# Patient Record
Sex: Male | Born: 1950
Health system: Southern US, Community
[De-identification: ages and names within clinical notes are randomized; demographics above are authoritative.]

## PROBLEM LIST (undated history)

## (undated) DIAGNOSIS — I10 Essential (primary) hypertension: Secondary | ICD-10-CM

## (undated) DIAGNOSIS — H3530 Unspecified macular degeneration: Secondary | ICD-10-CM

## (undated) DIAGNOSIS — L409 Psoriasis, unspecified: Secondary | ICD-10-CM

## (undated) DIAGNOSIS — T7840XA Allergy, unspecified, initial encounter: Secondary | ICD-10-CM

## (undated) DIAGNOSIS — K5190 Ulcerative colitis, unspecified, without complications: Secondary | ICD-10-CM

## (undated) DIAGNOSIS — H269 Unspecified cataract: Secondary | ICD-10-CM

## (undated) DIAGNOSIS — B019 Varicella without complication: Secondary | ICD-10-CM

## (undated) HISTORY — DX: Unspecified cataract: H26.9

## (undated) HISTORY — PX: ACNE CYST REMOVAL: SUR1112

## (undated) HISTORY — DX: Varicella without complication: B01.9

## (undated) HISTORY — DX: Unspecified macular degeneration: H35.30

## (undated) HISTORY — DX: Ulcerative colitis, unspecified, without complications: K51.90

## (undated) HISTORY — DX: Essential (primary) hypertension: I10

## (undated) HISTORY — DX: Allergy, unspecified, initial encounter: T78.40XA

## (undated) HISTORY — DX: Psoriasis, unspecified: L40.9

## (undated) HISTORY — PX: COLONOSCOPY: SHX174

## (undated) HISTORY — PX: TONSILLECTOMY: SHX5217

## (undated) HISTORY — PX: EYE SURGERY: SHX253

---

## 1986-11-14 HISTORY — PX: FOOT SURGERY: SHX648

## 2001-09-30 ENCOUNTER — Emergency Department (HOSPITAL_COMMUNITY): Admission: EM | Admit: 2001-09-30 | Discharge: 2001-10-01 | Payer: Self-pay | Admitting: Emergency Medicine

## 2004-10-21 ENCOUNTER — Ambulatory Visit: Payer: Self-pay | Admitting: Internal Medicine

## 2005-06-28 ENCOUNTER — Ambulatory Visit: Payer: Self-pay | Admitting: Internal Medicine

## 2005-07-05 ENCOUNTER — Ambulatory Visit: Payer: Self-pay | Admitting: Internal Medicine

## 2005-07-15 ENCOUNTER — Ambulatory Visit: Payer: Self-pay

## 2005-11-14 HISTORY — PX: SHOULDER ARTHROSCOPY: SHX128

## 2005-12-13 ENCOUNTER — Ambulatory Visit: Payer: Self-pay | Admitting: Internal Medicine

## 2006-09-01 ENCOUNTER — Ambulatory Visit: Payer: Self-pay | Admitting: Internal Medicine

## 2006-09-01 LAB — CONVERTED CEMR LAB
AST: 18 units/L (ref 0–37)
Albumin: 4.1 g/dL (ref 3.5–5.2)
BUN: 15 mg/dL (ref 6–23)
Basophils Absolute: 0 10*3/uL (ref 0.0–0.1)
CO2: 29 meq/L (ref 19–32)
Calcium: 9.3 mg/dL (ref 8.4–10.5)
Chol/HDL Ratio, serum: 4.1
Glomerular Filtration Rate, Af Am: 112 mL/min/{1.73_m2}
Glucose, Bld: 90 mg/dL (ref 70–99)
Ketones, ur: NEGATIVE mg/dL
LDL Cholesterol: 95 mg/dL (ref 0–99)
Leukocytes, UA: NEGATIVE
Lymphocytes Relative: 13.2 % (ref 12.0–46.0)
MCV: 90.8 fL (ref 78.0–100.0)
Monocytes Absolute: 0.4 10*3/uL (ref 0.2–0.7)
Monocytes Relative: 8.4 % (ref 3.0–11.0)
Neutrophils Relative %: 77 % (ref 43.0–77.0)
Platelets: 163 10*3/uL (ref 150–400)
Potassium: 4 meq/L (ref 3.5–5.1)
Specific Gravity, Urine: >=1.03 (ref 1.000–1.03)
Total Bilirubin: 1 mg/dL (ref 0.3–1.2)
Total Protein, Urine: NEGATIVE mg/dL
Total Protein: 6.4 g/dL (ref 6.0–8.3)
Urine Glucose: NEGATIVE mg/dL
Urobilinogen, UA: 0.2 (ref 0.0–1.0)

## 2006-09-08 ENCOUNTER — Ambulatory Visit: Payer: Self-pay | Admitting: Internal Medicine

## 2007-01-24 ENCOUNTER — Ambulatory Visit: Payer: Self-pay | Admitting: Internal Medicine

## 2007-01-29 ENCOUNTER — Ambulatory Visit: Payer: Self-pay | Admitting: Internal Medicine

## 2007-08-20 ENCOUNTER — Encounter: Payer: Self-pay | Admitting: *Deleted

## 2007-08-20 DIAGNOSIS — I10 Essential (primary) hypertension: Secondary | ICD-10-CM

## 2007-08-20 DIAGNOSIS — L408 Other psoriasis: Secondary | ICD-10-CM

## 2007-08-20 DIAGNOSIS — K5190 Ulcerative colitis, unspecified, without complications: Secondary | ICD-10-CM

## 2008-02-08 ENCOUNTER — Ambulatory Visit: Payer: Self-pay | Admitting: Internal Medicine

## 2008-11-25 ENCOUNTER — Ambulatory Visit: Payer: Self-pay | Admitting: Internal Medicine

## 2008-11-25 LAB — CONVERTED CEMR LAB
ALT: 50 units/L (ref 0–53)
Basophils Absolute: 0 10*3/uL (ref 0.0–0.1)
Basophils Relative: 0.3 % (ref 0.0–3.0)
Calcium: 9.1 mg/dL (ref 8.4–10.5)
Cholesterol: 163 mg/dL (ref 0–200)
Eosinophils Relative: 1.5 % (ref 0.0–5.0)
GFR calc Af Amer: 98 mL/min
GFR calc non Af Amer: 81 mL/min
Glucose, Bld: 108 mg/dL — ABNORMAL HIGH (ref 70–99)
HCT: 45.6 % (ref 39.0–52.0)
HDL: 32.9 mg/dL — ABNORMAL LOW (ref >39.0)
Hemoglobin, Urine: NEGATIVE
LDL Cholesterol: 107 mg/dL — ABNORMAL HIGH (ref 0–99)
Leukocytes, UA: NEGATIVE
Lymphocytes Relative: 15.1 % (ref 12.0–46.0)
MCV: 87.5 fL (ref 78.0–100.0)
Monocytes Absolute: 0.7 10*3/uL (ref 0.1–1.0)
Neutro Abs: 4.7 10*3/uL (ref 1.4–7.7)
Nitrite: NEGATIVE
PSA: 2.33 ng/mL (ref 0.10–4.00)
Platelets: 141 10*3/uL — ABNORMAL LOW (ref 150–400)
Total CHOL/HDL Ratio: 5
Total Protein: 6.9 g/dL (ref 6.0–8.3)
VLDL: 24 mg/dL (ref 0–40)
WBC: 6.5 10*3/uL (ref 4.5–10.5)

## 2008-12-02 ENCOUNTER — Ambulatory Visit: Payer: Self-pay | Admitting: Internal Medicine

## 2008-12-02 DIAGNOSIS — E669 Obesity, unspecified: Secondary | ICD-10-CM

## 2009-09-11 ENCOUNTER — Ambulatory Visit: Payer: Self-pay | Admitting: Internal Medicine

## 2009-09-11 DIAGNOSIS — R05 Cough: Secondary | ICD-10-CM

## 2009-10-02 ENCOUNTER — Ambulatory Visit: Payer: Self-pay | Admitting: Internal Medicine

## 2009-10-29 ENCOUNTER — Telehealth: Payer: Self-pay | Admitting: Internal Medicine

## 2009-11-27 ENCOUNTER — Ambulatory Visit: Payer: Self-pay | Admitting: Internal Medicine

## 2009-11-27 LAB — CONVERTED CEMR LAB
ALT: 38 units/L (ref 0–53)
Alkaline Phosphatase: 65 units/L (ref 39–117)
BUN: 19 mg/dL (ref 6–23)
Bilirubin, Direct: 0.1 mg/dL (ref 0.0–0.3)
Calcium: 8.5 mg/dL (ref 8.4–10.5)
Chloride: 110 meq/L (ref 96–112)
Cholesterol: 154 mg/dL (ref 0–200)
Creatinine, Ser: 0.9 mg/dL (ref 0.40–1.50)
Glucose, Bld: 82 mg/dL (ref 70–99)
HCT: 46.6 % (ref 39.0–52.0)
HDL: 38 mg/dL — ABNORMAL LOW (ref >39)
Hemoglobin: 15.5 g/dL (ref 13.0–17.0)
Leukocytes, UA: NEGATIVE
MCV: 89.1 fL (ref 78.0–100.0)
Platelets: 127 10*3/uL — ABNORMAL LOW (ref 150.0–400.0)
Sodium: 148 meq/L — ABNORMAL HIGH (ref 135–145)
TSH: 1.99 microintl units/mL (ref 0.35–5.50)
Total CHOL/HDL Ratio: 4.1
Total Protein, Urine: NEGATIVE mg/dL
Total Protein: 7.2 g/dL (ref 6.0–8.3)
Triglycerides: 119 mg/dL (ref <150)
Urobilinogen, UA: 1 (ref 0.0–1.0)
WBC: 6.5 10*3/uL (ref 4.5–10.5)
pH: 6.5 (ref 5.0–8.0)

## 2009-12-04 ENCOUNTER — Ambulatory Visit: Payer: Self-pay | Admitting: Internal Medicine

## 2010-12-14 NOTE — Assessment & Plan Note (Signed)
Summary: CPX / NWS  #-PER WIFE SHINGLE VAC ALSO-WIFE CK'D/INSUR WILL P...   Vital Signs:  Patient profile:   60 year old male Height:      70 inches Weight:      271.75 pounds BMI:     39.13 O2 Sat:      96 % on Room air Temp:     98.3 degrees F oral Pulse rate:   96 / minute BP sitting:   160 / 104  (left arm)  Vitals Entered By: Ernestene Mention (December 04, 2009 1:25 PM)  O2 Flow:  Room air CC: CPX--Needs Zosta Vacc/ Pt denies symptoms of increased BP/occ. right heel pain ?plantar fascitis./kb Is Patient Diabetic? No Pain Assessment Patient in pain? no       Vision Screening:      Vision Comments: Last eye exam done 11-27-09 per patient./kb  Vision Entered By: Ernestene Mention (December 04, 2009 1:28 PM)   Primary Care Provider:  Neena Rhymes MD  CC:  CPX--Needs Zosta Vacc/ Pt denies symptoms of increased BP/occ. right heel pain ?plantar fascitis./kb.  History of Present Illness: Patient presents for rouinte medical follow up with last CPX 12/10/08. He did have a cold in Oct '10. He has lost 20 lbs since Jan '10. He is noted to have a bump in BP frm 140/88 last viist to 160/104 today. He feels fine.   He has had some right heel pain: worse with initial weight bearing.   He has had an otherwise uneventful year. He did have an opthal exam last month: has incrased macular protein.   Current Medications (verified): 1)  Enalapril-Hydrochlorothiazide 5-12.5 Mg  Tabs (Enalapril-Hydrochlorothiazide) .... Take 1 Tablet By Mouth Once A Day 2)  Ocuvite (Otc) .... Daily 3)  Lutein 48m (Otc) .... Daily 4)  Clobetasol Propionate 0.05 % Oint (Clobetasol Propionate) .... Apply Two Times A Day Needed  Allergies (verified): 1)  ! Sulfa  Past History:  Past Medical History: Last updated: 02010-01-27Current Problems:  HYPERTENSION (ICD-401.9) COLITIS, ULCERATIVE (ICD-556.9) PSORIASIS (ICD-696.1)  Past Surgical History: Last updated: 027-Jan-2010Tonsillectomy Foot surgery  '88 Facial cyst removal '93 Right shoulder arthroscopy '07  Family History: Last updated: 001/27/10father- deceased @ 73 CAD/MI mother -deceased @ 866 CAD, Breast cancer Neg - prostate or colon cancer: DM  Social History: Last updated: 0Jan 27, 2010HSG Married '71 1 daughter - '78; 1 grandchild '08 - daughter in midst of divorce ('10) work: [employer] with [employer]  Review of Systems  The patient denies anorexia, fever, weight loss, weight gain, decreased hearing, hoarseness, chest pain, dyspnea on exertion, prolonged cough, abdominal pain, hematochezia, incontinence, muscle weakness, transient blindness, difficulty walking, unusual weight change, abnormal bleeding, and angioedema.    Physical Exam  General:  alert, well-developed, well-nourished, and well-hydrated.   Head:  normocephalic, atraumatic, and no abnormalities observed.   Eyes:  vision grossly intact, pupils equal, pupils round, corneas and lenses clear, and no injection.   Ears:  R ear normal and L ear normal.   Nose:  no external deformity and no external erythema.   Mouth:  good dentition, no gingival abnormalities, pharynx pink and moist, and no posterior lymphoid hypertrophy.   Neck:  full ROM, no thyromegaly, and no carotid bruits.   Chest Wall:  no deformities and no masses.   Breasts:  gynecomastia.   Lungs:  Normal respiratory effort, chest expands symmetrically. Lungs are clear to auscultation, no crackles or wheezes. Heart:  Normal rate  and regular rhythm. S1 and S2 normal without gallop, murmur, click, rub or other extra sounds. Abdomen:  Obese, soft, non-tender, normal bowel sounds, no masses, no rebound tenderness, and no hepatomegaly.   Rectal:  no external abnormalities, no hemorrhoids, and normal sphincter tone.   Prostate:  Prostate gland firm and smooth, no enlargement, nodularity, tenderness, mass, asymmetry or induration. Msk:  normal ROM, no joint tenderness, no joint swelling, no redness  over joints, no joint deformities, and no joint instability.   Pulses:  2+ radial and DP pulses Extremities:  No clubbing, cyanosis, edema, or deformity noted with normal full range of motion of all joints.   Neurologic:  No cranial nerve deficits noted. Station and gait are normal. Plantar reflexes are down-going bilaterally. DTRs are symmetrical throughout. Sensory, motor and coordinative functions appear intact. Skin:  Intact without suspicious lesions or rashesno ulcerations and no edema.   Cervical Nodes:  no anterior cervical adenopathy and no posterior cervical adenopathy.   Inguinal Nodes:  no R inguinal adenopathy and no L inguinal adenopathy.   Psych:  Oriented X3, memory intact for recent and remote, normally interactive, and good eye contact.     Impression & Recommendations:  Problem # 1:  OBESITY, CLASS II (ICD-278.00) He has lost 2lbs since his last office visit - great work.  Plan - continue weight management via smart food choices and portion size.           goal - to loose 1.5-2 lbs/month  Problem # 2:  HYPERTENSION (ICD-401.9)  His updated medication list for this problem includes:    Enalapril-hydrochlorothiazide 5-12.5 Mg Tabs (Enalapril-hydrochlorothiazide) .Marland Kitchen... Take 1 tablet by mouth once a day  BP today: 160/104 Prior BP: 140/88 (09/11/2009)  Labs Reviewed: K+: 4.4 (11/27/2009) Creat: : 0.90 (11/27/2009)  Marked elevation today with generally better control.  Plan - no change in medication.           Home BP monitoring with patient to report back: for SBP 140+ on a regular basis will need to adjust medication - add BB or CCB  Problem # 3:  COLITIS, ULCERATIVE (ICD-556.9) Stable with no flares or symptoms  Problem # 4:  PSORIASIS (ICD-696.1) Psoriasis - stable  Problem # 5:  Preventive Health Care (ICD-V70.0) normal history and normal exam. Patient current with colorectal cancer screening with last study in '05; current with prostate cancer screening.  He is up to date with immunizations for tetnus, shingles and pneumonia. He is making good gains with weight control and will be working on his exercise program.  In summary - a pleasant gentleman who seems to be in good health with the exception of his blood pressure with plans for monitoring as above.   Complete Medication List: 1)  Enalapril-hydrochlorothiazide 5-12.5 Mg Tabs (Enalapril-hydrochlorothiazide) .... Take 1 tablet by mouth once a day 2)  Ocuvite (otc)  .... Daily 3)  Lutein 68m (otc)  .... Daily 4)  Clobetasol Propionate 0.05 % Oint (Clobetasol propionate) .... Apply two times a day needed  Other Orders: Zoster (Shingles) Vaccine Live ([id number] Admin 1st Vaccine ([phone]  Patient: JLilli FewNote: All result statuses are Final unless otherwise noted.  Tests: (1) CBC Platelet w/Diff (CBCD)   White Cell Count          6.5 K/uL                    4.5-10.5   Red Cell Count  5.23 Mil/uL                 4.22-5.81   Hemoglobin                15.5 g/dL                   13.0-17.0   Hematocrit                46.6 %                      39.0-52.0   MCV                       89.1 fl                     78.0-100.0   MCHC                      33.3 g/dL                   30.0-36.0   RDW                       12.3 %                      11.5-14.6   Platelet Count       [L]  127.0 K/uL                  150.0-400.0   Neutrophil %              72.4 %                      43.0-77.0   Lymphocyte %              16.4 %                      12.0-46.0   Monocyte %                9.6 %                       3.0-12.0   Eosinophils%              1.2 %                       0.0-5.0   Basophils %               0.4 %                       0.0-3.0   Neutrophill Absolute      4.7 K/uL                    1.4-7.7   Lymphocyte Absolute       1.1 K/uL                    0.7-4.0   Monocyte Absolute         0.6 K/uL                    0.1-1.0  Eosinophils, Absolute  0.1  K/uL                    0.0-0.7   Basophils Absolute        0.0 K/uL                    0.0-0.1  Tests: (2) TSH (TSH)   FastTSH                   1.99 uIU/mL                 0.35-5.50  Tests: (3) Prostate Specific Antigen (PSA)   PSA-Hyb                   1.46 ng/mL                  0.10-4.00  Tests: (4) UDip Only (UDIP)   Color                     YELLOW       RANGE:  Yellow;Lt. Yellow   Clarity                   CLEAR                       Clear   Specific Gravity          1.015                       1.000 - 1.030   Urine Ph                  6.5                         5.0-8.0   Protein                   NEGATIVE                    Negative   Urine Glucose             NEGATIVE                    Negative   Ketones                   NEGATIVE                    Negative   Urine Bilirubin           NEGATIVE                    Negative   Blood                     NEGATIVE                    Negative   Urobilinogen              1.0                         0.0 - 1.0   Leukocyte Esterace        NEGATIVE                    Negative   Nitrite  NEGATIVE                    Negative  Tests: (1) Basic Metabolic Panel (82993)   Sodium               [H]  148 mEq/L                   135-145     Result repeated and verified.   Potassium                 4.4 mEq/L                   3.5-5.3   Chloride                  110 mEq/L                   96-112   CO2                  [L]  14 mEq/L                    19-32     Result repeated and verified.   Glucose                   82 mg/dL                    70-99   BUN                       19 mg/dL                    6-23   Creatinine                0.90 mg/dL                  0.40-1.50   Calcium                   8.5 mg/dL                   8.4-10.5  Tests: (2) Lipid Profile (71696)   Cholesterol               154 mg/dL                   0-200     ATP III Classification:           < 200        mg/dL        Desirable          200 -  239     mg/dL        Borderline High          >= 240        mg/dL        High         Triglyceride              119 mg/dL                   <150   HDL Cholesterol      [L]  38 mg/dL                    >39   Total Chol/HDL Ratio      4.1 Ratio  VLDL Cholesterol (Calc)                             24 mg/dL                    0-40  LDL Cholesterol (Calc)                             92 mg/dL                    0-99Prescriptions: ENALAPRIL-HYDROCHLOROTHIAZIDE 5-12.5 MG  TABS (ENALAPRIL-HYDROCHLOROTHIAZIDE) Take 1 tablet by mouth once a day  #90 x 3   Entered and Authorized by:   Neena Rhymes MD   Signed by:   Neena Rhymes MD on 12/04/2009   Method used:   Faxed to ...       [facility] (mail-order)       [place]  [address]       Ph: [phone]       Fax: [phone]   RxID:   [id number] ENALAPRIL-HYDROCHLOROTHIAZIDE 5-12.5 MG  TABS (ENALAPRIL-HYDROCHLOROTHIAZIDE) Take 1 tablet by mouth once a day  #90 x 3   Entered and Authorized by:   Neena Rhymes MD   Signed by:   Neena Rhymes MD on 12/04/2009   Method used:   Electronically to        [facility] (retail)       [address]       [place], [place]  [address]       Ph: [phone] or [phone]       Fax: [phone]   RxID:   [id number] CLOBETASOL PROPIONATE 0.05 % OINT (CLOBETASOL PROPIONATE) apply two times a day needed  #60 x 1   Entered and Authorized by:   Neena Rhymes MD   Signed by:   Neena Rhymes MD on 12/04/2009   Method used:   Electronically to        [facility] [phone]* (retail)       [address]       [place], [place]  [address]       Ph: [phone] or [phone]       Fax: [phone]   RxID:   [id number]     Immunizations Administered:  Zostavax # 1:    Vaccine Type: Zostavax    Site: left arm    Mfr: Merck    Dose: 0.5 ml    Route: [place]    Given by: Ami Bullins CMA    Exp. Date: 12/11/2010    Lot #: [id number]    VIS given:  08/26/05 given December 04, 2009.  Not Administered:    Influenza Vaccine # 1 not given due to: Patient already had in fall

## 2011-01-04 ENCOUNTER — Telehealth: Payer: Self-pay | Admitting: Internal Medicine

## 2011-01-11 NOTE — Progress Notes (Signed)
  Phone Note Refill Request Message from:  Fax from Pharmacy on January 04, 2011 4:36 PM  Refills Requested: Medication #1:  ENALAPRIL-HYDROCHLOROTHIAZIDE 5-12.5 MG  TABS Take 1 tablet by mouth once a day Initial call taken by: Ami Bullins CMA,  January 04, 2011 4:36 PM    Prescriptions: ENALAPRIL-HYDROCHLOROTHIAZIDE 5-12.5 MG  TABS (ENALAPRIL-HYDROCHLOROTHIAZIDE) Take 1 tablet by mouth once a day  #90 x 3   Entered by:   Ami Bullins CMA   Authorized by:   Neena Rhymes MD   Signed by:   Charlynne Cousins CMA on 01/04/2011   Method used:   Faxed to ...       [facility] ([facility]order)       [address] [place]  [address]       Ph: [phone]       Fax: [phone]   RxID:   [id number]

## 2011-04-01 NOTE — Assessment & Plan Note (Signed)
[facility]                             PRIMARY CARE OFFICE NOTE   BRICK, KETCHER                       MRN:          [id number]  DATE:09/09/2006                            DOB:          10/30/1951    Edward Goodwin is a pleasant 60 year old Caucasian gentleman who presents for a  follow up evaluation and exam.  He was last seen in the office December 13, 2005, for upper respiratory infection.  The patient's last physical exam was  July 05, 2005.  In the interval, the patient has been seen by Dr. Lorin Mercy  and has undergone arthroscopy and repair of his right shoulder.  The patient  reports he has improved range of motion and has done well with his surgery.  The patient otherwise has done well in the interval with no new medical  problems.  He continues to follow with Dr. Lavonna Monarch for his psoriasis.   PAST MEDICAL HISTORY:   SURGICAL:  1. Tonsillectomy remote.  2. Foot surgery in 1998.  3. Facial cyst removed in 1993.  4. Arthroscopy to right shoulder in 2007.   MEDICAL:  1. Usual childhood diseases.  2. Possible ulcerative colitis in the past.  3. Psoriasis.  4. Hypertension.   MEDICATIONS:  1. __________ 5/12.5 once daily.  2. Methotrexate 2.5 mg 4 tabs once weekly.  3. Clobetasol ointment.   HABITS:  Tobacco none, alcohol rare.   DRUG ALLERGIES:  SULFA.   FAMILY HISTORY:  Father had heart disease and died at age 45.  Mother had  cardiovascular disease.  She also had breast cancer.  The patient has a  brother in good health.  The family history is positive for hypertension and  arthritis.   SOCIAL HISTORY:  The patient is married 35 years.  He has 1 daughter.  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  Of note, the  patient's daughter is now in her second marriage, and this seems to be  hitting the rocks a little bit.   REVIEW OF SYSTEMS:  The patient has had a 51 pound weight loss that was  intentional.  He managed this by cutting out sweets, reducing starch,  reducing portion sizes, and continuing to have unlimited amount of activity.  He was given tremendous positive reinforcement for this excellent weight  loss progressive.  The patient has not had an eye exam in more than 24  months but has no visual changes.  The patient is concerned he may have a  blocked right naris and/or sinus.  No cardiovascular, respiratory, GI, or GU  complaints otherwise.  The patient has minor knee discomfort.  No  dermatologic changes.  No neurologic complaints.   PHYSICAL EXAMINATION:  VITAL SIGNS:  Temperature was 98.2, blood pressure  123/78.  Pulse 59, weight 233.  GENERAL APPEARANCE:  This is a well-nourished, well-developed gentleman,  mildly overweight, in no acute distress.  HEENT EXAM:  Normocephalic, atraumatic.  EACs and TMs were unremarkable.  Oropharynx with native dentition in good repair.  No buccal or palatal  lesions  were noted.  Posterior pharynx was clear.  Conjunctivae and sclerae  were clear.  PERRLA.  EOMI.  Funduscopic exam with hand-held instrument  revealed normal disc margin with no vascular abnormalities.  NECK:  Supple without thyromegaly, nodes.  No adenopathy was noted in the  cervical, supraclavicular, or inguinal regions.  CHEST:  No CVA tenderness.  The patient has mild gynecomastia secondary to  weight.  LUNGS:  Clear with no rales, wheezes, or rhonchi.  CARDIOVASCULAR:  2+ radial pulse.  No JVD or carotid bruits.  He had a quiet  precordium with regular rate and rhythm without murmurs, rubs, or gallops.  ABDOMEN:  Soft, no guarding or rebound.  No organosplenomegaly was noted.  GENITALIA:  Uncircumcised male, bilaterally descended testicles without  masses.  RECTAL EXAM:  Normal sphincter tone was noted.  Prostate was smooth, of  normal size and contour without nodules.  EXTREMITIES:  Without clubbing, cyanosis, edema, or deformity.  NEUROLOGIC EXAM:   Nonfocal.   SHORT REVIEW:  The patient is status post colonoscopy, July 13, 2004 with  1 small polyp in a patient with 5 year recall.  Last note from Dr. Lorin Mercy  indicates the patient has done well and made a good recovery from his  surgery.  The patient had an exercise treadmill, July 15, 2005, which  was an unremarkable study with no evidence of ischemia.   DATA BASE:  Hemoglobin 14.6 g, white count 4900 with a normal differential,  cholesterol 142, triglycerides 58, HDL 35, LDL 95.  Chemistries reveal  normal electrolytes.  Glucose was 90, creatinine was 0.9, BUN 15, SGOT 18,  SGPT 21, alkaline phosphatase 67, albumin 4.1.  Thyroid function normal with  a TSH of 1.14.  PSA was normal at 1.26.  Urinalysis was negative.   ASSESSMENT/PLAN:  1. Hypertension.  The patient's blood pressure is well controlled on his      present medications.  He will continue the same.  2. Psoriasis, stable, and the patient sees Dr. Denna Haggard on an as needed      basis.  3. Gastrointestinal.  The patient with no active complaints or problems at      this time.  4. MSK.  The patient with history of recent arthroscopy for his right      shoulder with improved range of motion, and the patient is doing well      at this time.  5. Health maintenance.  The patient is current with colorectal cancer      screening, prostate screening.   In summary, he is a very pleasant gentleman who has done a fabulous job of  losing weight over the last year.  I have encouraged him to continue with  his present program, and he should continue to see some weight loss,  although at a slower rate.  I would suggest a goal for him of 220.   The patient is asked to return to see me on a p.r.n. basis.    ______________________________  Edward Knuckles Norins, MD    MEN/MedQ  DD: 09/09/2006  DT: 09/11/2006  Job #: [id number]   cc:   [address], [place] [address] Edward Goodwin Stuart O. Denna Haggard, M.D.

## 2011-10-03 ENCOUNTER — Ambulatory Visit (INDEPENDENT_AMBULATORY_CARE_PROVIDER_SITE_OTHER): Payer: Managed Care, Other (non HMO)

## 2011-10-03 DIAGNOSIS — Z23 Encounter for immunization: Secondary | ICD-10-CM

## 2011-10-28 ENCOUNTER — Encounter: Payer: Self-pay | Admitting: Internal Medicine

## 2011-10-28 ENCOUNTER — Ambulatory Visit (INDEPENDENT_AMBULATORY_CARE_PROVIDER_SITE_OTHER): Payer: Managed Care, Other (non HMO) | Admitting: Internal Medicine

## 2011-10-28 VITALS — BP 152/80 | HR 73 | Temp 97.8°F | Ht 70.0 in | Wt 286.0 lb

## 2011-10-28 DIAGNOSIS — I10 Essential (primary) hypertension: Secondary | ICD-10-CM

## 2011-10-28 DIAGNOSIS — J209 Acute bronchitis, unspecified: Secondary | ICD-10-CM

## 2011-10-28 DIAGNOSIS — L408 Other psoriasis: Secondary | ICD-10-CM

## 2011-10-28 MED ORDER — HYDROCODONE-HOMATROPINE 5-1.5 MG/5ML PO SYRP: 5.0000 mL | ORAL_SOLUTION | Freq: Four times a day (QID) | ORAL | Status: AC | PRN

## 2011-10-28 MED ORDER — CLOBETASOL PROPIONATE 0.05 % EX OINT: 1.0000 "application " | TOPICAL_OINTMENT | Freq: Two times a day (BID) | CUTANEOUS | Status: DC

## 2011-10-28 MED ORDER — AZITHROMYCIN 250 MG PO TABS: ORAL_TABLET | ORAL | Status: AC

## 2011-10-28 NOTE — Patient Instructions (Signed)
Take all new medications as prescribed Continue all other medications as before  

## 2011-10-29 ENCOUNTER — Encounter: Payer: Self-pay | Admitting: Internal Medicine

## 2011-10-29 NOTE — Progress Notes (Signed)
  Subjective:    Patient ID: Edward Goodwin, male    DOB: [birth year], 60 y.o.   MRN: [id number]  HPI  Here with acute onset mild to mod 2-3 days ST, HA, general weakness and malaise, with prod cough greenish sputum, but Pt denies chest pain, increased sob or doe, wheezing, orthopnea, PND, increased LE swelling, palpitations, dizziness or syncope.  Missed 3 days of BP med, but normally is compliant with meds.  Pt denies new neurological symptoms such as new headache, or facial or extremity weakness or numbness   Pt denies polydipsia, polyuria.  Does have worsesning mild left leg anterior psoriatic plaque with itching and requests tx, out of temovate for some time which worked well previusly.   No other acute complaints Past Medical History  Diagnosis Date  . Hypertension   . Colitis, ulcerative   . Psoriasis    Past Surgical History  Procedure Date  . Tonsillectomy   . Acne cyst removal   . Shoulder arthroscopy 2007    Right  . Foot surgery 1988    reports that he has never smoked. He does not have any smokeless tobacco history on file. His alcohol and drug histories not on file. family history includes Cancer in his mother and Heart disease in his father and mother.  There is no history of Diabetes. Allergies  Allergen Reactions  . Sulfonamide Derivatives    Current Outpatient Prescriptions on File Prior to Visit  Medication Sig Dispense Refill  . beta carotene w/minerals (OCUVITE) tablet Take 1 tablet by mouth daily.        . Enalapril-Hydrochlorothiazide 5-12.5 MG per tablet Take 1 tablet by mouth daily.         Review of Systems Review of Systems  Constitutional: Negative for diaphoresis and unexpected weight change.  HENT: Negative for drooling and tinnitus.   Eyes: Negative for photophobia and visual disturbance.  Respiratory: Negative for choking and stridor.   Gastrointestinal: Negative for vomiting and blood in stool.  Genitourinary: Negative for hematuria and decreased  urine volume.      Objective:   Physical Exam BP 152/80  Pulse 73  Temp(Src) 97.8 F (36.6 C) (Oral)  Ht 5' 10"  (1.778 m)  Wt 286 lb (129.729 kg)  BMI 41.04 kg/m2  SpO2 96% Physical Exam  VS noted, mild ill Constitutional: Pt appears well-developed and well-nourished.  HENT: Head: Normocephalic.  Right Ear: External ear normal.  Left Ear: External ear normal.  Bilat tm's mild erythema.  Sinus nontender.  Pharynx mild erythema Eyes: Conjunctivae and EOM are normal. Pupils are equal, round, and reactive to light.  Neck: Normal range of motion. Neck supple.  Cardiovascular: Normal rate and regular rhythm.   Pulmonary/Chest: Effort normal and breath sounds normal.  Neurological: Pt is alert. No cranial nerve deficit.  Skin: Skin is warm. No erythema. except for large psoriatic plaque to mid/distal LLE anteriorly without tender, swelling Psychiatric: Pt behavior is normal. Thought content normal.     Assessment & Plan:

## 2011-10-29 NOTE — Assessment & Plan Note (Signed)
stable overall by hx and exam, most recent data reviewed with pt, and pt to continue medical treatment as before, to re-start med  BP Readings from Last 3 Encounters:  10/28/11 152/80  12/04/09 160/104  09/11/09 140/88

## 2011-10-29 NOTE — Assessment & Plan Note (Signed)
Mild to mod, for temovate course,  to f/u any worsening symptoms or concerns

## 2011-10-29 NOTE — Assessment & Plan Note (Signed)
Mild to mod, for antibx course,  to f/u any worsening symptoms or concerns 

## 2012-03-02 ENCOUNTER — Ambulatory Visit (INDEPENDENT_AMBULATORY_CARE_PROVIDER_SITE_OTHER): Payer: Managed Care, Other (non HMO) | Admitting: Endocrinology

## 2012-03-02 ENCOUNTER — Encounter: Payer: Self-pay | Admitting: Endocrinology

## 2012-03-02 VITALS — BP 140/80 | HR 60 | Temp 97.5°F | Ht 70.0 in | Wt 280.4 lb

## 2012-03-02 DIAGNOSIS — H6690 Otitis media, unspecified, unspecified ear: Secondary | ICD-10-CM

## 2012-03-02 MED ORDER — NEOMYCIN-POLYMYXIN-HC 1 % OT SOLN: 3.0000 [drp] | Freq: Four times a day (QID) | OTIC | Status: DC

## 2012-03-02 MED ORDER — CEFUROXIME AXETIL 500 MG PO TABS: 500.0000 mg | ORAL_TABLET | Freq: Two times a day (BID) | ORAL | Status: AC

## 2012-03-02 NOTE — Patient Instructions (Addendum)
i have sent 2 prescriptions to your pharmacy: for an antibiotic, and drops I hope you feel better soon.  If you don't feel better by next week, please call back.

## 2012-03-02 NOTE — Progress Notes (Signed)
  Subjective:    Patient ID: Edward Goodwin, male    DOB: 1951-05-24, 61 y.o.   MRN: [id number]  HPI Pt states 1 day of right ear pain, and assoc "blockage."  He also has ear itching. Past Medical History  Diagnosis Date  . Hypertension   . Colitis, ulcerative   . Psoriasis     Past Surgical History  Procedure Date  . Tonsillectomy   . Acne cyst removal   . Shoulder arthroscopy 2007    Right  . Foot surgery 1988    History   Social History  . Marital Status: Married    Spouse Name: N/A    Number of Children: N/A  . Years of Education: N/A   Occupational History  . Not on file.   Social History Main Topics  . Smoking status: Never Smoker   . Smokeless tobacco: Not on file  . Alcohol Use: Not on file  . Drug Use: Not on file  . Sexually Active: Not on file   Other Topics Concern  . 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 beta carotene w/minerals (OCUVITE) tablet Take 1 tablet by mouth daily.        . clobetasol ointment (TEMOVATE) 3.40 % Apply 1 application topically 2 (two) times daily.  30 g  1  . Enalapril-Hydrochlorothiazide 5-12.5 MG per tablet Take 1 tablet by mouth daily.          Allergies  Allergen Reactions  . Sulfonamide Derivatives     Family History  Problem Relation Age of Onset  . Cancer Mother     Breast  . Heart disease Mother   . Heart disease Father     CAD/MI  . Diabetes Neg Hx     BP 140/80  Pulse 60  Temp(Src) 97.5 F (36.4 C) (Oral)  Ht 5' 10"  (1.778 m)  Wt 280 lb 6.4 oz (127.189 kg)  BMI 40.23 kg/m2  SpO2 95%   Review of Systems Denies fever    Objective:   Physical Exam VITAL SIGNS:  See vs page GENERAL: no distress Left tm and eac are red.      Assessment & Plan:  aom and  aoe, new

## 2012-03-07 ENCOUNTER — Other Ambulatory Visit: Payer: Self-pay | Admitting: Internal Medicine

## 2012-03-08 ENCOUNTER — Encounter: Payer: Self-pay | Admitting: Endocrinology

## 2012-03-08 ENCOUNTER — Ambulatory Visit (INDEPENDENT_AMBULATORY_CARE_PROVIDER_SITE_OTHER): Payer: Managed Care, Other (non HMO) | Admitting: Endocrinology

## 2012-03-08 VITALS — BP 130/68 | HR 64 | Temp 97.6°F | Ht 71.0 in | Wt 285.0 lb

## 2012-03-08 DIAGNOSIS — H6690 Otitis media, unspecified, unspecified ear: Secondary | ICD-10-CM

## 2012-03-08 MED ORDER — LEVOFLOXACIN 500 MG PO TABS: 500.0000 mg | ORAL_TABLET | Freq: Every day | ORAL | Status: AC

## 2012-03-08 NOTE — Patient Instructions (Addendum)
i have sent a prescription to your pharmacy, for a different antibiotic.   I hope you feel better soon.  If you don't feel better by next week, please call back.

## 2012-03-08 NOTE — Progress Notes (Signed)
  Subjective:    Patient ID: Edward Goodwin, male    DOB: 04-14-1951, 61 y.o.   MRN: [id number]  HPI Since ov here last week, pt still has moderate right otalgia, and assoc "blockage."  Past Medical History  Diagnosis Date  . Hypertension   . Colitis, ulcerative   . Psoriasis     Past Surgical History  Procedure Date  . Tonsillectomy   . Acne cyst removal   . Shoulder arthroscopy 2007    Right  . Foot surgery 1988    History   Social History  . Marital Status: Married    Spouse Name: N/A    Number of Children: N/A  . Years of Education: N/A   Occupational History  . Not on file.   Social History Main Topics  . Smoking status: Never Smoker   . Smokeless tobacco: Not on file  . Alcohol Use: Not on file  . Drug Use: Not on file  . Sexually Active: Not on file   Other Topics Concern  . 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 beta carotene w/minerals (OCUVITE) tablet Take 1 tablet by mouth daily.        . cefUROXime (CEFTIN) 500 MG tablet Take 1 tablet (500 mg total) by mouth 2 (two) times daily.  14 tablet  0  . clobetasol ointment (TEMOVATE) 4.09 % Apply 1 application topically 2 (two) times daily.  30 g  1  . Enalapril-Hydrochlorothiazide 5-12.5 MG per tablet TAKE 1 TABLET EVERY DAY  90 tablet  0  . NEOMYCIN-POLYMYXIN-HC, OTIC, (CORTISPORIN) 1 % SOLN Place 3 drops into both ears every 6 (six) hours.  20 mL  0    Allergies  Allergen Reactions  . Sulfonamide Derivatives     Family History  Problem Relation Age of Onset  . Cancer Mother     Breast  . Heart disease Mother   . Heart disease Father     CAD/MI  . Diabetes Neg Hx     BP 130/68  Pulse 64  Temp(Src) 97.6 F (36.4 C) (Oral)  Ht 5' 11"  (1.803 m)  Wt 285 lb  (129.275 kg)  BMI 39.75 kg/m2  SpO2 95%   Review of Systems Denies fever    Objective:   Physical Exam VITAL SIGNS:  See vs page GENERAL: no distress Right eac and tm are very red.  No d/c.      Assessment & Plan:  AOM/AOE, persistent

## 2012-03-17 ENCOUNTER — Other Ambulatory Visit: Payer: Self-pay | Admitting: Endocrinology

## 2012-04-03 ENCOUNTER — Other Ambulatory Visit: Payer: Self-pay | Admitting: Endocrinology

## 2012-04-23 ENCOUNTER — Ambulatory Visit (INDEPENDENT_AMBULATORY_CARE_PROVIDER_SITE_OTHER): Payer: Managed Care, Other (non HMO) | Admitting: Internal Medicine

## 2012-04-23 ENCOUNTER — Other Ambulatory Visit (INDEPENDENT_AMBULATORY_CARE_PROVIDER_SITE_OTHER): Payer: Managed Care, Other (non HMO)

## 2012-04-23 ENCOUNTER — Encounter: Payer: Self-pay | Admitting: Internal Medicine

## 2012-04-23 VITALS — BP 124/80 | HR 80 | Temp 97.6°F | Resp 16 | Ht 70.0 in | Wt 282.0 lb

## 2012-04-23 DIAGNOSIS — Z125 Encounter for screening for malignant neoplasm of prostate: Secondary | ICD-10-CM

## 2012-04-23 DIAGNOSIS — Z0000 Encounter for general adult medical examination without abnormal findings: Secondary | ICD-10-CM

## 2012-04-23 DIAGNOSIS — E669 Obesity, unspecified: Secondary | ICD-10-CM

## 2012-04-23 DIAGNOSIS — I10 Essential (primary) hypertension: Secondary | ICD-10-CM

## 2012-04-23 LAB — COMPREHENSIVE METABOLIC PANEL
ALT: 53 U/L (ref 0–53)
Albumin: 4.2 g/dL (ref 3.5–5.2)
CO2: 28 mEq/L (ref 19–32)
Calcium: 9.2 mg/dL (ref 8.4–10.5)
Chloride: 102 mEq/L (ref 96–112)
GFR: 92.98 mL/min (ref >60.00)
Glucose, Bld: 101 mg/dL — ABNORMAL HIGH (ref 70–99)
Potassium: 3.7 mEq/L (ref 3.5–5.1)
Sodium: 140 mEq/L (ref 135–145)
Total Bilirubin: 0.6 mg/dL (ref 0.3–1.2)
Total Protein: 6.9 g/dL (ref 6.0–8.3)

## 2012-04-23 NOTE — Progress Notes (Signed)
Subjective:    Patient ID: Edward Goodwin, male    DOB: [birth year], 61 y.o.   MRN: [id number]  HPI Edward Goodwin is here for annual wellness examination and management of other chronic and acute problems. He has been OK. No major illness, but did have an ear infection recently. No surgery, no injury.   He has intermittent swelling and pain of the right ankle. He can awaken in the AM with a swollen painful ankle. Duration of discomfort 24-48 hrs.  He has a recurrent skin lesion right forearm, scaly in nature. He has new skin tags in the infra-orbital region right.   The risk factors are reflected in the social history.  The roster of all physicians providing medical care to patient - is listed in the Snapshot section of the chart.  Activities of daily living:  The patient is 100% inedpendent in all ADLs: dressing, toileting, feeding as well as independent mobility  Home safety : The patient has smoke detectors in the home. Falls - discussed home fall safety. They wear seatbelts. Declined to answer question of firearm safety. There is no violence in the home.   There is no risks for hepatitis, STDs or HIV. There is no history of blood transfusion. They have no travel history to infectious disease endemic areas of the world.  The patient has seen their dentist in the last six month. They have seen their eye doctor in the last year. They admit to some hearing difficulty and have had audiologic testing in the last 10 year.  They do not  have excessive sun exposure. Discussed the need for sun protection: hats, long sleeves and use of sunscreen if there is significant sun exposure.   Diet: the importance of a healthy diet is discussed. They do have a unhealthy-high fat/fast food diet.  The patient has no regular exercise program.  The benefits of regular aerobic exercise were discussed.  Depression screen: there are no signs or vegative symptoms of depression- irritability, change in appetite,  anhedonia, sadness/tearfullness.  Cognitive assessment: the patient manages all their financial and personal affairs and is actively engaged.   The following portions of the patient's history were reviewed and updated as appropriate: allergies, current medications, past family history, past medical history,  past surgical history, past social history  and problem list.  Vision, hearing, body mass index were assessed and reviewed.   During the course of the visit the patient was educated and counseled about appropriate screening and preventive services including : fall prevention , diabetes screening, nutrition counseling, colorectal cancer screening, and recommended immunizations.  Past Medical History  Diagnosis Date  . Hypertension   . Colitis, ulcerative   . Psoriasis    Past Surgical History  Procedure Date  . Tonsillectomy   . Acne cyst removal   . Shoulder arthroscopy 2007    Right  . Foot surgery 1988   Family History  Problem Relation Age of Onset  . Cancer Mother     Breast  . Heart disease Mother   . Heart disease Father     CAD/MI  . Diabetes Neg Hx    History   Social History  . Marital Status: Married    Spouse Name: N/A    Number of Children: N/A  . Years of Education: N/A   Occupational History  . Not on file.   Social History Main Topics  . Smoking status: Former Research scientist (life sciences)  . Smokeless tobacco: Not on file  . Alcohol Use:  Not on file  . Drug Use: Not on file  . Sexually Active: Not on file   Other Topics Concern  . 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  HEENT:  Negative for hearing loss, ear pain, congestion, neck stiffness and postnasal drip. Negative for sore throat or swallowing  problems. Negative for dental complaints.   Eyes: Negative for vision loss or change in visual acuity.  Respiratory: Negative for chest tightness and wheezing. Negative for DOE.   Cardiovascular: Negative for chest pain or palpitations. No decreased exercise tolerance Gastrointestinal: No change in bowel habit. No bloating or gas. No reflux or indigestion Genitourinary: Negative for urgency, frequency, flank pain and difficulty urinating.  Musculoskeletal: Negative for myalgias, back pain, arthralgias and gait problem.  Neurological: Negative for dizziness, tremors, weakness and headaches.  Hematological: Negative for adenopathy.  Psychiatric/Behavioral: Negative for behavioral problems and dysphoric mood.       Objective:   Physical Exam Filed Vitals:   04/23/12 1452  BP: 124/80  Pulse: 80  Temp: 97.6 F (36.4 C)  Resp: 16   Wt Readings from Last 3 Encounters:  04/23/12 282 lb (127.914 kg)  03/08/12 285 lb (129.275 kg)  03/02/12 280 lb 6.4 oz (127.189 kg)    Gen'l: Well nourished well developed, obese white male in no acute distress  HEENT: Head: Normocephalic and atraumatic. Right Ear: External ear normal. EAC/TM nl. Left Ear: External ear normal.  EAC/TM nl. Nose: Nose normal. Mouth/Throat: Oropharynx is clear and moist. Dentition - native, in good repair. No buccal or palatal lesions. Posterior pharynx clear. Eyes: Conjunctivae and sclera clear. EOM intact. Pupils are equal, round, and reactive to light. Right eye exhibits no discharge. Left eye exhibits no discharge. Neck: Normal range of motion. Neck supple. No JVD present. No tracheal deviation present. No thyromegaly present.  Cardiovascular: Normal rate, regular rhythm, no gallop, no friction rub, no murmur heard.      Quiet precordium. 2+ radial and DP pulses . No carotid bruits Pulmonary/Chest: Effort normal. No respiratory distress or increased WOB, no wheezes, no rales. No chest wall deformity or CVAT.  gynecomastia Abdominal: Soft. Bowel sounds are normal in all quadrants. He exhibits no distension, no tenderness, no rebound or guarding, No heptosplenomegaly  Genitourinary:  deferred Musculoskeletal: Normal range of motion. He exhibits no edema and no tenderness.       Small and large joints without redness, synovial thickening or deformity. Full range of motion preserved about all small, median and large joints.  Lymphadenopathy:    He has no cervical or supraclavicular adenopathy.  Neurological: He is alert and oriented to person, place, and time. CN II-XII intact. DTRs 2+ and symmetrical biceps, radial and patellar tendons. Cerebellar function normal with no tremor, rigidity, normal gait and station.  Skin: Skin is warm and dry. No rash noted. No erythema.  Psychiatric: He has a normal mood and affect. His behavior is normal. Thought content normal.   Lab Results  Component Value Date   WBC 6.5 11/27/2009   HGB 15.5 11/27/2009   HCT 46.6 11/27/2009   PLT 127.0* 11/27/2009   GLUCOSE 101* 04/23/2012   CHOL 154 11/27/2009   TRIG 119 11/27/2009   HDL 38* 11/27/2009   LDLCALC  92 11/27/2009   ALT 53 04/23/2012   AST 38* 04/23/2012   NA 140 04/23/2012   K 3.7 04/23/2012   CL 102 04/23/2012   CREATININE 0.9 04/23/2012   BUN 16 04/23/2012   CO2 28 04/23/2012   TSH 1.99 11/27/2009   PSA 1.66 04/23/2012          Assessment & Plan:

## 2012-04-24 DIAGNOSIS — Z0000 Encounter for general adult medical examination without abnormal findings: Secondary | ICD-10-CM | POA: Insufficient documentation

## 2012-04-24 NOTE — Assessment & Plan Note (Signed)
BP Readings from Last 3 Encounters:  04/23/12 124/80  03/08/12 130/68  03/02/12 140/80   Adequate control on present medications. Labs are normal.  Plan Continue present medication

## 2012-04-24 NOTE — Assessment & Plan Note (Signed)
Discussed with the patient that his obesity was his #1 health threat and that this was a factor that could shorten his life. Discussed weight management: smart food choice - the low calorie option when available; PORTION SIZE CONTROL - using the hand as a guide to portion size; regular aerobic exercise - 3 sessions a week of 30 minutes minimum with a heart rate of 120.  Target weight - 220 Bls Goal - to loose 2 lbs a month ( a 3 year project)

## 2012-04-24 NOTE — Assessment & Plan Note (Signed)
Interval history - negative for any major illness or injury. Physical exam normal except for obesity. Lab results past and present - stable with in normal range results. He is current with colorectal cancer screening. Discussed pros and cons of prostate cancer screening (USPHCTF recommendations reviewed and ACU April '13 recommendations) and he requests evaluation at this time: PSA is normal. He is current with immunizations.  In summary - a nice man who needs to work on his weight. He is otherwise medically stable. He will return in 1 year, sooner as needed.

## 2012-05-27 ENCOUNTER — Other Ambulatory Visit: Payer: Self-pay | Admitting: Internal Medicine

## 2012-05-31 ENCOUNTER — Other Ambulatory Visit: Payer: Self-pay | Admitting: Internal Medicine

## 2012-07-15 DIAGNOSIS — H3530 Unspecified macular degeneration: Secondary | ICD-10-CM | POA: Insufficient documentation

## 2012-07-15 HISTORY — DX: Unspecified macular degeneration: H35.30

## 2012-08-27 ENCOUNTER — Other Ambulatory Visit: Payer: Self-pay | Admitting: Internal Medicine

## 2012-08-31 ENCOUNTER — Other Ambulatory Visit: Payer: Self-pay | Admitting: Internal Medicine

## 2012-09-06 ENCOUNTER — Ambulatory Visit: Payer: Managed Care, Other (non HMO)

## 2012-11-13 ENCOUNTER — Encounter: Payer: Self-pay | Admitting: Internal Medicine

## 2012-11-13 ENCOUNTER — Ambulatory Visit (INDEPENDENT_AMBULATORY_CARE_PROVIDER_SITE_OTHER): Payer: Managed Care, Other (non HMO) | Admitting: Internal Medicine

## 2012-11-13 ENCOUNTER — Other Ambulatory Visit (INDEPENDENT_AMBULATORY_CARE_PROVIDER_SITE_OTHER): Payer: Managed Care, Other (non HMO)

## 2012-11-13 VITALS — BP 130/82 | HR 72 | Temp 98.0°F | Resp 10 | Ht 69.5 in | Wt 248.0 lb

## 2012-11-13 DIAGNOSIS — Z0000 Encounter for general adult medical examination without abnormal findings: Secondary | ICD-10-CM

## 2012-11-13 LAB — LIPID PANEL
Cholesterol: 163 mg/dL (ref 0–200)
HDL: 34.2 mg/dL — ABNORMAL LOW (ref >39.00)
LDL Cholesterol: 108 mg/dL — ABNORMAL HIGH (ref 0–99)
Total CHOL/HDL Ratio: 5
Triglycerides: 104 mg/dL (ref 0.0–149.0)

## 2012-11-13 NOTE — Patient Instructions (Addendum)
Thanks for coming in. You have done a [place] job of weight management loosing 34 lbs in 6 months, lowering the BMI from 40 to 36!!  Have a Happy New Year

## 2012-11-14 NOTE — Progress Notes (Signed)
  Subjective:    Patient ID: Edward Goodwin, male    DOB: 1950/11/28, 62 y.o.   MRN: [id number]  HPI Edward Goodwin presents to have health metrics recorded and for cholesterol testing as part of his employer provided healthcare requirements. He is feeling well and doing well.  Past Medical History  Diagnosis Date  . Hypertension   . Colitis, ulcerative   . Psoriasis   . Macular degeneration, bilateral 07/2012   Past Surgical History  Procedure Date  . Tonsillectomy   . Acne cyst removal   . Shoulder arthroscopy 2007    Right  . Foot surgery 1988   Family History  Problem Relation Age of Onset  . Cancer Mother     Breast  . Heart disease Mother   . Heart disease Father     CAD/MI  . Diabetes Daughter   . Diabetes Maternal Uncle   . Diabetes Paternal Aunt    History   Social History  . Marital Status: Married    Spouse Name: N/A    Number of Children: 1  . Years of Education: 12   Occupational History  . insurance sales    Social History Main Topics  . Smoking status: Former Smoker    Quit date: 10/23/1989  . Smokeless tobacco: Never Used  . Alcohol Use: Yes     Comment: rare glass of wine - less than once a year  . Drug Use: No  . Sexually Active: Not Currently   Other Topics Concern  . 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 [employer] 6 years. Married 1971I daughter 56; 1 grandchild 2008 - daughter divorced 2010. 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 Yes for acute CPR, no for prolonged mechanical ventilatory support, no for prolonged artificial nutrition or other heroic measures leaving him in an incapacitated state.     Current Outpatient Prescriptions on File Prior to Visit  Medication Sig Dispense Refill  . clobetasol ointment (TEMOVATE) 3.42 % APPLY 1 APPLICATION 2 TIMES DAILY  30 g  1  . Enalapril-Hydrochlorothiazide 5-12.5 MG per tablet TAKE 1 TABLET EVERY DAY  90 tablet  3  . beta  carotene w/minerals (OCUVITE) tablet Take 1 tablet by mouth daily.            Review of Systems System review is negative for any constitutional, cardiac, pulmonary, GI or neuro symptoms or complaints other than as described in the HPI.     Objective:   Physical Exam Filed Vitals:   11/13/12 1023  BP: 130/82  Pulse: 72  Temp: 98 F (36.7 C)  Resp: 10   Wt Readings from Last 3 Encounters:  11/13/12 248 lb 0.6 oz (112.51 kg)  04/23/12 282 lb (127.914 kg)  03/08/12 285 lb (129.275 kg)  BMI 36 Waist 46" Gen'l - WNWD weight man in no distress Cor= RRR Pulm normal respirations    Assessment & Plan:  1. Health demographics - done and recorded. Lipid panel ordered.  Addendum: LDL 108 HDL 34

## 2013-09-05 ENCOUNTER — Encounter: Payer: Self-pay | Admitting: Internal Medicine

## 2013-09-05 ENCOUNTER — Ambulatory Visit (INDEPENDENT_AMBULATORY_CARE_PROVIDER_SITE_OTHER): Payer: Managed Care, Other (non HMO) | Admitting: Internal Medicine

## 2013-09-05 ENCOUNTER — Other Ambulatory Visit (INDEPENDENT_AMBULATORY_CARE_PROVIDER_SITE_OTHER): Payer: Managed Care, Other (non HMO)

## 2013-09-05 VITALS — BP 140/80 | HR 64 | Temp 97.9°F | Ht 69.5 in | Wt 245.4 lb

## 2013-09-05 DIAGNOSIS — Z23 Encounter for immunization: Secondary | ICD-10-CM

## 2013-09-05 DIAGNOSIS — I10 Essential (primary) hypertension: Secondary | ICD-10-CM

## 2013-09-05 DIAGNOSIS — Z125 Encounter for screening for malignant neoplasm of prostate: Secondary | ICD-10-CM

## 2013-09-05 DIAGNOSIS — R972 Elevated prostate specific antigen [PSA]: Secondary | ICD-10-CM

## 2013-09-05 DIAGNOSIS — E669 Obesity, unspecified: Secondary | ICD-10-CM

## 2013-09-05 DIAGNOSIS — Z0000 Encounter for general adult medical examination without abnormal findings: Secondary | ICD-10-CM

## 2013-09-05 LAB — LIPID PANEL
HDL: 37.8 mg/dL — ABNORMAL LOW (ref >39.00)
LDL Cholesterol: 105 mg/dL — ABNORMAL HIGH (ref 0–99)
VLDL: 27.6 mg/dL (ref 0.0–40.0)

## 2013-09-05 LAB — COMPREHENSIVE METABOLIC PANEL
ALT: 24 U/L (ref 0–53)
AST: 21 U/L (ref 0–37)
CO2: 31 mEq/L (ref 19–32)
Creatinine, Ser: 0.8 mg/dL (ref 0.4–1.5)
Sodium: 140 mEq/L (ref 135–145)
Total Bilirubin: 1.1 mg/dL (ref 0.3–1.2)
Total Protein: 7.2 g/dL (ref 6.0–8.3)

## 2013-09-05 LAB — HEPATIC FUNCTION PANEL
AST: 21 U/L (ref 0–37)
Albumin: 4.6 g/dL (ref 3.5–5.2)
Alkaline Phosphatase: 63 U/L (ref 39–117)
Total Protein: 7.2 g/dL (ref 6.0–8.3)

## 2013-09-05 LAB — PSA: PSA: 3.42 ng/mL (ref 0.10–4.00)

## 2013-09-05 LAB — HEMOGLOBIN A1C: Hgb A1c MFr Bld: 5 % (ref 4.6–6.5)

## 2013-09-05 MED ORDER — CLOBETASOL PROPIONATE 0.05 % EX OINT: TOPICAL_OINTMENT | CUTANEOUS | Status: DC

## 2013-09-05 MED ORDER — ENALAPRIL-HYDROCHLOROTHIAZIDE 5-12.5 MG PO TABS: ORAL_TABLET | ORAL | Status: DC

## 2013-09-05 NOTE — Progress Notes (Signed)
Subjective:    Patient ID: Edward Goodwin, male    DOB: August 18, 1951, 62 y.o.   MRN: [id number]  HPI [name] presents for routine annual wellness exam. He reports that he has been well, no surgery, no injuries and no hospitalizations. Is current with dentist and has been to eye doctor - dry macular degeneration that is stable. Mostly a healthy diet. No regular exercise program. He is advised of the advantages of regular exercise. He has lost 50 lbs in the last 2 years but watching his portion size.   Past Medical History  Diagnosis Date  . Hypertension   . Colitis, ulcerative   . Psoriasis   . Macular degeneration, bilateral 07/2012   Past Surgical History  Procedure Laterality Date  . Tonsillectomy    . Acne cyst removal    . Shoulder arthroscopy  2007    Right  . Foot surgery  1988   Family History  Problem Relation Age of Onset  . Cancer Mother     Breast  . Heart disease Mother   . Heart disease Father     CAD/MI  . Diabetes Daughter   . Diabetes Maternal Uncle   . Diabetes Paternal Aunt    History   Social History  . Marital Status: Married    Spouse Name: N/A    Number of Children: 1  . Years of Education: 12   Occupational History  . insurance sales    Social History Main Topics  . Smoking status: Former Smoker    Quit date: 10/23/1989  . Smokeless tobacco: Never Used  . Alcohol Use: Yes     Comment: rare glass of wine - less than once a year  . Drug Use: No  . Sexual Activity: Not Currently   Other Topics Concern  . 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 [employer] 6 years. Married 1971   I daughter 74; 1 grandchild 2008 - daughter divorced 2010. 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 Yes for acute CPR, no for prolonged mechanical ventilatory support, no for prolonged artificial nutrition or other heroic measures leaving him in an incapacitated state.     Current Outpatient  Prescriptions on File Prior to Visit  Medication Sig Dispense Refill  . clobetasol ointment (TEMOVATE) 7.82 % APPLY 1 APPLICATION 2 TIMES DAILY  30 g  1  . Enalapril-Hydrochlorothiazide 5-12.5 MG per tablet TAKE 1 TABLET EVERY DAY  90 tablet  3  . Multiple Vitamins-Minerals (PRESERVISION AREDS 2) CAPS Take 1 capsule by mouth 2 (two) times daily before lunch and supper.       No current facility-administered medications on file prior to visit.      Review of Systems Constitutional:  Negative for fever, chills, activity change and unexpected weight change.  HEENT:  positive for hearing loss but not tested this year, ear pain, congestion, neck stiffness and postnasal drip. Negative for sore throat or swallowing problems. Negative for dental complaints.   Eyes: Negative for significant vision loss or change in visual acuity.  Respiratory: Negative for chest tightness and wheezing. Negative for DOE.   Cardiovascular: Negative for chest pain or palpitations. No decreased exercise tolerance Gastrointestinal: No change in bowel habit. No bloating or gas. No reflux or indigestion Genitourinary: Negative for urgency, frequency, flank pain and difficulty urinating.  Musculoskeletal: Negative for myalgias, back pain, arthralgias and gait problem.  Neurological: Negative for dizziness, tremors, and  headaches. Reports weakness which he thinks is low blood sugar related.  Hematological: Negative for adenopathy.  Psychiatric/Behavioral: Negative for behavioral problems and dysphoric mood.       Objective:   Physical Exam Filed Vitals:   09/05/13 0906  BP: 140/80  Pulse: 64  Temp: 97.9 F (36.6 C)   Wt Readings from Last 3 Encounters:  09/05/13 245 lb 6.4 oz (111.313 kg)  11/13/12 248 lb 0.6 oz (112.51 kg)  04/23/12 282 lb (127.914 kg)   Gen'l: Well nourished well developed male in no acute distress  HEENT: Head: Normocephalic and atraumatic. Right Ear: External ear normal. EAC/TM nl. Left  Ear: External ear normal.  EAC/TM nl. Nose: Nose normal. Mouth/Throat: Oropharynx is clear and moist. Dentition - native, in good repair. No buccal or palatal lesions. Posterior pharynx clear. Eyes: Conjunctivae and sclera clear. EOM intact. Pupils are equal, round, and reactive to light. Right eye exhibits no discharge. Left eye exhibits no discharge. Neck: Normal range of motion. Neck supple. No JVD present. No tracheal deviation present. No thyromegaly present.  Cardiovascular: Normal rate, regular rhythm, no gallop, no friction rub, no murmur heard.      Quiet precordium. 2+ radial and DP pulses . No carotid bruits Pulmonary/Chest: Effort normal. No respiratory distress or increased WOB, no wheezes, no rales. No chest wall deformity or CVAT. Abdomen: Soft. Bowel sounds are normal in all quadrants. He exhibits no distension, no tenderness, no rebound or guarding, No heptosplenomegaly  Genitourinary:   Musculoskeletal: Normal range of motion. He exhibits no edema and no tenderness.       Small and large joints without redness, synovial thickening or deformity. Full range of motion preserved about all small, median and large joints.  Lymphadenopathy:    He has no cervical or supraclavicular adenopathy.  Neurological: He is alert and oriented to person, place, and time. CN II-XII intact. DTRs 2+ and symmetrical biceps, radial and patellar tendons. Cerebellar function normal with no tremor, rigidity, normal gait and station.  Skin: Skin is warm and dry. No rash noted. No erythema.  Psychiatric: He has a normal mood and affect. His behavior is normal. Thought content normal.   Recent Results (from the past 2160 hour(s))  HEPATIC FUNCTION PANEL     Status: None   Collection Time    09/05/13  9:59 AM      Result Value Range   Total Bilirubin 1.1  0.3 - 1.2 mg/dL   Bilirubin, Direct 0.2  0.0 - 0.3 mg/dL   Alkaline Phosphatase 63  39 - 117 U/L   AST 21  0 - 37 U/L   ALT 24  0 - 53 U/L   Total  Protein 7.2  6.0 - 8.3 g/dL   Albumin 4.6  3.5 - 5.2 g/dL  COMPREHENSIVE METABOLIC PANEL     Status: Abnormal   Collection Time    09/05/13  9:59 AM      Result Value Range   Sodium 140  135 - 145 mEq/L   Potassium 4.5  3.5 - 5.1 mEq/L   Chloride 101  96 - 112 mEq/L   CO2 31  19 - 32 mEq/L   Glucose, Bld 104 (*) 70 - 99 mg/dL   BUN 15  6 - 23 mg/dL   Creatinine, Ser 0.8  0.4 - 1.5 mg/dL   Total Bilirubin 1.1  0.3 - 1.2 mg/dL   Alkaline Phosphatase 63  39 - 117 U/L   AST 21  0 - 37  U/L   ALT 24  0 - 53 U/L   Total Protein 7.2  6.0 - 8.3 g/dL   Albumin 4.6  3.5 - 5.2 g/dL   Calcium 9.4  8.4 - 10.5 mg/dL   GFR 104.85  >60.00 mL/min  LIPID PANEL     Status: Abnormal   Collection Time    09/05/13  9:59 AM      Result Value Range   Cholesterol 170  0 - 200 mg/dL   Comment: ATP III Classification       Desirable:  < 200 mg/dL               Borderline High:  200 - 239 mg/dL          High:  > = 240 mg/dL   Triglycerides 138.0  0.0 - 149.0 mg/dL   Comment: Normal:  <150 mg/dLBorderline High:  150 - 199 mg/dL   HDL 37.80 (*) >39.00 mg/dL   VLDL 27.6  0.0 - 40.0 mg/dL   LDL Cholesterol 105 (*) 0 - 99 mg/dL   Total CHOL/HDL Ratio 4     Comment:                Men          Women1/2 Average Risk     3.4          3.3Average Risk          5.0          4.42X Average Risk          9.6          7.13X Average Risk          15.0          11.0                      HEMOGLOBIN A1C     Status: None   Collection Time    09/05/13  9:59 AM      Result Value Range   Hemoglobin A1C 5.0  4.6 - 6.5 %   Comment: Glycemic Control Guidelines for People with Diabetes:Non Diabetic:  <6%Goal of Therapy: <7%Additional Action Suggested:  >8%   PSA     Status: None   Collection Time    09/05/13  9:59 AM      Result Value Range   PSA 3.42  0.10 - 4.00 ng/mL         Assessment & Plan:

## 2013-09-05 NOTE — Patient Instructions (Signed)
Thanks for coming in.  Your exam is good. Labs are ordered and will be available on MyChart.  Consider having Prevar 13 pneumonia - can have this given at a nurse visit in 2 weeks.   Keep up the good work on [facility].   Stay well.

## 2013-09-09 DIAGNOSIS — R972 Elevated prostate specific antigen [PSA]: Secondary | ICD-10-CM | POA: Insufficient documentation

## 2013-09-09 NOTE — Assessment & Plan Note (Signed)
Interval history is benign. Physical exam is normal. Lab results reviwed - normal Cmet, Lipd panel with LDL 105, better than goal, HDL 37.8 - close to goal, A1C 5%. He is current with colorectal cancer screening. Discussed pros and cons of prostate cancer screening (USPHCTF recommendations reviewed and ACU April '13 recommendations) and he requests evaluation at this time - PSA 3.42 up from 1.66. Immunizations are up to date.  In summary a very nice man who is medically stable except for rise in PSA.

## 2013-09-09 NOTE — Assessment & Plan Note (Signed)
BP Readings from Last 3 Encounters:  09/05/13 140/80  11/13/12 130/82  04/23/12 124/80   Good control on present medication. Bmet is normal  Plan Continue present regimen

## 2013-09-09 NOTE — Assessment & Plan Note (Signed)
Excellent job of weight loss by portion size reduction.  Plan Continue present life-style management.

## 2013-09-09 NOTE — Assessment & Plan Note (Signed)
PSA in normal range be acceleration is greater than 0.7/12 months.  Plan Patient informed by MyChart: options are to repeat in 6 months or immediate referral to urology. His decision is pending.

## 2013-10-08 ENCOUNTER — Ambulatory Visit (INDEPENDENT_AMBULATORY_CARE_PROVIDER_SITE_OTHER): Payer: Managed Care, Other (non HMO)

## 2013-10-08 DIAGNOSIS — Z23 Encounter for immunization: Secondary | ICD-10-CM

## 2013-12-10 ENCOUNTER — Ambulatory Visit: Payer: Managed Care, Other (non HMO)

## 2014-03-13 ENCOUNTER — Emergency Department (HOSPITAL_BASED_OUTPATIENT_CLINIC_OR_DEPARTMENT_OTHER)
Admission: EM | Admit: 2014-03-13 | Discharge: 2014-03-13 | Disposition: A | Discharge status: Home / Self Care | Payer: Managed Care, Other (non HMO) | Attending: Emergency Medicine | Admitting: Emergency Medicine

## 2014-03-13 ENCOUNTER — Encounter (HOSPITAL_BASED_OUTPATIENT_CLINIC_OR_DEPARTMENT_OTHER): Payer: Self-pay | Admitting: Emergency Medicine

## 2014-03-13 ENCOUNTER — Emergency Department (HOSPITAL_BASED_OUTPATIENT_CLINIC_OR_DEPARTMENT_OTHER): Payer: Managed Care, Other (non HMO)

## 2014-03-13 DIAGNOSIS — Z79899 Other long term (current) drug therapy: Secondary | ICD-10-CM | POA: Insufficient documentation

## 2014-03-13 DIAGNOSIS — Z8669 Personal history of other diseases of the nervous system and sense organs: Secondary | ICD-10-CM | POA: Insufficient documentation

## 2014-03-13 DIAGNOSIS — Z87891 Personal history of nicotine dependence: Secondary | ICD-10-CM | POA: Insufficient documentation

## 2014-03-13 DIAGNOSIS — Z872 Personal history of diseases of the skin and subcutaneous tissue: Secondary | ICD-10-CM | POA: Insufficient documentation

## 2014-03-13 DIAGNOSIS — I10 Essential (primary) hypertension: Secondary | ICD-10-CM | POA: Insufficient documentation

## 2014-03-13 DIAGNOSIS — Z8719 Personal history of other diseases of the digestive system: Secondary | ICD-10-CM | POA: Insufficient documentation

## 2014-03-13 DIAGNOSIS — M109 Gout, unspecified: Secondary | ICD-10-CM | POA: Insufficient documentation

## 2014-03-13 DIAGNOSIS — IMO0002 Reserved for concepts with insufficient information to code with codable children: Secondary | ICD-10-CM | POA: Insufficient documentation

## 2014-03-13 MED ORDER — LISINOPRIL 20 MG PO TABS: 20.0000 mg | ORAL_TABLET | Freq: Every day | ORAL | Status: DC

## 2014-03-13 MED ORDER — HYDROCODONE-ACETAMINOPHEN 5-325 MG PO TABS: 2.0000 | ORAL_TABLET | ORAL | Status: DC | PRN

## 2014-03-13 MED ORDER — PREDNISONE 10 MG PO TABS: ORAL_TABLET | ORAL | Status: DC

## 2014-03-13 NOTE — ED Notes (Signed)
Patient transported to X-ray 

## 2014-03-13 NOTE — Discharge Instructions (Signed)

## 2014-03-13 NOTE — ED Notes (Signed)
Right ankle and foot pain and swelling since yesterday.

## 2014-03-13 NOTE — ED Provider Notes (Signed)
CSN: [id number]     Arrival date & time 03/13/14  2033 History   First MD Initiated Contact with Patient 03/13/14 2042     Chief Complaint  Patient presents with  . Ankle Pain     (Consider location/radiation/quality/duration/timing/severity/associated sxs/prior Treatment) Patient is a 63 y.o. male presenting with ankle pain. The history is provided by the patient. No language interpreter was used.  Ankle Pain Location:  Foot and ankle Time since incident:  2 days Injury: no   Ankle location:  R ankle Foot location:  R foot Pain details:    Quality:  Aching   Radiates to:  Does not radiate   Severity:  Moderate   Onset quality:  Gradual   Duration:  2 days   Timing:  Constant Chronicity:  New Dislocation: no   Foreign body present:  No foreign bodies Relieved by:  Nothing Worsened by:  Nothing tried   Past Medical History  Diagnosis Date  . Hypertension   . Colitis, ulcerative   . Psoriasis   . Macular degeneration, bilateral 07/2012   Past Surgical History  Procedure Laterality Date  . Tonsillectomy    . Acne cyst removal    . Shoulder arthroscopy  2007    Right  . Foot surgery  1988   Family History  Problem Relation Age of Onset  . Cancer Mother     Breast  . Heart disease Mother   . Heart disease Father     CAD/MI  . Diabetes Daughter   . Diabetes Maternal Uncle   . Diabetes Paternal Aunt    History  Substance Use Topics  . Smoking status: Former Smoker    Quit date: 10/23/1989  . Smokeless tobacco: Never Used  . Alcohol Use: Yes     Comment: rare glass of wine - less than once a year    Review of Systems  Musculoskeletal: Positive for joint swelling.  Skin: Negative for wound.  All other systems reviewed and are negative.     Allergies  Sulfonamide derivatives  Home Medications   Prior to Admission medications   Medication Sig Start Date End Date Taking? Authorizing Provider  Ascorbic Acid (VITAMIN C PO) Take 1 tablet by mouth  daily.    Historical Provider, MD  clobetasol ointment (TEMOVATE) 4.27 % APPLY 1 APPLICATION 2 TIMES DAILY 09/05/13   Neena Rhymes, MD  Enalapril-Hydrochlorothiazide 5-12.5 MG per tablet TAKE 1 TABLET EVERY DAY 09/05/13   Neena Rhymes, MD  Multiple Vitamins-Minerals (PRESERVISION AREDS 2) CAPS Take 1 capsule by mouth 2 (two) times daily before lunch and supper. 07/16/12   Historical Provider, MD   BP 157/102  Pulse 76  Temp(Src) 98.1 F (36.7 C) (Oral)  Resp 20  Ht 5' 10"  (1.778 m)  Wt 250 lb (113.399 kg)  BMI 35.87 kg/m2  SpO2 98% Physical Exam  Nursing note and vitals reviewed. Constitutional: He is oriented to person, place, and time. He appears well-developed and well-nourished.  HENT:  Head: Normocephalic.  Eyes: EOM are normal.  Neck: Normal range of motion.  Pulmonary/Chest: Effort normal.  Abdominal: He exhibits no distension.  Musculoskeletal: He exhibits edema and tenderness.  Tender right ankle and toes right foot  Neurological: He is alert and oriented to person, place, and time.  Psychiatric: He has a normal mood and affect.    ED Course  Procedures (including critical care time) Labs Review Labs Reviewed - No data to display  Imaging Review No results found.  EKG Interpretation None      MDM   Final diagnoses:  Gout    lisinopril Prednisone taper Hydrocodone   Fransico Meadow, PA-C 03/13/14 2151

## 2014-03-13 NOTE — ED Provider Notes (Signed)
Medical screening examination/treatment/procedure(s) were performed by non-physician practitioner and as supervising physician I was immediately available for consultation/collaboration.     Veryl Speak, MD 03/13/14 [phone]

## 2014-03-24 ENCOUNTER — Other Ambulatory Visit: Payer: Self-pay | Admitting: *Deleted

## 2014-03-24 ENCOUNTER — Encounter: Payer: Self-pay | Admitting: Internal Medicine

## 2014-03-24 ENCOUNTER — Ambulatory Visit (INDEPENDENT_AMBULATORY_CARE_PROVIDER_SITE_OTHER): Payer: Managed Care, Other (non HMO) | Admitting: Internal Medicine

## 2014-03-24 VITALS — BP 142/80 | HR 72 | Temp 97.9°F | Resp 20 | Ht 70.0 in | Wt 258.0 lb

## 2014-03-24 DIAGNOSIS — M109 Gout, unspecified: Secondary | ICD-10-CM

## 2014-03-24 DIAGNOSIS — I10 Essential (primary) hypertension: Secondary | ICD-10-CM

## 2014-03-24 LAB — URIC ACID: Uric Acid, Serum: 7.3 mg/dL (ref 4.0–7.8)

## 2014-03-24 MED ORDER — PREDNISONE 20 MG PO TABS: 20.0000 mg | ORAL_TABLET | Freq: Two times a day (BID) | ORAL | Status: DC

## 2014-03-24 MED ORDER — LISINOPRIL 20 MG PO TABS: 20.0000 mg | ORAL_TABLET | Freq: Every day | ORAL | Status: DC

## 2014-03-24 NOTE — Patient Instructions (Signed)
Purine Restricted Diet A low-purine diet consists of foods that reduce uric acid made in your body. INDICATIONS FOR USE  Your caregiver may ask you to follow a low-purine diet to reduce gout flairs.  GUIDELINES  Avoid high-purine foods, including all alcohol, yeast extracts taken as supplements, and sauces made from meats (like gravy). Do not eat high-purine meats, including anchovies, sardines, herring, mussels, tuna, codfish, scallops, trout, haddock, bacon, organ meats, tripe, goose, wild game, and sweetbreads.  Grains  Allowed/Recommended: All, except those listed to consume in moderation.  Consume in Moderation: Oatmeal ( cup uncooked daily), wheat bran or germ ( cup daily), and whole grains. Vegetables  Allowed/Recommended: All, except those listed to consume in moderation.  Consume in Moderation: Asparagus, cauliflower, spinach, mushrooms, and green peas ( cup daily). Fruit  Allowed/Recommended: All.  Consume in Moderation: None. Meat and Meat Substitutes  Allowed/Recommended: Eggs, nuts, and peanut butter.  Consume in Moderation: Limit to 4 to 6 oz daily. Avoid high-purine meats. Lentils, peas, and dried beans (1 cup daily). Milk  Allowed/Recommended: All. Choose low-fat or skim when possible.  Consume in Moderation: None. Fats and Oils  Allowed/Recommended: All.  Consume in Moderation: None. Beverages  Allowed/Recommended: All, except those listed to avoid.  Avoid: All alcohol. Condiments/Miscellaneous  Allowed/Recommended: All, except those listed to consume in moderation.  Consume in Moderation: Bouillon and meat-based broths and soups. Document Released: 02/25/2011 Document Revised: 01/23/2012 Document Reviewed: 02/25/2011 [facility] Patient Information 2014 [place]. Gout Gout is an inflammatory arthritis caused by a buildup of uric acid crystals in the joints. Uric acid is a chemical that is normally present in the blood. When the level of uric  acid in the blood is too high it can form crystals that deposit in your joints and tissues. This causes joint redness, soreness, and swelling (inflammation). Repeat attacks are common. Over time, uric acid crystals can form into masses (tophi) near a joint, destroying bone and causing disfigurement. Gout is treatable and often preventable. CAUSES  The disease begins with elevated levels of uric acid in the blood. Uric acid is produced by your body when it breaks down a naturally found substance called purines. Certain foods you eat, such as meats and fish, contain high amounts of purines. Causes of an elevated uric acid level include:  Being passed down from parent to child (heredity).  Diseases that cause increased uric acid production (such as obesity, psoriasis, and certain cancers).  Excessive alcohol use.  Diet, especially diets rich in meat and seafood.  Medicines, including certain cancer-fighting medicines (chemotherapy), water pills (diuretics), and aspirin.  Chronic kidney disease. The kidneys are no longer able to remove uric acid well.  Problems with metabolism. Conditions strongly associated with gout include:  Obesity.  High blood pressure.  High cholesterol.  Diabetes. Not everyone with elevated uric acid levels gets gout. It is not understood why some people get gout and others do not. Surgery, joint injury, and eating too much of certain foods are some of the factors that can lead to gout attacks. SYMPTOMS   An attack of gout comes on quickly. It causes intense pain with redness, swelling, and warmth in a joint.  Fever can occur.  Often, only one joint is involved. Certain joints are more commonly involved:  Base of the big toe.  Knee.  Ankle.  Wrist.  Finger. Without treatment, an attack usually goes away in a few days to weeks. Between attacks, you usually will not have symptoms, which is different  from many other forms of arthritis. DIAGNOSIS  Your  caregiver will suspect gout based on your symptoms and exam. In some cases, tests may be recommended. The tests may include:  Blood tests.  Urine tests.  X-rays.  Joint fluid exam. This exam requires a needle to remove fluid from the joint (arthrocentesis). Using a microscope, gout is confirmed when uric acid crystals are seen in the joint fluid. TREATMENT  There are two phases to gout treatment: treating the sudden onset (acute) attack and preventing attacks (prophylaxis).  Treatment of an Acute Attack.  Medicines are used. These include anti-inflammatory medicines or steroid medicines.  An injection of steroid medicine into the affected joint is sometimes necessary.  The painful joint is rested. Movement can worsen the arthritis.  You may use warm or cold treatments on painful joints, depending which works best for you.  Treatment to Prevent Attacks.  If you suffer from frequent gout attacks, your caregiver may advise preventive medicine. These medicines are started after the acute attack subsides. These medicines either help your kidneys eliminate uric acid from your body or decrease your uric acid production. You may need to stay on these medicines for a very long time.  The early phase of treatment with preventive medicine can be associated with an increase in acute gout attacks. For this reason, during the first few months of treatment, your caregiver may also advise you to take medicines usually used for acute gout treatment. Be sure you understand your caregiver's directions. Your caregiver may make several adjustments to your medicine dose before these medicines are effective.  Discuss dietary treatment with your caregiver or dietitian. Alcohol and drinks high in sugar and fructose and foods such as meat, poultry, and seafood can increase uric acid levels. Your caregiver or dietician can advise you on drinks and foods that should be limited. HOME CARE INSTRUCTIONS   Do not  take aspirin to relieve pain. This raises uric acid levels.  Only take over-the-counter or prescription medicines for pain, discomfort, or fever as directed by your caregiver.  Rest the joint as much as possible. When in bed, keep sheets and blankets off painful areas.  Keep the affected joint raised (elevated).  Apply warm or cold treatments to painful joints. Use of warm or cold treatments depends on which works best for you.  Use crutches if the painful joint is in your leg.  Drink enough fluids to keep your urine clear or pale yellow. This helps your body get rid of uric acid. Limit alcohol, sugary drinks, and fructose drinks.  Follow your dietary instructions. Pay careful attention to the amount of protein you eat. Your daily diet should emphasize fruits, vegetables, whole grains, and fat-free or low-fat milk products. Discuss the use of coffee, vitamin C, and cherries with your caregiver or dietician. These may be helpful in lowering uric acid levels.  Maintain a healthy body weight. SEEK MEDICAL CARE IF:   You develop diarrhea, vomiting, or any side effects from medicines.  You do not feel better in 24 hours, or you are getting worse. SEEK IMMEDIATE MEDICAL CARE IF:   Your joint becomes suddenly more tender, and you have chills or a fever. MAKE SURE YOU:   Understand these instructions.  Will watch your condition.  Will get help right away if you are not doing well or get worse. Document Released: 10/28/2000 Document Revised: 02/25/2013 Document Reviewed: 06/13/2012 [facility] Patient Information 2014 [place].

## 2014-03-24 NOTE — Progress Notes (Signed)
Pre-visit discussion using our clinic review tool. No additional management support is needed unless otherwise documented below in the visit note.  

## 2014-03-24 NOTE — Progress Notes (Signed)
Subjective:    Patient ID: Edward Goodwin, male    DOB: [birth year], 63 y.o.   MRN: [id number]  HPI  63 year old patient who was seen in the ED recently and treated for suspected gout.  He was treated with prednisone taper and did improve.  Has been off oral prednisone.  He has more recently developed pain and saw them on his right first MTP joint. Antihypertensive therapy includes diuretic therapy  Past Medical History  Diagnosis Date  . Hypertension   . Colitis, ulcerative   . Psoriasis   . Macular degeneration, bilateral 07/2012    History   Social History  . Marital Status: Married    Spouse Name: N/A    Number of Children: 1  . Years of Education: 12   Occupational History  . insurance sales    Social History Main Topics  . Smoking status: Former Smoker    Quit date: 10/23/1989  . Smokeless tobacco: Never Used  . Alcohol Use: Yes     Comment: rare glass of wine - less than once a year  . Drug Use: No  . Sexual Activity: Not Currently   Other Topics Concern  . 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 [employer] 6 years. Married 1971   I daughter 75; 1 grandchild 2008 - daughter divorced 2010. 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 Yes for acute CPR, no for prolonged mechanical ventilatory support, no for prolonged artificial nutrition or other heroic measures leaving him in an incapacitated state.     Past Surgical History  Procedure Laterality Date  . Tonsillectomy    . Acne cyst removal    . Shoulder arthroscopy  2007    Right  . Foot surgery  1988    Family History  Problem Relation Age of Onset  . Cancer Mother     Breast  . Heart disease Mother   . Heart disease Father     CAD/MI  . Diabetes Daughter   . Diabetes Maternal Uncle   . Diabetes Paternal Aunt     Allergies  Allergen Reactions  . Sulfonamide Derivatives     Current Outpatient Prescriptions on File Prior to Visit    Medication Sig Dispense Refill  . Ascorbic Acid (VITAMIN C PO) Take 1 tablet by mouth daily.      . clobetasol ointment (TEMOVATE) 2.23 % APPLY 1 APPLICATION 2 TIMES DAILY  30 g  5  . HYDROcodone-acetaminophen (NORCO/VICODIN) 5-325 MG per tablet Take 2 tablets by mouth every 4 (four) hours as needed.  20 tablet  0  . Multiple Vitamins-Minerals (PRESERVISION AREDS 2) CAPS Take 1 capsule by mouth 2 (two) times daily before lunch and supper.       No current facility-administered medications on file prior to visit.    BP 142/80  Pulse 72  Temp(Src) 97.9 F (36.6 C) (Oral)  Resp 20  Ht 5' 10"  (1.778 m)  Wt 258 lb (117.028 kg)  BMI 37.02 kg/m2  SpO2 98%       Review of Systems  Constitutional: Negative for fever, chills, appetite change and fatigue.  HENT: Negative for congestion, dental problem, ear pain, hearing loss, sore throat, tinnitus, trouble swallowing and voice change.   Eyes: Negative for pain, discharge and visual disturbance.  Respiratory: Negative for cough, chest tightness, wheezing and stridor.   Cardiovascular: Negative for chest pain, palpitations and leg swelling.  Gastrointestinal: Negative  for nausea, vomiting, abdominal pain, diarrhea, constipation, blood in stool and abdominal distention.  Genitourinary: Negative for urgency, hematuria, flank pain, discharge, difficulty urinating and genital sores.  Musculoskeletal: Positive for arthralgias and joint swelling. Negative for back pain, gait problem, myalgias and neck stiffness.  Skin: Negative for rash.  Neurological: Negative for dizziness, syncope, speech difficulty, weakness, numbness and headaches.  Hematological: Negative for adenopathy. Does not bruise/bleed easily.  Psychiatric/Behavioral: Negative for behavioral problems and dysphoric mood. The patient is not nervous/anxious.        Objective:   Physical Exam  Constitutional: He appears well-developed and well-nourished. No distress.  Repeat blood  pressure 120/80  Musculoskeletal:  Pain erythema and tenderness about the right first MTP joint Mild soft tissue swelling involving the right ankle          Assessment & Plan:   Recurrent gouty arthritis.  We'll check a uric acid level.  We'll discontinue hydrochlorothiazide.  We'll treat with 7 days of oral prednisone 20 mg twice a day.  We'll continue ACE inhibition for blood pressure control Hypertension stable.  Discontinue hydrochlorothiazide and continue lisinopril

## 2014-03-25 ENCOUNTER — Telehealth: Payer: Self-pay | Admitting: Internal Medicine

## 2014-03-25 NOTE — Telephone Encounter (Signed)
Relevant patient education assigned to patient using Emmi. ° °

## 2014-05-06 ENCOUNTER — Ambulatory Visit: Payer: Managed Care, Other (non HMO) | Admitting: Family Medicine

## 2014-05-06 ENCOUNTER — Ambulatory Visit: Payer: Managed Care, Other (non HMO) | Admitting: Internal Medicine

## 2014-07-07 ENCOUNTER — Encounter: Payer: Self-pay | Admitting: Internal Medicine

## 2014-07-07 ENCOUNTER — Ambulatory Visit (INDEPENDENT_AMBULATORY_CARE_PROVIDER_SITE_OTHER): Payer: Managed Care, Other (non HMO) | Admitting: Internal Medicine

## 2014-07-07 VITALS — BP 140/82 | HR 74 | Temp 98.5°F | Resp 20 | Ht 70.0 in | Wt 268.0 lb

## 2014-07-07 DIAGNOSIS — I10 Essential (primary) hypertension: Secondary | ICD-10-CM

## 2014-07-07 DIAGNOSIS — J069 Acute upper respiratory infection, unspecified: Secondary | ICD-10-CM

## 2014-07-07 DIAGNOSIS — B9789 Other viral agents as the cause of diseases classified elsewhere: Secondary | ICD-10-CM

## 2014-07-07 MED ORDER — HYDROCODONE-HOMATROPINE 5-1.5 MG/5ML PO SYRP: 5.0000 mL | ORAL_SOLUTION | Freq: Four times a day (QID) | ORAL | Status: DC | PRN

## 2014-07-07 NOTE — Progress Notes (Signed)
Subjective:    Patient ID: Edward Goodwin, male    DOB: 1951-06-17, 63 y.o.   MRN: [id number]  HPI 63 year old patient who has a history of treated hypertension.  He presents today with a one-week history of mild sore throat and cough.  Cough is so worse through the night and interferes with sleep, but is manageable during the day.  There's been no fever, shortness of breath, wheezing, or purulent sputum production.  His wife has had a similar illness but has improved  Past Medical History  Diagnosis Date  . Hypertension   . Colitis, ulcerative   . Psoriasis   . Macular degeneration, bilateral 07/2012    History   Social History  . Marital Status: Married    Spouse Name: N/A    Number of Children: 1  . Years of Education: 12   Occupational History  . insurance sales    Social History Main Topics  . Smoking status: Former Smoker    Quit date: 10/23/1989  . Smokeless tobacco: Never Used  . Alcohol Use: Yes     Comment: rare glass of wine - less than once a year  . Drug Use: No  . Sexual Activity: Not Currently   Other Topics Concern  . 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 [employer] 6 years. Married 1971   I daughter 54; 1 grandchild 2008 - daughter divorced 2010. 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 Yes for acute CPR, no for prolonged mechanical ventilatory support, no for prolonged artificial nutrition or other heroic measures leaving him in an incapacitated state.     Past Surgical History  Procedure Laterality Date  . Tonsillectomy    . Acne cyst removal    . Shoulder arthroscopy  2007    Right  . Foot surgery  1988    Family History  Problem Relation Age of Onset  . Cancer Mother     Breast  . Heart disease Mother   . Heart disease Father     CAD/MI  . Diabetes Daughter   . Diabetes Maternal Uncle   . Diabetes Paternal Aunt     Allergies  Allergen Reactions  . Sulfonamide  Derivatives     Current Outpatient Prescriptions on File Prior to Visit  Medication Sig Dispense Refill  . Ascorbic Acid (VITAMIN C PO) Take 1 tablet by mouth daily.      . clobetasol ointment (TEMOVATE) 4.70 % APPLY 1 APPLICATION 2 TIMES DAILY  30 g  5  . lisinopril (PRINIVIL,ZESTRIL) 20 MG tablet Take 1 tablet (20 mg total) by mouth daily.  90 tablet  1  . Multiple Vitamins-Minerals (PRESERVISION AREDS 2) CAPS Take 1 capsule by mouth 2 (two) times daily before lunch and supper.      Marland Kitchen HYDROcodone-acetaminophen (NORCO/VICODIN) 5-325 MG per tablet Take 2 tablets by mouth every 4 (four) hours as needed.  20 tablet  0   No current facility-administered medications on file prior to visit.    BP 140/82  Pulse 74  Temp(Src) 98.5 F (36.9 C) (Oral)  Resp 20  Ht 5' 10"  (1.778 m)  Wt 268 lb (121.564 kg)  BMI 38.45 kg/m2  SpO2 97%     Review of Systems  Constitutional: Positive for activity change, appetite change and fatigue. Negative for fever and chills.  HENT: Positive for congestion, sinus pressure and sore throat. Negative for dental problem, ear pain, hearing  loss, tinnitus, trouble swallowing and voice change.   Eyes: Negative for pain, discharge and visual disturbance.  Respiratory: Positive for cough. Negative for chest tightness, wheezing and stridor.   Cardiovascular: Negative for chest pain, palpitations and leg swelling.  Gastrointestinal: Negative for nausea, vomiting, abdominal pain, diarrhea, constipation, blood in stool and abdominal distention.  Genitourinary: Negative for urgency, hematuria, flank pain, discharge, difficulty urinating and genital sores.  Musculoskeletal: Negative for arthralgias, back pain, gait problem, joint swelling, myalgias and neck stiffness.  Skin: Negative for rash.  Neurological: Negative for dizziness, syncope, speech difficulty, weakness, numbness and headaches.  Hematological: Negative for adenopathy. Does not bruise/bleed easily.    Psychiatric/Behavioral: Negative for behavioral problems and dysphoric mood. The patient is not nervous/anxious.        Objective:   Physical Exam  Constitutional: He is oriented to person, place, and time. He appears well-developed.  Blood pressure 134/72  HENT:  Head: Normocephalic.  Right Ear: External ear normal.  Left Ear: External ear normal.  Mild erythema of the oropharynx  Eyes: Conjunctivae and EOM are normal.  Neck: Normal range of motion.  Cardiovascular: Normal rate and normal heart sounds.   Pulmonary/Chest: Effort normal and breath sounds normal. No respiratory distress. He has no wheezes. He has no rales.  Abdominal: Bowel sounds are normal.  Musculoskeletal: Normal range of motion. He exhibits no edema and no tenderness.  Neurological: He is alert and oriented to person, place, and time.  Psychiatric: He has a normal mood and affect. His behavior is normal.          Assessment & Plan:   Viral URI with cough.  Will treat symptomatically Hypertension well controlled off diuretic therapy History of gout

## 2014-07-07 NOTE — Patient Instructions (Addendum)
Limit your sodium (Salt) intake  Please check your blood pressure on a regular basis.  If it is consistently greater than 150/90, please make an office appointment.    Acute bronchitis symptoms for less than 10 days are generally not helped by antibiotics.  Take over-the-counter expectorants and cough medications such as  Mucinex DM.  Call if there is no improvement in 5 to 7 days or if  you develop worsening cough, fever, or new symptoms, such as shortness of breath or chest pain.

## 2014-07-07 NOTE — Progress Notes (Signed)
Pre visit review using our clinic review tool, if applicable. No additional management support is needed unless otherwise documented below in the visit note. 

## 2014-07-28 ENCOUNTER — Encounter: Payer: Self-pay | Admitting: Internal Medicine

## 2014-08-29 ENCOUNTER — Other Ambulatory Visit: Payer: Self-pay

## 2014-09-01 ENCOUNTER — Other Ambulatory Visit (INDEPENDENT_AMBULATORY_CARE_PROVIDER_SITE_OTHER): Payer: Managed Care, Other (non HMO)

## 2014-09-01 DIAGNOSIS — Z0000 Encounter for general adult medical examination without abnormal findings: Secondary | ICD-10-CM

## 2014-09-01 LAB — PSA: PSA: 2.56 ng/mL (ref 0.10–4.00)

## 2014-09-01 LAB — POCT URINALYSIS DIPSTICK
BILIRUBIN UA: NEGATIVE
Blood, UA: NEGATIVE
GLUCOSE UA: NEGATIVE
Ketones, UA: NEGATIVE
Leukocytes, UA: NEGATIVE
Nitrite, UA: NEGATIVE
Protein, UA: NEGATIVE
SPEC GRAV UA: 1.015
Urobilinogen, UA: 2
pH, UA: 5.5

## 2014-09-01 LAB — CBC WITH DIFFERENTIAL/PLATELET
Basophils Absolute: 0 10*3/uL (ref 0.0–0.1)
Basophils Relative: 0.3 % (ref 0.0–3.0)
EOS PCT: 1.6 % (ref 0.0–5.0)
Eosinophils Absolute: 0.1 10*3/uL (ref 0.0–0.7)
HEMATOCRIT: 46.3 % (ref 39.0–52.0)
Hemoglobin: 15.5 g/dL (ref 13.0–17.0)
LYMPHS ABS: 1.1 10*3/uL (ref 0.7–4.0)
LYMPHS PCT: 14.2 % (ref 12.0–46.0)
MCHC: 33.4 g/dL (ref 30.0–36.0)
MCV: 87.2 fl (ref 78.0–100.0)
MONOS PCT: 9.4 % (ref 3.0–12.0)
Monocytes Absolute: 0.7 10*3/uL (ref 0.1–1.0)
NEUTROS PCT: 74.5 % (ref 43.0–77.0)
Neutro Abs: 5.7 10*3/uL (ref 1.4–7.7)
PLATELETS: 162 10*3/uL (ref 150.0–400.0)
RBC: 5.31 Mil/uL (ref 4.22–5.81)
RDW: 14 % (ref 11.5–15.5)
WBC: 7.6 10*3/uL (ref 4.0–10.5)

## 2014-09-01 LAB — BASIC METABOLIC PANEL
BUN: 15 mg/dL (ref 6–23)
CO2: 28 mEq/L (ref 19–32)
Calcium: 9.2 mg/dL (ref 8.4–10.5)
Chloride: 105 mEq/L (ref 96–112)
Creatinine, Ser: 0.8 mg/dL (ref 0.4–1.5)
GFR: 106.07 mL/min (ref >60.00)
Glucose, Bld: 102 mg/dL — ABNORMAL HIGH (ref 70–99)
POTASSIUM: 4.9 meq/L (ref 3.5–5.1)
Sodium: 144 mEq/L (ref 135–145)

## 2014-09-01 LAB — HEPATIC FUNCTION PANEL
ALT: 41 U/L (ref 0–53)
AST: 36 U/L (ref 0–37)
Albumin: 3.7 g/dL (ref 3.5–5.2)
Alkaline Phosphatase: 63 U/L (ref 39–117)
BILIRUBIN DIRECT: 0.2 mg/dL (ref 0.0–0.3)
BILIRUBIN TOTAL: 0.8 mg/dL (ref 0.2–1.2)
Total Protein: 6.8 g/dL (ref 6.0–8.3)

## 2014-09-01 LAB — LIPID PANEL
CHOL/HDL RATIO: 5
CHOLESTEROL: 161 mg/dL (ref 0–200)
HDL: 29.7 mg/dL — AB (ref >39.00)
LDL CALC: 103 mg/dL — AB (ref 0–99)
NonHDL: 131.3
TRIGLYCERIDES: 144 mg/dL (ref 0.0–149.0)
VLDL: 28.8 mg/dL (ref 0.0–40.0)

## 2014-09-01 LAB — TSH: TSH: 1.44 u[IU]/mL (ref 0.35–4.50)

## 2014-09-08 ENCOUNTER — Encounter: Payer: Self-pay | Admitting: Internal Medicine

## 2014-09-08 ENCOUNTER — Ambulatory Visit (INDEPENDENT_AMBULATORY_CARE_PROVIDER_SITE_OTHER): Payer: Managed Care, Other (non HMO) | Admitting: Internal Medicine

## 2014-09-08 VITALS — BP 136/88 | HR 68 | Temp 97.9°F | Resp 20 | Ht 69.25 in | Wt 268.0 lb

## 2014-09-08 DIAGNOSIS — Z23 Encounter for immunization: Secondary | ICD-10-CM

## 2014-09-08 DIAGNOSIS — Z0000 Encounter for general adult medical examination without abnormal findings: Secondary | ICD-10-CM

## 2014-09-08 DIAGNOSIS — I10 Essential (primary) hypertension: Secondary | ICD-10-CM

## 2014-09-08 MED ORDER — LISINOPRIL 20 MG PO TABS: 20.0000 mg | ORAL_TABLET | Freq: Every day | ORAL | Status: DC

## 2014-09-08 NOTE — Progress Notes (Signed)
Pre visit review using our clinic review tool, if applicable. No additional management support is needed unless otherwise documented below in the visit note. 

## 2014-09-08 NOTE — Patient Instructions (Signed)

## 2014-09-08 NOTE — Progress Notes (Signed)
Subjective:    Patient ID: Edward Goodwin, male    DOB: [birth year], 63 y.o.   MRN: [id number]  HPI  63 year old patient who is seen today for a preventive health examination. He has received a recall letter for follow-up colonoscopy.  He has a history of metabolic syndrome with obesity, hypertension, dyslipidemia with a low HDL cholesterol, as well as impaired glucose tolerance He has a history of gout which has been stable.  Wt Readings from Last 3 Encounters:  09/08/14 268 lb (121.564 kg)  07/07/14 268 lb (121.564 kg)  03/24/14 258 lb (117.028 kg)    Past Medical History  Diagnosis Date  . Hypertension   . Colitis, ulcerative   . Psoriasis   . Macular degeneration, bilateral 07/2012    History   Social History  . Marital Status: Married    Spouse Name: N/A    Number of Children: 1  . Years of Education: 12   Occupational History  . insurance sales    Social History Main Topics  . Smoking status: Former Smoker    Quit date: 10/23/1989  . Smokeless tobacco: Never Used  . Alcohol Use: Yes     Comment: rare glass of wine - less than once a year  . Drug Use: No  . Sexual Activity: Not Currently   Other Topics Concern  . 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 [employer] 6 years. Married 1971   I daughter 38; 1 grandchild 2008 - daughter divorced 2010. 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 Yes for acute CPR, no for prolonged mechanical ventilatory support, no for prolonged artificial nutrition or other heroic measures leaving him in an incapacitated state.     Past Surgical History  Procedure Laterality Date  . Tonsillectomy    . Acne cyst removal    . Shoulder arthroscopy  2007    Right  . Foot surgery  1988    Family History  Problem Relation Age of Onset  . Cancer Mother     Breast  . Heart disease Mother   . Heart disease Father     CAD/MI  . Diabetes Daughter   . Diabetes Maternal  Uncle   . Diabetes Paternal Aunt     Allergies  Allergen Reactions  . Sulfonamide Derivatives     Current Outpatient Prescriptions on File Prior to Visit  Medication Sig Dispense Refill  . Ascorbic Acid (VITAMIN C PO) Take 1 tablet by mouth daily.      . clobetasol ointment (TEMOVATE) 3.35 % APPLY 1 APPLICATION 2 TIMES DAILY  30 g  5  . Multiple Vitamins-Minerals (PRESERVISION AREDS 2) CAPS Take 1 capsule by mouth 2 (two) times daily before lunch and supper.       No current facility-administered medications on file prior to visit.    BP 136/88  Pulse 68  Temp(Src) 97.9 F (36.6 C) (Oral)  Resp 20  Ht 5' 9.25" (1.759 m)  Wt 268 lb (121.564 kg)  BMI 39.29 kg/m2  SpO2 97%     Review of Systems  Constitutional: Negative for fever, chills, activity change, appetite change and fatigue.  HENT: Negative for congestion, dental problem, ear pain, hearing loss, mouth sores, rhinorrhea, sinus pressure, sneezing, tinnitus, trouble swallowing and voice change.   Eyes: Negative for photophobia, pain, redness and visual disturbance.  Respiratory: Negative for apnea, cough, choking, chest tightness, shortness of breath and wheezing.  Cardiovascular: Negative for chest pain, palpitations and leg swelling.  Gastrointestinal: Negative for nausea, vomiting, abdominal pain, diarrhea, constipation, blood in stool, abdominal distention, anal bleeding and rectal pain.  Genitourinary: Negative for dysuria, urgency, frequency, hematuria, flank pain, decreased urine volume, discharge, penile swelling, scrotal swelling, difficulty urinating, genital sores and testicular pain.  Musculoskeletal: Negative for arthralgias, back pain, gait problem, joint swelling, myalgias, neck pain and neck stiffness.  Skin: Negative for color change, rash and wound.  Neurological: Negative for dizziness, tremors, seizures, syncope, facial asymmetry, speech difficulty, weakness, light-headedness, numbness and headaches.    Hematological: Negative for adenopathy. Does not bruise/bleed easily.  Psychiatric/Behavioral: Negative for suicidal ideas, hallucinations, behavioral problems, confusion, sleep disturbance, self-injury, dysphoric mood, decreased concentration and agitation. The patient is not nervous/anxious.        Objective:   Physical Exam  Constitutional: He appears well-developed and well-nourished.  Repeat blood pressure 130/82  HENT:  Head: Normocephalic and atraumatic.  Right Ear: External ear normal.  Left Ear: External ear normal.  Nose: Nose normal.  Mouth/Throat: Oropharynx is clear and moist.  Pharyngeal crowding  Eyes: Conjunctivae and EOM are normal. Pupils are equal, round, and reactive to light. No scleral icterus.  Neck: Normal range of motion. Neck supple. No JVD present. No thyromegaly present.  Cardiovascular: Regular rhythm, normal heart sounds and intact distal pulses.  Exam reveals no gallop and no friction rub.   No murmur heard. Pulmonary/Chest: Effort normal and breath sounds normal. He exhibits no tenderness.  Abdominal: Soft. Bowel sounds are normal. He exhibits no distension and no mass. There is no tenderness.  Genitourinary: Prostate normal and penis normal.  Symmetrically enlarged  Musculoskeletal: Normal range of motion. He exhibits no edema and no tenderness.  Lymphadenopathy:    He has no cervical adenopathy.  Neurological: He is alert. He has normal reflexes. No cranial nerve deficit. Coordination normal.  Skin: Skin is warm and dry. No rash noted.  Psychiatric: He has a normal mood and affect. His behavior is normal.          Assessment & Plan:   Preventive health examination Follow-up colonoscopy urged he plans on doing this after the first of the year Hypertension, stable Metabolic syndrome.  Weight loss, exercise.  Encouraged History of gout, stable   Recheck one year

## 2014-09-30 ENCOUNTER — Ambulatory Visit (INDEPENDENT_AMBULATORY_CARE_PROVIDER_SITE_OTHER): Payer: Managed Care, Other (non HMO) | Admitting: Family Medicine

## 2014-09-30 ENCOUNTER — Encounter: Payer: Self-pay | Admitting: Family Medicine

## 2014-09-30 VITALS — BP 165/92 | HR 70 | Temp 99.2°F | Ht 69.25 in | Wt 263.0 lb

## 2014-09-30 DIAGNOSIS — J0100 Acute maxillary sinusitis, unspecified: Secondary | ICD-10-CM

## 2014-09-30 MED ORDER — AMOXICILLIN-POT CLAVULANATE 875-125 MG PO TABS: 1.0000 | ORAL_TABLET | Freq: Two times a day (BID) | ORAL | Status: DC

## 2014-09-30 NOTE — Progress Notes (Signed)
Pre visit review using our clinic review tool, if applicable. No additional management support is needed unless otherwise documented below in the visit note. 

## 2014-09-30 NOTE — Progress Notes (Signed)
   Subjective:    Patient ID: [name], male    DOB: 1951/08/28, 63 y.o.   MRN: [id number]  HPI Here for one week of sinus pressure, PND, and coughing up yellow sputum.    Review of Systems  Constitutional: Negative.   HENT: Positive for congestion, postnasal drip and sinus pressure.   Eyes: Negative.   Respiratory: Positive for cough.        Objective:   Physical Exam  Constitutional: He appears well-developed and well-nourished.  HENT:  Right Ear: External ear normal.  Left Ear: External ear normal.  Nose: Nose normal.  Mouth/Throat: Oropharynx is clear and moist.  Eyes: Conjunctivae are normal.  Pulmonary/Chest: Effort normal and breath sounds normal.  Lymphadenopathy:    He has no cervical adenopathy.          Assessment & Plan:  Add Mucinex.

## 2014-10-07 ENCOUNTER — Other Ambulatory Visit: Payer: Self-pay | Admitting: Internal Medicine

## 2014-10-14 HISTORY — PX: CATARACT EXTRACTION W/ INTRAOCULAR LENS IMPLANT: SHX1309

## 2014-10-16 ENCOUNTER — Other Ambulatory Visit: Payer: Self-pay

## 2014-10-16 MED ORDER — CLOBETASOL PROPIONATE 0.05 % EX OINT: TOPICAL_OINTMENT | CUTANEOUS | Status: DC

## 2014-12-09 ENCOUNTER — Ambulatory Visit (INDEPENDENT_AMBULATORY_CARE_PROVIDER_SITE_OTHER): Payer: Managed Care, Other (non HMO) | Admitting: Internal Medicine

## 2014-12-09 ENCOUNTER — Encounter: Payer: Self-pay | Admitting: Internal Medicine

## 2014-12-09 VITALS — BP 130/80 | HR 87 | Temp 97.7°F | Resp 20 | Ht 69.25 in | Wt 270.0 lb

## 2014-12-09 DIAGNOSIS — M1000 Idiopathic gout, unspecified site: Secondary | ICD-10-CM

## 2014-12-09 DIAGNOSIS — I10 Essential (primary) hypertension: Secondary | ICD-10-CM

## 2014-12-09 MED ORDER — PREDNISONE 10 MG PO TABS: ORAL_TABLET | ORAL | Status: DC

## 2014-12-09 NOTE — Progress Notes (Signed)
Subjective:    Patient ID: Edward Goodwin, male    DOB: 06-28-1951, 64 y.o.   MRN: [id number]  HPI  64 year old patient who has a history of prior gout.  3 days ago.  The patient had the onset of pain involving his right first MTP joint.  This has improved somewhat but he has also developed pain involving the right ankle and swelling across the dorsal aspect of the right foot.  He had a very similar episode in May of last year that responded well to prednisone.  He has hypertension and diuretic therapy was discontinued in May of last year.  Uric acid level was in a high normal range  Past Medical History  Diagnosis Date  . Hypertension   . Colitis, ulcerative   . Psoriasis   . Macular degeneration, bilateral 07/2012    History   Social History  . Marital Status: Married    Spouse Name: N/A    Number of Children: 1  . Years of Education: 12   Occupational History  . insurance sales    Social History Main Topics  . Smoking status: Former Smoker    Quit date: 10/23/1989  . Smokeless tobacco: Never Used  . Alcohol Use: 0.0 oz/week    0 Not specified per week     Comment: rare glass of wine - less than once a year  . Drug Use: No  . Sexual Activity: Not Currently   Other Topics Concern  . 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 Married 1971   I daughter 71; 1 grandchild 2008 - daughter divorced 2010. 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 Yes for acute CPR, no for prolonged mechanical ventilatory support, no for prolonged artificial nutrition or other heroic measures leaving him in an incapacitated state.     Past Surgical History  Procedure Laterality Date  . Tonsillectomy    . Acne cyst removal    . Shoulder arthroscopy  2007    Right  . Foot surgery  1988    Family History  Problem Relation Age of Onset  . Cancer Mother     Breast  . Heart disease Mother   . Heart disease  Father     CAD/MI  . Diabetes Daughter   . Diabetes Maternal Uncle   . Diabetes Paternal Aunt     Allergies  Allergen Reactions  . Sulfonamide Derivatives     Current Outpatient Prescriptions on File Prior to Visit  Medication Sig Dispense Refill  . Ascorbic Acid (VITAMIN C PO) Take 1 tablet by mouth daily.    . clobetasol ointment (TEMOVATE) 4.96 % APPLY 1 APPLICATION 2 TIMES DAILY 30 g 5  . lisinopril (PRINIVIL,ZESTRIL) 20 MG tablet Take 1 tablet (20 mg total) by mouth daily. 90 tablet 3  . Multiple Vitamins-Minerals (PRESERVISION AREDS 2) CAPS Take 1 capsule by mouth 2 (two) times daily before lunch and supper.     No current facility-administered medications on file prior to visit.    BP 130/80 mmHg  Pulse 87  Temp(Src) 97.7 F (36.5 C) (Oral)  Resp 20  Ht 5' 9.25" (1.759 m)  Wt 270 lb (122.471 kg)  BMI 39.58 kg/m2  SpO2 95%     Review of Systems  Constitutional: Negative for fever, chills, appetite change and fatigue.  HENT: Negative for congestion, dental problem, ear pain, hearing loss, sore throat, tinnitus, trouble  swallowing and voice change.   Eyes: Negative for pain, discharge and visual disturbance.  Respiratory: Negative for cough, chest tightness, wheezing and stridor.   Cardiovascular: Negative for chest pain, palpitations and leg swelling.  Gastrointestinal: Negative for nausea, vomiting, abdominal pain, diarrhea, constipation, blood in stool and abdominal distention.  Genitourinary: Negative for urgency, hematuria, flank pain, discharge, difficulty urinating and genital sores.  Musculoskeletal: Positive for joint swelling and arthralgias. Negative for myalgias, back pain, gait problem and neck stiffness.  Skin: Negative for rash.  Neurological: Negative for dizziness, syncope, speech difficulty, weakness, numbness and headaches.  Hematological: Negative for adenopathy. Does not bruise/bleed easily.  Psychiatric/Behavioral: Negative for behavioral  problems and dysphoric mood. The patient is not nervous/anxious.        Objective:   Physical Exam  Constitutional: He appears well-developed and well-nourished. No distress.  Musculoskeletal:  Slight erythema and soft tissue swelling involving the right first MTP joint, right lateral ankle and proximal aspect of the dorsal aspect of the right foot          Assessment & Plan:  Recurrent gouty arthritis.  Will retreat with prednisone.  Will place on a low purine diet.  We'll consider allopurinol if any recurrent episodes Hypertension, stable

## 2014-12-09 NOTE — Progress Notes (Signed)
Pre visit review using our clinic review tool, if applicable. No additional management support is needed unless otherwise documented below in the visit note. 

## 2014-12-09 NOTE — Patient Instructions (Addendum)
Low-Purine Diet Purines are compounds that affect the level of uric acid in your body. A low-purine diet is a diet that is low in purines. Eating a low-purine diet can prevent the level of uric acid in your body from getting too high and causing gout or kidney stones or both. WHAT DO I NEED TO KNOW ABOUT THIS DIET?  Choose low-purine foods. Examples of low-purine foods are listed in the next section.  Drink plenty of fluids, especially water. Fluids can help remove uric acid from your body. Try to drink 8-16 cups (1.9-3.8 L) a day.  Limit foods high in fat, especially saturated fat, as fat makes it harder for the body to get rid of uric acid. Foods high in saturated fat include pizza, cheese, ice cream, whole milk, fried foods, and gravies. Choose foods that are lower in fat and lean sources of protein. Use olive oil when cooking as it contains healthy fats that are not high in saturated fat.  Limit alcohol. Alcohol interferes with the elimination of uric acid from your body. If you are having a gout attack, avoid all alcohol.  Keep in mind that different people's bodies react differently to different foods. You will probably learn over time which foods do or do not affect you. If you discover that a food tends to cause your gout to flare up, avoid eating that food. You can more freely enjoy foods that do not cause problems. If you have any questions about a food item, talk to your dietitian or health care provider. WHICH FOODS ARE LOW, MODERATE, AND HIGH IN PURINES? The following is a list of foods that are low, moderate, and high in purines. You can eat any amount of the foods that are low in purines. You may be able to have small amounts of foods that are moderate in purines. Ask your health care provider how much of a food moderate in purines you can have. Avoid foods high in purines. Grains  Foods low in purines: Enriched white bread, pasta, rice, cake, cornbread, popcorn.  Foods moderate in  purines: Whole-grain breads and cereals, wheat germ, bran, oatmeal. Uncooked oatmeal. Dry wheat bran or wheat germ.  Foods high in purines: Pancakes, [place] toast, biscuits, muffins. Vegetables  Foods low in purines: All vegetables, except those that are moderate in purines.  Foods moderate in purines: Asparagus, cauliflower, spinach, mushrooms, green peas. Fruits  All fruits are low in purines. Meats and other Protein Foods  Foods low in purines: Eggs, nuts, peanut butter.  Foods moderate in purines: 80-90% lean beef, lamb, veal, pork, poultry, fish, eggs, peanut butter, nuts. Crab, lobster, oysters, and shrimp. Cooked dried beans, peas, and lentils.  Foods high in purines: Anchovies, sardines, herring, mussels, tuna, codfish, scallops, trout, and haddock. Edward Goodwin. Organ meats (such as liver or kidney). Tripe. Game meat. Goose. Sweetbreads. Dairy  All dairy foods are low in purines. Low-fat and fat-free dairy products are best because they are low in saturated fat. Beverages  Drinks low in purines: Water, carbonated beverages, tea, coffee, cocoa.  Drinks moderate in purines: Soft drinks and other drinks sweetened with high-fructose corn syrup. Juices. To find whether a food or drink is sweetened with high-fructose corn syrup, look at the ingredients list.  Drinks high in purines: Alcoholic beverages (such as beer). Condiments  Foods low in purines: Salt, herbs, olives, pickles, relishes, vinegar.  Foods moderate in purines: Butter, margarine, oils, mayonnaise. Fats and Oils  Foods low in purines: All types, except gravies  and sauces made with meat.  Foods high in purines: Gravies and sauces made with meat. Other Foods  Foods low in purines: Sugars, sweets, gelatin. Cake. Soups made without meat.  Foods moderate in purines: Meat-based or fish-based soups, broths, or bouillons. Foods and drinks sweetened with high-fructose corn syrup.  Foods high in purines: High-fat desserts  (such as ice cream, cookies, cakes, pies, doughnuts, and chocolate). Contact your dietitian for more information on foods that are not listed here. Document Released: 02/25/2011 Document Revised: 11/05/2013 Document Reviewed: 10/07/2013 [facility] Patient Information 2015 [place], [place]. This information is not intended to replace advice given to you by your health care provider. Make sure you discuss any questions you have with your health care provider. Gout Gout is when your joints become red, sore, and swell (inflamed). This is caused by the buildup of uric acid crystals in the joints. Uric acid is a chemical that is normally in the blood. If the level of uric acid gets too high in the blood, these crystals form in your joints and tissues. Over time, these crystals can form into masses near the joints and tissues. These masses can destroy bone and cause the bone to look misshapen (deformed). HOME CARE   Do not take aspirin for pain.  Only take medicine as told by your doctor.  Rest the joint as much as you can. When in bed, keep sheets and blankets off painful areas.  Keep the sore joints raised (elevated).  Put warm or cold packs on painful joints. Use of warm or cold packs depends on which works best for you.  Use crutches if the painful joint is in your leg.  Drink enough fluids to keep your pee (urine) clear or pale yellow. Limit alcohol, sugary drinks, and drinks with fructose in them.  Follow your diet instructions. Pay careful attention to how much protein you eat. Include fruits, vegetables, whole grains, and fat-free or low-fat milk products in your daily diet. Talk to your doctor or dietitian about the use of coffee, vitamin C, and cherries. These may help lower uric acid levels.  Keep a healthy body weight. GET HELP RIGHT AWAY IF:   You have watery poop (diarrhea), throw up (vomit), or have any side effects from medicines.  You do not feel better in 24 hours, or you are  getting worse.  Your joint becomes suddenly more tender, and you have chills or a fever. MAKE SURE YOU:   Understand these instructions.  Will watch your condition.  Will get help right away if you are not doing well or get worse. Document Released: 08/09/2008 Document Revised: 03/17/2014 Document Reviewed: 06/13/2012 [facility] Patient Information 2015 [place], [place]. This information is not intended to replace advice given to you by your health care provider. Make sure you discuss any questions you have with your health care provider.

## 2015-02-04 ENCOUNTER — Encounter: Payer: Self-pay | Admitting: Internal Medicine

## 2015-02-04 ENCOUNTER — Ambulatory Visit (INDEPENDENT_AMBULATORY_CARE_PROVIDER_SITE_OTHER): Payer: Managed Care, Other (non HMO) | Admitting: Internal Medicine

## 2015-02-04 DIAGNOSIS — M79604 Pain in right leg: Secondary | ICD-10-CM | POA: Diagnosis not present

## 2015-02-04 DIAGNOSIS — I10 Essential (primary) hypertension: Secondary | ICD-10-CM | POA: Diagnosis not present

## 2015-02-04 DIAGNOSIS — Z23 Encounter for immunization: Secondary | ICD-10-CM

## 2015-02-04 NOTE — Patient Instructions (Signed)
You  may move around, but avoid painful motions and activities.  Try gentle stretching 2-3 times daily

## 2015-02-04 NOTE — Progress Notes (Signed)
Pre visit review using our clinic review tool, if applicable. No additional management support is needed unless otherwise documented below in the visit note. 

## 2015-02-04 NOTE — Progress Notes (Signed)
Subjective:    Patient ID: Edward Goodwin, male    DOB: October 18, 1951, 64 y.o.   MRN: [id number]  HPI 64 year old patient who presents with a 2 to three-week history of right groin and proximal medial thigh discomfort.  No trauma or new unusual activities.  Pain seems to be paroxysmal.  Today no pain.  He was concerned about a possible blood clot History of hypertension which has been controlled on medications  Past Medical History  Diagnosis Date  . Hypertension   . Colitis, ulcerative   . Psoriasis   . Macular degeneration, bilateral 07/2012    History   Social History  . Marital Status: Married    Spouse Name: N/A  . Number of Children: 1  . Years of Education: 12   Occupational History  . insurance sales    Social History Main Topics  . Smoking status: Former Smoker    Quit date: 10/23/1989  . Smokeless tobacco: Never Used  . Alcohol Use: 0.0 oz/week    0 Standard drinks or equivalent per week     Comment: rare glass of wine - less than once a year  . Drug Use: No  . Sexual Activity: Not Currently   Other Topics Concern  . 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 Married 1971   I daughter 32; 1 grandchild 2008 - daughter divorced 2010. 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 Yes for acute CPR, no for prolonged mechanical ventilatory support, no for prolonged artificial nutrition or other heroic measures leaving him in an incapacitated state.     Past Surgical History  Procedure Laterality Date  . Tonsillectomy    . Acne cyst removal    . Shoulder arthroscopy  2007    Right  . Foot surgery  1988    Family History  Problem Relation Age of Onset  . Cancer Mother     Breast  . Heart disease Mother   . Heart disease Father     CAD/MI  . Diabetes Daughter   . Diabetes Maternal Uncle   . Diabetes Paternal Aunt     Allergies  Allergen Reactions  . Sulfonamide Derivatives       Current Outpatient Prescriptions on File Prior to Visit  Medication Sig Dispense Refill  . Ascorbic Acid (VITAMIN C PO) Take 1 tablet by mouth daily.    . clobetasol ointment (TEMOVATE) 4.31 % APPLY 1 APPLICATION 2 TIMES DAILY 30 g 5  . lisinopril (PRINIVIL,ZESTRIL) 20 MG tablet Take 1 tablet (20 mg total) by mouth daily. 90 tablet 3  . Multiple Vitamins-Minerals (PRESERVISION AREDS 2) CAPS Take 1 capsule by mouth 2 (two) times daily before lunch and supper.     No current facility-administered medications on file prior to visit.    BP 122/80 mmHg  Pulse 66  Temp(Src) 98 F (36.7 C) (Oral)  Resp 20  Ht 5' 9.25" (1.759 m)  Wt 262 lb (118.842 kg)  BMI 38.41 kg/m2  SpO2 97%      Review of Systems  Constitutional: Negative for fever, chills, appetite change and fatigue.  HENT: Negative for congestion, dental problem, ear pain, hearing loss, sore throat, tinnitus, trouble swallowing and voice change.   Eyes: Negative for pain, discharge and visual disturbance.  Respiratory: Negative for cough, chest tightness, wheezing and stridor.   Cardiovascular: Negative for chest pain, palpitations and leg swelling.  Gastrointestinal: Negative  for nausea, vomiting, abdominal pain, diarrhea, constipation, blood in stool and abdominal distention.  Genitourinary: Negative for urgency, hematuria, flank pain, discharge, difficulty urinating and genital sores.  Musculoskeletal: Negative for myalgias, back pain, joint swelling, arthralgias, gait problem and neck stiffness.       Right groin and leg pain  Skin: Negative for rash.  Neurological: Negative for dizziness, syncope, speech difficulty, weakness, numbness and headaches.  Hematological: Negative for adenopathy. Does not bruise/bleed easily.  Psychiatric/Behavioral: Negative for behavioral problems and dysphoric mood. The patient is not nervous/anxious.        Objective:   Physical Exam  Constitutional: He appears well-developed and  well-nourished. No distress.  Musculoskeletal:  No hernia or pathology in the right groin area Full range of motion of the right hip Right leg was normal without edema          Assessment & Plan:   Right leg pain.  Will check a d-dimer to exclude unlikely DVT Suspect musculoligamentous.  Will try stretching, Tylenol or Advil when necessary and will observe

## 2015-02-05 LAB — D-DIMER, QUANTITATIVE: D-Dimer, Quant: 0.27 ug/mL-FEU (ref 0.00–0.48)

## 2015-03-10 ENCOUNTER — Ambulatory Visit (INDEPENDENT_AMBULATORY_CARE_PROVIDER_SITE_OTHER): Payer: Managed Care, Other (non HMO) | Admitting: Family Medicine

## 2015-03-10 ENCOUNTER — Encounter: Payer: Self-pay | Admitting: Family Medicine

## 2015-03-10 ENCOUNTER — Encounter: Payer: Self-pay | Admitting: Internal Medicine

## 2015-03-10 VITALS — BP 151/78 | HR 63 | Temp 98.8°F | Ht 69.25 in | Wt 253.0 lb

## 2015-03-10 DIAGNOSIS — M7062 Trochanteric bursitis, left hip: Secondary | ICD-10-CM | POA: Diagnosis not present

## 2015-03-10 MED ORDER — METHYLPREDNISOLONE 4 MG PO TBPK
ORAL_TABLET | ORAL | Status: DC
Start: 2015-03-10 — End: 2015-05-08

## 2015-03-10 NOTE — Progress Notes (Signed)
   Subjective:    Patient ID: Edward Goodwin, male    DOB: 01-Sep-1951, 64 y.o.   MRN: [id number]  HPI Here for 3 days of pain in the left hip area. This started while he was hitting golf balls on the practice range. No specific hx of trauma. The area swelled a little so he applied heat when he got home and he took some Aleve. The area feels a little better today but is still stiff and sore.   Review of Systems  Constitutional: Negative.   Musculoskeletal: Positive for joint swelling, arthralgias and gait problem. Negative for back pain.       Objective:   Physical Exam  Constitutional: He appears well-developed and well-nourished. No distress.  Musculoskeletal:  He is tender over the left greater trochanter. The hips show full ROM           Assessment & Plan:  Bursitis. Rest, switch to ice. Given a Medrol dose pack. Recheck prn

## 2015-03-10 NOTE — Progress Notes (Signed)
Pre visit review using our clinic review tool, if applicable. No additional management support is needed unless otherwise documented below in the visit note. 

## 2015-03-31 ENCOUNTER — Encounter: Payer: Self-pay | Admitting: Family Medicine

## 2015-03-31 ENCOUNTER — Ambulatory Visit (INDEPENDENT_AMBULATORY_CARE_PROVIDER_SITE_OTHER): Payer: Managed Care, Other (non HMO) | Admitting: Family Medicine

## 2015-03-31 VITALS — BP 151/83 | HR 55 | Temp 98.4°F | Ht 69.25 in | Wt 248.0 lb

## 2015-03-31 DIAGNOSIS — M7062 Trochanteric bursitis, left hip: Secondary | ICD-10-CM

## 2015-03-31 NOTE — Progress Notes (Signed)
Pre visit review using our clinic review tool, if applicable. No additional management support is needed unless otherwise documented below in the visit note. 

## 2015-03-31 NOTE — Progress Notes (Signed)
   Subjective:    Patient ID: Edward Goodwin, male    DOB: 11-21-1950, 64 y.o.   MRN: [id number]  HPI Here to follow up on left hip pain that started 3 weeks ago. We saw him 2 weeks ago and diagnosed him with bursitis. He took a steroid taper and this helped a little, but the pain persists. Still no pain in the back or down the leg. Now taking one Aleve bid.    Review of Systems  Constitutional: Negative.   Musculoskeletal: Positive for arthralgias and gait problem. Negative for back pain.       Objective:   Physical Exam  Constitutional: He appears well-developed and well-nourished. No distress.  Musculoskeletal:  Tender over the left greater trochanter. No tenderness over the spine. The hip has full ROM           Assessment & Plan:  Trochanteric bursitis. Increase the Aleve to 2 tabs bid. Refer to orthopedics.

## 2015-05-08 ENCOUNTER — Ambulatory Visit (AMBULATORY_SURGERY_CENTER): Payer: Self-pay

## 2015-05-08 VITALS — Ht 69.5 in | Wt 239.4 lb

## 2015-05-08 DIAGNOSIS — Z860100 Personal history of colon polyps, unspecified: Secondary | ICD-10-CM

## 2015-05-08 NOTE — Progress Notes (Signed)
No allergies to eggs or soy No diet/weight loss meds No home oxygen No past problems to anesthesia  Has email  Emmi instructions given for colonoscopy

## 2015-05-22 ENCOUNTER — Encounter: Payer: Self-pay | Admitting: Internal Medicine

## 2015-05-22 ENCOUNTER — Ambulatory Visit (AMBULATORY_SURGERY_CENTER): Payer: Managed Care, Other (non HMO) | Admitting: Internal Medicine

## 2015-05-22 VITALS — BP 114/79 | HR 50 | Temp 96.0°F | Resp 25 | Ht 67.0 in | Wt 239.0 lb

## 2015-05-22 DIAGNOSIS — D122 Benign neoplasm of ascending colon: Secondary | ICD-10-CM

## 2015-05-22 DIAGNOSIS — Z1211 Encounter for screening for malignant neoplasm of colon: Secondary | ICD-10-CM | POA: Diagnosis present

## 2015-05-22 DIAGNOSIS — K635 Polyp of colon: Secondary | ICD-10-CM

## 2015-05-22 MED ORDER — SODIUM CHLORIDE 0.9 % IV SOLN: 500.0000 mL | INTRAVENOUS | Status: DC

## 2015-05-22 NOTE — Progress Notes (Signed)
Called to room to assist during endoscopic procedure.  Patient ID and intended procedure confirmed with present staff. Received instructions for my participation in the procedure from the performing physician.  

## 2015-05-22 NOTE — Patient Instructions (Addendum)
I found and removed one polyp that looks benign.  I will let you know pathology results and when to have another routine colonoscopy by mail. I appreciate the opportunity to care for you. Gatha Mayer, MD, FACG   YOU HAD AN ENDOSCOPIC PROCEDURE TODAY AT [facility]:   Refer to the procedure report that was given to you for any specific questions about what was found during the examination.  If the procedure report does not answer your questions, please call your gastroenterologist to clarify.  If you requested that your care partner not be given the details of your procedure findings, then the procedure report has been included in a sealed envelope for you to review at your convenience later.  YOU SHOULD EXPECT: Some feelings of bloating in the abdomen. Passage of more gas than usual.  Walking can help get rid of the air that was put into your GI tract during the procedure and reduce the bloating. If you had a lower endoscopy (such as a colonoscopy or flexible sigmoidoscopy) you may notice spotting of blood in your stool or on the toilet paper. If you underwent a bowel prep for your procedure, you may not have a normal bowel movement for a few days.  Please Note:  You might notice some irritation and congestion in your nose or some drainage.  This is from the oxygen used during your procedure.  There is no need for concern and it should clear up in a day or so.  SYMPTOMS TO REPORT IMMEDIATELY:   Following lower endoscopy (colonoscopy or flexible sigmoidoscopy):  Excessive amounts of blood in the stool  Significant tenderness or worsening of abdominal pains  Swelling of the abdomen that is new, acute  Fever of 100F or higher   For urgent or emergent issues, a gastroenterologist can be reached at any hour by calling [phone].   DIET: Your first meal following the procedure should be a small meal and then it is ok to progress to your normal diet. Heavy or  fried foods are harder to digest and may make you feel nauseous or bloated.  Likewise, meals heavy in dairy and vegetables can increase bloating.  Drink plenty of fluids but you should avoid alcoholic beverages for 24 hours.  ACTIVITY:  You should plan to take it easy for the rest of today and you should NOT DRIVE or use heavy machinery until tomorrow (because of the sedation medicines used during the test).    FOLLOW UP: Our staff will call the number listed on your records the next business day following your procedure to check on you and address any questions or concerns that you may have regarding the information given to you following your procedure. If we do not reach you, we will leave a message.  However, if you are feeling well and you are not experiencing any problems, there is no need to return our call.  We will assume that you have returned to your regular daily activities without incident.  Hold all aspirin and NSAIDS for two weeks per Dr. Carlean Purl. If any biopsies were taken you will be contacted by phone or by letter within the next 1-3 weeks.  Please call us at [phone] if you have not heard about the biopsies in 3 weeks.    SIGNATURES/CONFIDENTIALITY: You and/or your care partner have signed paperwork which will be entered into your electronic medical record.  These signatures attest to the fact that that the  information above on your After Visit Summary has been reviewed and is understood.  Full responsibility of the confidentiality of this discharge information lies with you and/or your care-partner.

## 2015-05-22 NOTE — Op Note (Signed)
[facility]  [address] [place], [id number]   COLONOSCOPY PROCEDURE REPORT  PATIENT: Edward Goodwin, Edward Goodwin  MR#: [id number] BIRTHDATE: 02/07/1951 , 62  yrs. old GENDER: male ENDOSCOPIST: Gatha Mayer, MD, [facility] PROCEDURE DATE:  05/22/2015 PROCEDURE:   Colonoscopy, screening and Colonoscopy with snare polypectomy First Screening Colonoscopy - Avg.  risk and is 50 yrs.  old or older - No.  Prior Negative Screening - Now for repeat screening. 10 or more years since last screening  History of Adenoma - Now for follow-up colonoscopy & has been > or = to 3 yrs.  N/A  Polyps removed today? Yes ASA CLASS:   Class II INDICATIONS:Screening for colonic neoplasia and Colorectal Neoplasm Risk Assessment for this procedure is average risk. MEDICATIONS: Propofol 300 mg IV and Monitored anesthesia care  DESCRIPTION OF PROCEDURE:   After the risks benefits and alternatives of the procedure were thoroughly explained, informed consent was obtained.  The digital rectal exam revealed no abnormalities of the rectum, revealed no prostatic nodules, and revealed the prostate was not enlarged.   The LB [id number] endoscope was introduced through the anus and advanced to the cecum, which was identified by both the appendix and ileocecal valve. No adverse events experienced.   The quality of the prep was good.  (MiraLax was used)  The instrument was then slowly withdrawn as the colon was fully examined. Estimated blood loss is zero unless otherwise noted in this procedure report.      COLON FINDINGS: A sessile polyp measuring 10 mm in size was found in the ascending colon.  A polypectomy was performed using snare cautery.  The resection was complete, the polyp tissue was completely retrieved and sent to histology. Some normal tissue was also snared in this process.  The examination was otherwise normal. Retroflexed views revealed no abnormalities. The time to cecum = 3.1 Withdrawal  time = 20.0   The scope was withdrawn and the procedure completed. COMPLICATIONS: There were no immediate complications.  ENDOSCOPIC IMPRESSION: 1.   Sessile polyp (5m)was found in the ascending colon; polypectomy was performed using snare cautery 2.   The examination was otherwise normal - good prep - second screening  RECOMMENDATIONS: 1.  Hold Aspirin and all other NSAIDS for 2 weeks. 2.  Timing of repeat colonoscopy will be determined by pathology findings.  eSigned:  CGatha Mayer MD, [facility]07/06/2015 9:08 AM   cc: The Patient

## 2015-05-22 NOTE — Progress Notes (Signed)
Transferred to recovery room. A/O x3, pleased with MAC.  VSS.  Report to Vinnie Level, Therapist, sports.

## 2015-05-25 ENCOUNTER — Telehealth: Payer: Self-pay | Admitting: *Deleted

## 2015-05-25 NOTE — Telephone Encounter (Signed)
  Follow up Call-  Call back number 05/22/2015  Post procedure Call Back phone  # cell [phone]  Permission to leave phone message Yes     Patient questions:  Do you have a fever, pain , or abdominal swelling? No. Pain Score  0 *  Have you tolerated food without any problems? Yes.    Have you been able to return to your normal activities? Yes.    Do you have any questions about your discharge instructions: Diet   No. Medications  No. Follow up visit  No.  Do you have questions or concerns about your Care? No.  Actions: * If pain score is 4 or above: No action needed, pain <4.

## 2015-06-01 ENCOUNTER — Encounter: Payer: Self-pay | Admitting: Internal Medicine

## 2015-06-01 NOTE — Progress Notes (Signed)
Quick Note:  Polypoid colonic mucosa - repeat colon screen 2026 ______

## 2015-08-28 ENCOUNTER — Other Ambulatory Visit: Payer: Self-pay | Admitting: Internal Medicine

## 2015-09-09 ENCOUNTER — Other Ambulatory Visit (INDEPENDENT_AMBULATORY_CARE_PROVIDER_SITE_OTHER): Payer: Managed Care, Other (non HMO)

## 2015-09-09 DIAGNOSIS — Z0000 Encounter for general adult medical examination without abnormal findings: Secondary | ICD-10-CM

## 2015-09-09 LAB — CBC WITH DIFFERENTIAL/PLATELET
BASOS ABS: 0 10*3/uL (ref 0.0–0.1)
Basophils Relative: 0.4 % (ref 0.0–3.0)
EOS ABS: 0.1 10*3/uL (ref 0.0–0.7)
Eosinophils Relative: 1.6 % (ref 0.0–5.0)
HEMATOCRIT: 44.3 % (ref 39.0–52.0)
Hemoglobin: 14.9 g/dL (ref 13.0–17.0)
LYMPHS PCT: 17.6 % (ref 12.0–46.0)
Lymphs Abs: 1 10*3/uL (ref 0.7–4.0)
MCHC: 33.7 g/dL (ref 30.0–36.0)
MCV: 86.5 fl (ref 78.0–100.0)
MONO ABS: 0.5 10*3/uL (ref 0.1–1.0)
Monocytes Relative: 8.8 % (ref 3.0–12.0)
NEUTROS ABS: 4.2 10*3/uL (ref 1.4–7.7)
NEUTROS PCT: 71.6 % (ref 43.0–77.0)
PLATELETS: 150 10*3/uL (ref 150.0–400.0)
RBC: 5.12 Mil/uL (ref 4.22–5.81)
RDW: 14.3 % (ref 11.5–15.5)
WBC: 5.9 10*3/uL (ref 4.0–10.5)

## 2015-09-09 LAB — POCT URINALYSIS DIPSTICK
Bilirubin, UA: NEGATIVE
Blood, UA: NEGATIVE
GLUCOSE UA: NEGATIVE
Ketones, UA: NEGATIVE
Leukocytes, UA: NEGATIVE
Nitrite, UA: NEGATIVE
SPEC GRAV UA: 1.02
UROBILINOGEN UA: 0.2
pH, UA: 5.5

## 2015-09-09 LAB — HEPATIC FUNCTION PANEL
ALK PHOS: 73 U/L (ref 39–117)
ALT: 13 U/L (ref 0–53)
AST: 14 U/L (ref 0–37)
Albumin: 4.3 g/dL (ref 3.5–5.2)
BILIRUBIN DIRECT: 0.2 mg/dL (ref 0.0–0.3)
BILIRUBIN TOTAL: 0.9 mg/dL (ref 0.2–1.2)
Total Protein: 6.4 g/dL (ref 6.0–8.3)

## 2015-09-09 LAB — LIPID PANEL
CHOLESTEROL: 156 mg/dL (ref 0–200)
HDL: 34.4 mg/dL — ABNORMAL LOW (ref >39.00)
LDL CALC: 105 mg/dL — AB (ref 0–99)
NonHDL: 121.44
TRIGLYCERIDES: 84 mg/dL (ref 0.0–149.0)
Total CHOL/HDL Ratio: 5
VLDL: 16.8 mg/dL (ref 0.0–40.0)

## 2015-09-09 LAB — BASIC METABOLIC PANEL
BUN: 19 mg/dL (ref 6–23)
CALCIUM: 9.4 mg/dL (ref 8.4–10.5)
CO2: 29 meq/L (ref 19–32)
CREATININE: 0.81 mg/dL (ref 0.40–1.50)
Chloride: 103 mEq/L (ref 96–112)
GFR: 101.23 mL/min (ref >60.00)
Glucose, Bld: 92 mg/dL (ref 70–99)
Potassium: 4.1 mEq/L (ref 3.5–5.1)
Sodium: 142 mEq/L (ref 135–145)

## 2015-09-09 LAB — TSH: TSH: 1.62 u[IU]/mL (ref 0.35–4.50)

## 2015-09-09 LAB — PSA: PSA: 2.32 ng/mL (ref 0.10–4.00)

## 2015-09-14 ENCOUNTER — Ambulatory Visit (INDEPENDENT_AMBULATORY_CARE_PROVIDER_SITE_OTHER): Payer: Managed Care, Other (non HMO) | Admitting: Internal Medicine

## 2015-09-14 ENCOUNTER — Encounter: Payer: Self-pay | Admitting: Internal Medicine

## 2015-09-14 VITALS — BP 140/80 | HR 59 | Temp 98.0°F | Resp 20 | Ht 69.0 in | Wt 219.0 lb

## 2015-09-14 DIAGNOSIS — Z0000 Encounter for general adult medical examination without abnormal findings: Secondary | ICD-10-CM

## 2015-09-14 DIAGNOSIS — I10 Essential (primary) hypertension: Secondary | ICD-10-CM | POA: Diagnosis not present

## 2015-09-14 NOTE — Patient Instructions (Signed)
Limit your sodium (Salt) intake  Please check your blood pressure on a regular basis.  If it is consistently greater than 150/90, please make an office appointment.    It is important that you exercise regularly, at least 20 minutes 3 to 4 times per week.  If you develop chest pain or shortness of breath seek  medical attention.  You need to lose weight.  Consider a lower calorie diet and regular exercise.  Health Maintenance, Male A healthy lifestyle and preventative care can promote health and wellness.  Maintain regular health, dental, and eye exams.  Eat a healthy diet. Foods like vegetables, fruits, whole grains, low-fat dairy products, and lean protein foods contain the nutrients you need and are low in calories. Decrease your intake of foods high in solid fats, added sugars, and salt. Get information about a proper diet from your health care provider, if necessary.  Regular physical exercise is one of the most important things you can do for your health. Most adults should get at least 150 minutes of moderate-intensity exercise (any activity that increases your heart rate and causes you to sweat) each week. In addition, most adults need muscle-strengthening exercises on 2 or more days a week.   Maintain a healthy weight. The body mass index (BMI) is a screening tool to identify possible weight problems. It provides an estimate of body fat based on height and weight. Your health care provider can find your BMI and can help you achieve or maintain a healthy weight. For males 20 years and older:  A BMI below 18.5 is considered underweight.  A BMI of 18.5 to 24.9 is normal.  A BMI of 25 to 29.9 is considered overweight.  A BMI of 30 and above is considered obese.  Maintain normal blood lipids and cholesterol by exercising and minimizing your intake of saturated fat. Eat a balanced diet with plenty of fruits and vegetables. Blood tests for lipids and cholesterol should begin at age 34  and be repeated every 5 years. If your lipid or cholesterol levels are high, you are over age 39, or you are at high risk for heart disease, you may need your cholesterol levels checked more frequently.Ongoing high lipid and cholesterol levels should be treated with medicines if diet and exercise are not working.  If you smoke, find out from your health care provider how to quit. If you do not use tobacco, do not start.  Lung cancer screening is recommended for adults aged 30-80 years who are at high risk for developing lung cancer because of a history of smoking. A yearly low-dose CT scan of the lungs is recommended for people who have at least a 30-pack-year history of smoking and are current smokers or have quit within the past 15 years. A pack year of smoking is smoking an average of 1 pack of cigarettes a day for 1 year (for example, a 30-pack-year history of smoking could mean smoking 1 pack a day for 30 years or 2 packs a day for 15 years). Yearly screening should continue until the smoker has stopped smoking for at least 15 years. Yearly screening should be stopped for people who develop a health problem that would prevent them from having lung cancer treatment.  If you choose to drink alcohol, do not have more than 2 drinks per day. One drink is considered to be 12 oz (360 mL) of beer, 5 oz (150 mL) of wine, or 1.5 oz (45 mL) of liquor.  Avoid  the use of street drugs. Do not share needles with anyone. Ask for help if you need support or instructions about stopping the use of drugs.  High blood pressure causes heart disease and increases the risk of stroke. High blood pressure is more likely to develop in:  People who have blood pressure in the end of the normal range (100-139/85-89 mm Hg).  People who are overweight or obese.  People who are African American.  If you are 16-19 years of age, have your blood pressure checked every 3-5 years. If you are 61 years of age or older, have your  blood pressure checked every year. You should have your blood pressure measured twice--once when you are at a hospital or clinic, and once when you are not at a hospital or clinic. Record the average of the two measurements. To check your blood pressure when you are not at a hospital or clinic, you can use:  An automated blood pressure machine at a pharmacy.  A home blood pressure monitor.  If you are 29-67 years old, ask your health care provider if you should take aspirin to prevent heart disease.  Diabetes screening involves taking a blood sample to check your fasting blood sugar level. This should be done once every 3 years after age 56 if you are at a normal weight and without risk factors for diabetes. Testing should be considered at a younger age or be carried out more frequently if you are overweight and have at least 1 risk factor for diabetes.  Colorectal cancer can be detected and often prevented. Most routine colorectal cancer screening begins at the age of 2 and continues through age 75. However, your health care provider may recommend screening at an earlier age if you have risk factors for colon cancer. On a yearly basis, your health care provider may provide home test kits to check for hidden blood in the stool. A small camera at the end of a tube may be used to directly examine the colon (sigmoidoscopy or colonoscopy) to detect the earliest forms of colorectal cancer. Talk to your health care provider about this at age 88 when routine screening begins. A direct exam of the colon should be repeated every 5-10 years through age 56, unless early forms of precancerous polyps or small growths are found.  People who are at an increased risk for hepatitis B should be screened for this virus. You are considered at high risk for hepatitis B if:  You were born in a country where hepatitis B occurs often. Talk with your health care provider about which countries are considered high risk.  Your  parents were born in a high-risk country and you have not received a shot to protect against hepatitis B (hepatitis B vaccine).  You have HIV or AIDS.  You use needles to inject street drugs.  You live with, or have sex with, someone who has hepatitis B.  You are a man who has sex with other men (MSM).  You get hemodialysis treatment.  You take certain medicines for conditions like cancer, organ transplantation, and autoimmune conditions.  Hepatitis C blood testing is recommended for all people born from [birth year] through 1967 and any individual with known risk factors for hepatitis C.  Healthy men should no longer receive prostate-specific antigen (PSA) blood tests as part of routine cancer screening. Talk to your health care provider about prostate cancer screening.  Testicular cancer screening is not recommended for adolescents or adult males who have  no symptoms. Screening includes self-exam, a health care provider exam, and other screening tests. Consult with your health care provider about any symptoms you have or any concerns you have about testicular cancer.  Practice safe sex. Use condoms and avoid high-risk sexual practices to reduce the spread of sexually transmitted infections (STIs).  You should be screened for STIs, including gonorrhea and chlamydia if:  You are sexually active and are younger than 24 years.  You are older than 24 years, and your health care provider tells you that you are at risk for this type of infection.  Your sexual activity has changed since you were last screened, and you are at an increased risk for chlamydia or gonorrhea. Ask your health care provider if you are at risk.  If you are at risk of being infected with HIV, it is recommended that you take a prescription medicine daily to prevent HIV infection. This is called pre-exposure prophylaxis (PrEP). You are considered at risk if:  You are a man who has sex with other men (MSM).  You are a  heterosexual man who is sexually active with multiple partners.  You take drugs by injection.  You are sexually active with a partner who has HIV.  Talk with your health care provider about whether you are at high risk of being infected with HIV. If you choose to begin PrEP, you should first be tested for HIV. You should then be tested every 3 months for as long as you are taking PrEP.  Use sunscreen. Apply sunscreen liberally and repeatedly throughout the day. You should seek shade when your shadow is shorter than you. Protect yourself by wearing long sleeves, pants, a wide-brimmed hat, and sunglasses year round whenever you are outdoors.  Tell your health care provider of new moles or changes in moles, especially if there is a change in shape or color. Also, tell your health care provider if a mole is larger than the size of a pencil eraser.  A one-time screening for abdominal aortic aneurysm (AAA) and surgical repair of large AAAs by ultrasound is recommended for men aged 4-75 years who are current or former smokers.  Stay current with your vaccines (immunizations).   This information is not intended to replace advice given to you by your health care provider. Make sure you discuss any questions you have with your health care provider.   Document Released: 04/28/2008 Document Revised: 11/21/2014 Document Reviewed: 03/28/2011 Elsevier Interactive Patient Education [employer].

## 2015-09-14 NOTE — Progress Notes (Signed)
Subjective:    Patient ID: Edward Goodwin, male    DOB: Aug 04, 1951, 64 y.o.   MRN: [id number]  HPI  64 year old seen today for a preventive health assessment.  He did have follow-up colonoscopy in July of this year No history of recent gout.  He has essential hypertension that has been stable  Past Medical History  Diagnosis Date  . Hypertension   . Colitis, ulcerative ([place])   . Psoriasis   . Macular degeneration, bilateral 07/2012    Social History   Social History  . Marital Status: Married    Spouse Name: N/A  . Number of Children: 1  . Years of Education: 12   Occupational History  . insurance sales    Social History Main Topics  . Smoking status: Former Smoker    Quit date: 10/23/1989  . Smokeless tobacco: Never Used  . Alcohol Use: 0.0 oz/week    0 Standard drinks or equivalent per week     Comment: rare glass of wine - less than once a year  . Drug Use: No  . Sexual Activity: Not Currently   Other Topics Concern  . 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 [employer] 6 years. Married 1971   I daughter 77; 1 grandchild 2008 - daughter divorced 2010. 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 Yes for acute CPR, no for prolonged mechanical ventilatory support, no for prolonged artificial nutrition or other heroic measures leaving him in an incapacitated state.     Past Surgical History  Procedure Laterality Date  . Tonsillectomy    . Acne cyst removal    . Shoulder arthroscopy  2007    Right  . Foot surgery  1988  . Cataract extraction w/ intraocular lens implant  10/2014    bilat  . Colonoscopy      Family History  Problem Relation Age of Onset  . Cancer Mother     Breast  . Heart disease Mother   . Heart disease Father     CAD/MI  . Diabetes Daughter   . Diabetes Maternal Uncle   . Diabetes Paternal Aunt   . Colon cancer Neg Hx   . Esophageal cancer Neg Hx   . Stomach cancer Neg Hx    . Rectal cancer Neg Hx     Allergies  Allergen Reactions  . Sulfonamide Derivatives     "have just been told that my entire life"    Current Outpatient Prescriptions on File Prior to Visit  Medication Sig Dispense Refill  . Ascorbic Acid (VITAMIN C PO) Take 1 tablet by mouth daily. 1000 mg    . clobetasol ointment (TEMOVATE) 3.08 % APPLY 1 APPLICATION 2 TIMES DAILY 30 g 5  . lisinopril (PRINIVIL,ZESTRIL) 20 MG tablet TAKE 1 TABLET (20 MG TOTAL) BY MOUTH DAILY. 90 tablet 0  . Multiple Vitamins-Minerals (PRESERVISION AREDS 2) CAPS Take 1 capsule by mouth 2 (two) times daily before lunch and supper.     No current facility-administered medications on file prior to visit.    BP 140/80 mmHg  Pulse 59  Temp(Src) 98 F (36.7 C) (Oral)  Resp 20  Ht 5' 9"  (1.753 m)  Wt 219 lb (99.338 kg)  BMI 32.33 kg/m2  SpO2 98%     Review of Systems  Constitutional: Negative for fever, chills, appetite change and fatigue.  HENT: Negative for congestion, dental problem, ear pain, hearing loss, sore  throat, tinnitus, trouble swallowing and voice change.   Eyes: Negative for pain, discharge and visual disturbance.  Respiratory: Negative for cough, chest tightness, wheezing and stridor.   Cardiovascular: Negative for chest pain, palpitations and leg swelling.  Gastrointestinal: Negative for nausea, vomiting, abdominal pain, diarrhea, constipation, blood in stool and abdominal distention.  Genitourinary: Negative for urgency, hematuria, flank pain, discharge, difficulty urinating and genital sores.  Musculoskeletal: Positive for myalgias and arthralgias. Negative for back pain, joint swelling, gait problem and neck stiffness.  Skin: Negative for rash.  Neurological: Negative for dizziness, syncope, speech difficulty, weakness, numbness and headaches.  Hematological: Negative for adenopathy. Does not bruise/bleed easily.  Psychiatric/Behavioral: Negative for behavioral problems and dysphoric mood.  The patient is not nervous/anxious.        Objective:   Physical Exam  Constitutional: He is oriented to person, place, and time. He appears well-developed and well-nourished.  Blood pressure 126/72  HENT:  Head: Normocephalic and atraumatic.  Right Ear: External ear normal.  Left Ear: External ear normal.  Nose: Nose normal.  Mouth/Throat: Oropharynx is clear and moist.  Eyes: Conjunctivae and EOM are normal. Pupils are equal, round, and reactive to light. No scleral icterus.  Neck: Normal range of motion. Neck supple. No JVD present. No thyromegaly present.  Cardiovascular: Normal rate, regular rhythm, normal heart sounds and intact distal pulses.  Exam reveals no gallop and no friction rub.   No murmur heard. Pulmonary/Chest: Effort normal and breath sounds normal. He exhibits no tenderness.  Abdominal: Soft. Bowel sounds are normal. He exhibits no distension and no mass. There is no tenderness.  Genitourinary: Prostate normal and penis normal.  Musculoskeletal: Normal range of motion. He exhibits no edema or tenderness.  Lymphadenopathy:    He has no cervical adenopathy.  Neurological: He is alert and oriented to person, place, and time. He has normal reflexes. No cranial nerve deficit. Coordination normal.  Skin: Skin is warm and dry. No rash noted.  Psychiatric: He has a normal mood and affect. His behavior is normal.          Assessment & Plan:  Preventive health exam Hypertension, stable Obesity.  Patient has had a nice voluntary weight loss of almost 50 pounds over the past year with decrease snacking and caloric intake. History of gout.  Stable  Continue home blood pressure monitoring efforts at additional weight loss Low-salt diet recommended  Recheck one year or as needed

## 2015-09-14 NOTE — Progress Notes (Signed)
Pre visit review using our clinic review tool, if applicable. No additional management support is needed unless otherwise documented below in the visit note. 

## 2015-11-19 ENCOUNTER — Other Ambulatory Visit: Payer: Self-pay | Admitting: Internal Medicine

## 2015-12-31 ENCOUNTER — Other Ambulatory Visit: Payer: Self-pay | Admitting: Internal Medicine

## 2016-06-24 ENCOUNTER — Other Ambulatory Visit: Payer: Self-pay | Admitting: Internal Medicine

## 2016-07-26 ENCOUNTER — Encounter: Payer: Self-pay | Admitting: Internal Medicine

## 2016-07-26 ENCOUNTER — Ambulatory Visit (INDEPENDENT_AMBULATORY_CARE_PROVIDER_SITE_OTHER): Payer: Managed Care, Other (non HMO) | Admitting: Internal Medicine

## 2016-07-26 VITALS — BP 122/80 | HR 67 | Temp 98.2°F | Resp 20 | Ht 69.0 in | Wt 210.0 lb

## 2016-07-26 DIAGNOSIS — J069 Acute upper respiratory infection, unspecified: Secondary | ICD-10-CM | POA: Diagnosis not present

## 2016-07-26 DIAGNOSIS — I10 Essential (primary) hypertension: Secondary | ICD-10-CM | POA: Diagnosis not present

## 2016-07-26 MED ORDER — FLUTICASONE PROPIONATE 50 MCG/ACT NA SUSP: 2.0000 | Freq: Every day | NASAL | 6 refills | Status: DC

## 2016-07-26 MED ORDER — AMOXICILLIN-POT CLAVULANATE 875-125 MG PO TABS: 1.0000 | ORAL_TABLET | Freq: Two times a day (BID) | ORAL | 0 refills | Status: DC

## 2016-07-26 NOTE — Progress Notes (Signed)
Pre visit review using our clinic review tool, if applicable. No additional management support is needed unless otherwise documented below in the visit note. 

## 2016-07-26 NOTE — Patient Instructions (Addendum)
Acute sinusitis symptoms for less than 14 days are generally not helped by antibiotic therapy.  Use saline irrigation, warm  moist compresses and over-the-counter decongestants only as directed.  Call if there is no improvement in 5 to 7 days, or sooner if you develop increasing pain, fever, or any new symptoms.  HOME CARE INSTRUCTIONS  Drink plenty of water. Water helps thin the mucus so your sinuses can drain more easily.  Use a humidifier.  Inhale steam 3-4 times a day (for example, sit in the bathroom with the shower running).  Apply a warm, moist washcloth to your face 3-4 times a day, or as directed by your health care provider.  Use saline nasal sprays to help moisten and clean your sinuses.  Take medicines only as directed by your health care provider.  If you were prescribed either an antibiotic or antifungal medicine, finish it all even if you start to feel better.

## 2016-07-26 NOTE — Progress Notes (Signed)
Subjective:    Patient ID: Edward Goodwin, male    DOB: Apr 21, 1951, 65 y.o.   MRN: [id number]  HPI  65 year old patient who presents with a ten-day history of nasal chest congestion, cough.  He describes some sinus fullness and pressure.  Denies any fever.  He feels unwell.  He has tried Mucinex DM and Sudafed.  He has tried [employer] pot treatment over the past without much benefit.  No focal sinus pain.  No history of prior sinusitis  Past Medical History:  Diagnosis Date  . Colitis, ulcerative ([place])   . Hypertension   . Macular degeneration, bilateral 07/2012  . Psoriasis      Social History   Social History  . Marital status: Married    Spouse name: N/A  . Number of children: 1  . Years of education: 60   Occupational History  . insurance sales    Social History Main Topics  . Smoking status: Former Smoker    Quit date: 10/23/1989  . Smokeless tobacco: Never Used  . Alcohol use 0.0 oz/week     Comment: rare glass of wine - less than once a year  . Drug use: No  . Sexual activity: Not Currently   Other Topics Concern  . 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 [employer] 6 years. Married 1971   I daughter 40; 1 grandchild 2008 - daughter divorced 2010. 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 Yes for acute CPR, no for prolonged mechanical ventilatory support, no for prolonged artificial nutrition or other heroic measures leaving him in an incapacitated state.     Past Surgical History:  Procedure Laterality Date  . ACNE CYST REMOVAL    . CATARACT EXTRACTION W/ INTRAOCULAR LENS IMPLANT  10/2014   bilat  . COLONOSCOPY    . FOOT SURGERY  1988  . SHOULDER ARTHROSCOPY  2007   Right  . TONSILLECTOMY      Family History  Problem Relation Age of Onset  . Cancer Mother     Breast  . Heart disease Mother   . Heart disease Father     CAD/MI  . Diabetes Daughter   . Diabetes Maternal Uncle   . Diabetes  Paternal Aunt   . Colon cancer Neg Hx   . Esophageal cancer Neg Hx   . Stomach cancer Neg Hx   . Rectal cancer Neg Hx     Allergies  Allergen Reactions  . Sulfonamide Derivatives     "have just been told that my entire life"    Current Outpatient Prescriptions on File Prior to Visit  Medication Sig Dispense Refill  . Ascorbic Acid (VITAMIN C PO) Take 1 tablet by mouth daily. 1000 mg    . clobetasol ointment (TEMOVATE) 4.48 % APPLY 1 APPLICATION 2 TIMES DAILY 30 g 4  . lisinopril (PRINIVIL,ZESTRIL) 20 MG tablet TAKE 1 TABLET (20 MG TOTAL) BY MOUTH DAILY. 90 tablet 1  . Multiple Vitamins-Minerals (PRESERVISION AREDS 2) CAPS Take 1 capsule by mouth 2 (two) times daily before lunch and supper.     No current facility-administered medications on file prior to visit.     BP 122/80 (BP Location: Left Arm, Patient Position: Sitting, Cuff Size: Normal)   Pulse 67   Temp 98.2 F (36.8 C) (Oral)   Resp 20   Ht 5' 9"  (1.753 m)   Wt 210 lb (95.3 kg)   SpO2 98%  BMI 31.01 kg/m     Review of Systems  Constitutional: Positive for activity change, appetite change, chills and fatigue. Negative for fever.  HENT: Positive for congestion, postnasal drip, rhinorrhea and sinus pressure. Negative for dental problem, ear pain, hearing loss, sore throat, tinnitus, trouble swallowing and voice change.   Eyes: Negative for pain, discharge and visual disturbance.  Respiratory: Positive for cough. Negative for chest tightness, wheezing and stridor.   Cardiovascular: Negative for chest pain, palpitations and leg swelling.  Gastrointestinal: Negative for abdominal distention, abdominal pain, blood in stool, constipation, diarrhea, nausea and vomiting.  Genitourinary: Negative for difficulty urinating, discharge, flank pain, genital sores, hematuria and urgency.  Musculoskeletal: Negative for arthralgias, back pain, gait problem, joint swelling, myalgias and neck stiffness.  Skin: Negative for rash.    Neurological: Negative for dizziness, syncope, speech difficulty, weakness, numbness and headaches.  Hematological: Negative for adenopathy. Does not bruise/bleed easily.  Psychiatric/Behavioral: Negative for behavioral problems and dysphoric mood. The patient is not nervous/anxious.        Objective:   Physical Exam  Constitutional: He is oriented to person, place, and time. He appears well-developed.  Afebrile No distress Blood pressure well controlled  HENT:  Head: Normocephalic.  Right Ear: External ear normal.  Left Ear: External ear normal.  No sinus tenderness  Eyes: Conjunctivae and EOM are normal.  Neck: Normal range of motion.  Cardiovascular: Normal rate and normal heart sounds.   Pulmonary/Chest: Breath sounds normal.  Abdominal: Bowel sounds are normal.  Musculoskeletal: Normal range of motion. He exhibits no edema or tenderness.  Neurological: He is alert and oriented to person, place, and time.  Psychiatric: He has a normal mood and affect. His behavior is normal.          Assessment & Plan:   Viral URI.  Will continue symptomatic treatments.  We will add the saline irrigation as well as fluticasone nasal spray. Antibiotic therapy will be held unless he develops fever, focal sinus pain. Hypertension, stable  Nyoka Cowden

## 2016-07-28 ENCOUNTER — Telehealth: Payer: Self-pay | Admitting: Internal Medicine

## 2016-07-28 NOTE — Telephone Encounter (Signed)
Patient Name: Edward Goodwin DOB: 1951-04-13 Initial Comment Caller states he was up all night coughing. Nurse Assessment Nurse: Ronnald Ramp, RN, Miranda Date/Time (Eastern Time): 07/28/2016 9:20:16 AM Confirm and document reason for call. If symptomatic, describe symptoms. You must click the next button to save text entered. ---Caller states she was seen on Tuesday and diagnosed with sinus infection and prescribed antibiotics. He has been coughing and it was really bad last night. The cough is productive. Has the patient traveled out of the country within the last 30 days? ---No Does the patient have any new or worsening symptoms? ---Yes Will a triage be completed? ---Yes Related visit to physician within the last 2 weeks? ---Yes Does the PT have any chronic conditions? (i.e. diabetes, asthma, etc.) ---Yes List chronic conditions. ---HTN Is this a behavioral health or substance abuse call? ---No Guidelines Guideline Title Affirmed Question Affirmed Notes Cough - Acute Productive SEVERE coughing spells (e.g., whooping sound after coughing, vomiting after coughing) Final Disposition User See Physician within 24 Hours Jones, Therapist, sports, [employer] declined care advice. He just wants a message sent to the doctor to see if he will call in prescription for cough medication. Told caller I would forward the message but the MD may want to see him first. Caller declined appt. Referrals GO TO FACILITY REFUSED Disagree/Comply: Disagree Disagree/Comply Reason: Disagree with instructions

## 2016-07-28 NOTE — Telephone Encounter (Signed)
Spoke to pt, told him Dr.K is out of the office and I can not call anything in till I discuss with him. Asked pt if he has tried OTC cough medicine? Pt said yes and it is not helping, cough worse at night when he lays down. Told pt I will discuss first thing in the morning and get back to him. Pt verbalized understanding.

## 2016-08-09 ENCOUNTER — Telehealth: Payer: Self-pay | Admitting: Internal Medicine

## 2016-08-09 NOTE — Telephone Encounter (Signed)
Pt needs a cpx before end of dec 2017. Can I create 30 min slot?

## 2016-08-09 NOTE — Telephone Encounter (Signed)
Yes, that is fine. 

## 2016-08-09 NOTE — Telephone Encounter (Signed)
Pt has been sch

## 2016-09-12 ENCOUNTER — Other Ambulatory Visit (INDEPENDENT_AMBULATORY_CARE_PROVIDER_SITE_OTHER): Payer: Managed Care, Other (non HMO)

## 2016-09-12 DIAGNOSIS — Z0000 Encounter for general adult medical examination without abnormal findings: Secondary | ICD-10-CM

## 2016-09-12 LAB — CBC WITH DIFFERENTIAL/PLATELET
BASOS ABS: 0 10*3/uL (ref 0.0–0.1)
Basophils Relative: 0.3 % (ref 0.0–3.0)
EOS PCT: 1.5 % (ref 0.0–5.0)
Eosinophils Absolute: 0.1 10*3/uL (ref 0.0–0.7)
HCT: 44.1 % (ref 39.0–52.0)
HEMOGLOBIN: 15.1 g/dL (ref 13.0–17.0)
Lymphocytes Relative: 19.2 % (ref 12.0–46.0)
Lymphs Abs: 1.2 10*3/uL (ref 0.7–4.0)
MCHC: 34.3 g/dL (ref 30.0–36.0)
MCV: 85.3 fl (ref 78.0–100.0)
MONO ABS: 0.5 10*3/uL (ref 0.1–1.0)
Monocytes Relative: 7.9 % (ref 3.0–12.0)
Neutro Abs: 4.3 10*3/uL (ref 1.4–7.7)
Neutrophils Relative %: 71.1 % (ref 43.0–77.0)
Platelets: 161 10*3/uL (ref 150.0–400.0)
RBC: 5.17 Mil/uL (ref 4.22–5.81)
RDW: 14 % (ref 11.5–15.5)
WBC: 6.1 10*3/uL (ref 4.0–10.5)

## 2016-09-12 LAB — POC URINALSYSI DIPSTICK (AUTOMATED)
BILIRUBIN UA: NEGATIVE
GLUCOSE UA: NEGATIVE
KETONES UA: NEGATIVE
Leukocytes, UA: NEGATIVE
Nitrite, UA: NEGATIVE
PH UA: 6
Protein, UA: NEGATIVE
RBC UA: NEGATIVE
SPEC GRAV UA: 1.02
Urobilinogen, UA: 0.2

## 2016-09-12 LAB — HEPATIC FUNCTION PANEL
ALK PHOS: 62 U/L (ref 39–117)
ALT: 9 U/L (ref 0–53)
AST: 12 U/L (ref 0–37)
Albumin: 4.4 g/dL (ref 3.5–5.2)
BILIRUBIN DIRECT: 0.2 mg/dL (ref 0.0–0.3)
Total Bilirubin: 0.9 mg/dL (ref 0.2–1.2)
Total Protein: 6.5 g/dL (ref 6.0–8.3)

## 2016-09-12 LAB — BASIC METABOLIC PANEL
BUN: 17 mg/dL (ref 6–23)
CO2: 31 mEq/L (ref 19–32)
Calcium: 9.4 mg/dL (ref 8.4–10.5)
Chloride: 104 mEq/L (ref 96–112)
Creatinine, Ser: 0.86 mg/dL (ref 0.40–1.50)
GFR: 94.18 mL/min (ref >60.00)
Glucose, Bld: 92 mg/dL (ref 70–99)
POTASSIUM: 4.3 meq/L (ref 3.5–5.1)
SODIUM: 142 meq/L (ref 135–145)

## 2016-09-12 LAB — LIPID PANEL
CHOL/HDL RATIO: 4
CHOLESTEROL: 163 mg/dL (ref 0–200)
HDL: 42.7 mg/dL (ref >39.00)
LDL CALC: 100 mg/dL — AB (ref 0–99)
NONHDL: 120.47
Triglycerides: 101 mg/dL (ref 0.0–149.0)
VLDL: 20.2 mg/dL (ref 0.0–40.0)

## 2016-09-12 LAB — TSH: TSH: 1.11 u[IU]/mL (ref 0.35–4.50)

## 2016-09-12 LAB — PSA: PSA: 2.13 ng/mL (ref 0.10–4.00)

## 2016-09-23 ENCOUNTER — Encounter: Payer: Self-pay | Admitting: Internal Medicine

## 2016-09-23 ENCOUNTER — Ambulatory Visit (INDEPENDENT_AMBULATORY_CARE_PROVIDER_SITE_OTHER): Payer: Managed Care, Other (non HMO) | Admitting: Internal Medicine

## 2016-09-23 VITALS — BP 146/90 | HR 58 | Temp 98.1°F | Resp 20 | Ht 68.5 in | Wt 208.0 lb

## 2016-09-23 DIAGNOSIS — I10 Essential (primary) hypertension: Secondary | ICD-10-CM | POA: Diagnosis not present

## 2016-09-23 DIAGNOSIS — Z0000 Encounter for general adult medical examination without abnormal findings: Secondary | ICD-10-CM | POA: Diagnosis not present

## 2016-09-23 NOTE — Progress Notes (Signed)
Subjective:    Patient ID: Edward Goodwin, male    DOB: 1951-10-16, 65 y.o.   MRN: [id number]  HPI  65 year old patient who is seen today for a preventive health examination.  He has essential hypertension.  He has done quite well.  Did have colonoscopy performed 2016 Since his last annual exam.  He has had bilateral cataract extraction surgery.  Past Medical History:  Diagnosis Date  . Colitis, ulcerative ([place])   . Hypertension   . Macular degeneration, bilateral 07/2012  . Psoriasis      Social History   Social History  . Marital status: Married    Spouse name: N/A  . Number of children: 1  . Years of education: 28   Occupational History  . insurance sales    Social History Main Topics  . Smoking status: Former Smoker    Quit date: 10/23/1989  . Smokeless tobacco: Never Used  . Alcohol use 0.0 oz/week     Comment: rare glass of wine - less than once a year  . Drug use: No  . Sexual activity: Not Currently   Other Topics Concern  . 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 Married 1971   I daughter 36; 1 grandchild 2008 - daughter divorced 2010. 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 Yes for acute CPR, no for prolonged mechanical ventilatory support, no for prolonged artificial nutrition or other heroic measures leaving him in an incapacitated state.     Past Surgical History:  Procedure Laterality Date  . ACNE CYST REMOVAL    . CATARACT EXTRACTION W/ INTRAOCULAR LENS IMPLANT  10/2014   bilat  . COLONOSCOPY    . FOOT SURGERY  1988  . SHOULDER ARTHROSCOPY  2007   Right  . TONSILLECTOMY      Family History  Problem Relation Age of Onset  . Cancer Mother     Breast  . Heart disease Mother   . Heart disease Father     CAD/MI  . Diabetes Daughter   . Diabetes Maternal Uncle   . Diabetes Paternal Aunt   . Colon cancer Neg Hx   . Esophageal cancer Neg Hx   . Stomach  cancer Neg Hx   . Rectal cancer Neg Hx     Allergies  Allergen Reactions  . Sulfonamide Derivatives     "have just been told that my entire life"    Current Outpatient Prescriptions on File Prior to Visit  Medication Sig Dispense Refill  . Ascorbic Acid (VITAMIN C PO) Take 1 tablet by mouth daily. 1000 mg    . clobetasol ointment (TEMOVATE) 1.69 % APPLY 1 APPLICATION 2 TIMES DAILY 30 g 4  . lisinopril (PRINIVIL,ZESTRIL) 20 MG tablet TAKE 1 TABLET (20 MG TOTAL) BY MOUTH DAILY. 90 tablet 1  . Multiple Vitamins-Minerals (PRESERVISION AREDS 2) CAPS Take 1 capsule by mouth 2 (two) times daily before lunch and supper.     No current facility-administered medications on file prior to visit.     BP (!) 146/90 (BP Location: Left Arm, Patient Position: Sitting, Cuff Size: Normal)   Pulse (!) 58   Temp 98.1 F (36.7 C) (Oral)   Resp 20   Ht 5' 8.5" (1.74 m)   Wt 208 lb (94.3 kg)   SpO2 99%   BMI 31.17 kg/m     Review of Systems  Constitutional: Negative for appetite change,  chills, fatigue and fever.  HENT: Negative for congestion, dental problem, ear pain, hearing loss, sore throat, tinnitus, trouble swallowing and voice change.   Eyes: Negative for pain, discharge and visual disturbance.  Respiratory: Negative for cough, chest tightness, wheezing and stridor.   Cardiovascular: Negative for chest pain, palpitations and leg swelling.  Gastrointestinal: Negative for abdominal distention, abdominal pain, blood in stool, constipation, diarrhea, nausea and vomiting.  Genitourinary: Negative for difficulty urinating, discharge, flank pain, genital sores, hematuria and urgency.  Musculoskeletal: Negative for arthralgias, back pain, gait problem, joint swelling, myalgias and neck stiffness.  Skin: Negative for rash.  Neurological: Negative for dizziness, syncope, speech difficulty, weakness, numbness and headaches.  Hematological: Negative for adenopathy. Does not bruise/bleed easily.    Psychiatric/Behavioral: Negative for behavioral problems and dysphoric mood. The patient is not nervous/anxious.        Objective:   Physical Exam  Constitutional: He appears well-developed and well-nourished.  HENT:  Head: Normocephalic and atraumatic.  Right Ear: External ear normal.  Left Ear: External ear normal.  Nose: Nose normal.  Mouth/Throat: Oropharynx is clear and moist.  Eyes: Conjunctivae and EOM are normal. Pupils are equal, round, and reactive to light. No scleral icterus.  Neck: Normal range of motion. Neck supple. No JVD present. No thyromegaly present.  Cardiovascular: Regular rhythm, normal heart sounds and intact distal pulses.  Exam reveals no gallop and no friction rub.   No murmur heard. Pulmonary/Chest: Effort normal and breath sounds normal. He exhibits no tenderness.  Abdominal: Soft. Bowel sounds are normal. He exhibits no distension and no mass. There is no tenderness.  Genitourinary: Penis normal. Rectal exam shows guaiac negative stool.  Genitourinary Comments: Minimal prostate enlargement  Musculoskeletal: Normal range of motion. He exhibits no edema or tenderness.  Lymphadenopathy:    He has no cervical adenopathy.  Neurological: He is alert. He has normal reflexes. No cranial nerve deficit. Coordination normal.  Skin: Skin is warm and dry. No rash noted.  Psychiatric: He has a normal mood and affect. His behavior is normal.          Assessment & Plan:   Preventive health examination.  Immunizations updated Laboratory studies reviewed  Essential hypertension.  Will continue home blood pressure monitoring.  No change in regimen.  Nyoka Cowden

## 2016-09-23 NOTE — Patient Instructions (Addendum)
Limit your sodium (Salt) intake  Please check your blood pressure on a regular basis.  If it is consistently greater than 150/90, please make an office appointment.  Health Maintenance, Male A healthy lifestyle and preventative care can promote health and wellness.  Maintain regular health, dental, and eye exams.  Eat a healthy diet. Foods like vegetables, fruits, whole grains, low-fat dairy products, and lean protein foods contain the nutrients you need and are low in calories. Decrease your intake of foods high in solid fats, added sugars, and salt. Get information about a proper diet from your health care provider, if necessary.  Regular physical exercise is one of the most important things you can do for your health. Most adults should get at least 150 minutes of moderate-intensity exercise (any activity that increases your heart rate and causes you to sweat) each week. In addition, most adults need muscle-strengthening exercises on 2 or more days a week.   Maintain a healthy weight. The body mass index (BMI) is a screening tool to identify possible weight problems. It provides an estimate of body fat based on height and weight. Your health care provider can find your BMI and can help you achieve or maintain a healthy weight. For males 20 years and older:  A BMI below 18.5 is considered underweight.  A BMI of 18.5 to 24.9 is normal.  A BMI of 25 to 29.9 is considered overweight.  A BMI of 30 and above is considered obese.  Maintain normal blood lipids and cholesterol by exercising and minimizing your intake of saturated fat. Eat a balanced diet with plenty of fruits and vegetables. Blood tests for lipids and cholesterol should begin at age 75 and be repeated every 5 years. If your lipid or cholesterol levels are high, you are over age 5, or you are at high risk for heart disease, you may need your cholesterol levels checked more frequently.Ongoing high lipid and cholesterol levels  should be treated with medicines if diet and exercise are not working.  If you smoke, find out from your health care provider how to quit. If you do not use tobacco, do not start.  Lung cancer screening is recommended for adults aged 36-80 years who are at high risk for developing lung cancer because of a history of smoking. A yearly low-dose CT scan of the lungs is recommended for people who have at least a 30-pack-year history of smoking and are current smokers or have quit within the past 15 years. A pack year of smoking is smoking an average of 1 pack of cigarettes a day for 1 year (for example, a 30-pack-year history of smoking could mean smoking 1 pack a day for 30 years or 2 packs a day for 15 years). Yearly screening should continue until the smoker has stopped smoking for at least 15 years. Yearly screening should be stopped for people who develop a health problem that would prevent them from having lung cancer treatment.  If you choose to drink alcohol, do not have more than 2 drinks per day. One drink is considered to be 12 oz (360 mL) of beer, 5 oz (150 mL) of wine, or 1.5 oz (45 mL) of liquor.  Avoid the use of street drugs. Do not share needles with anyone. Ask for help if you need support or instructions about stopping the use of drugs.  High blood pressure causes heart disease and increases the risk of stroke. High blood pressure is more likely to develop in:  People who have blood pressure in the end of the normal range (100-139/85-89 mm Hg).  People who are overweight or obese.  People who are African American.  If you are 32-23 years of age, have your blood pressure checked every 3-5 years. If you are 73 years of age or older, have your blood pressure checked every year. You should have your blood pressure measured twice--once when you are at a hospital or clinic, and once when you are not at a hospital or clinic. Record the average of the two measurements. To check your blood  pressure when you are not at a hospital or clinic, you can use:  An automated blood pressure machine at a pharmacy.  A home blood pressure monitor.  If you are 39-73 years old, ask your health care provider if you should take aspirin to prevent heart disease.  Diabetes screening involves taking a blood sample to check your fasting blood sugar level. This should be done once every 3 years after age 34 if you are at a normal weight and without risk factors for diabetes. Testing should be considered at a younger age or be carried out more frequently if you are overweight and have at least 1 risk factor for diabetes.  Colorectal cancer can be detected and often prevented. Most routine colorectal cancer screening begins at the age of 62 and continues through age 46. However, your health care provider may recommend screening at an earlier age if you have risk factors for colon cancer. On a yearly basis, your health care provider may provide home test kits to check for hidden blood in the stool. A small camera at the end of a tube may be used to directly examine the colon (sigmoidoscopy or colonoscopy) to detect the earliest forms of colorectal cancer. Talk to your health care provider about this at age 81 when routine screening begins. A direct exam of the colon should be repeated every 5-10 years through age 49, unless early forms of precancerous polyps or small growths are found.  People who are at an increased risk for hepatitis B should be screened for this virus. You are considered at high risk for hepatitis B if:  You were born in a country where hepatitis B occurs often. Talk with your health care provider about which countries are considered high risk.  Your parents were born in a high-risk country and you have not received a shot to protect against hepatitis B (hepatitis B vaccine).  You have HIV or AIDS.  You use needles to inject street drugs.  You live with, or have sex with, someone who  has hepatitis B.  You are a man who has sex with other men (MSM).  You get hemodialysis treatment.  You take certain medicines for conditions like cancer, organ transplantation, and autoimmune conditions.  Hepatitis C blood testing is recommended for all people born from [birth year] through 1967 and any individual with known risk factors for hepatitis C.  Healthy men should no longer receive prostate-specific antigen (PSA) blood tests as part of routine cancer screening. Talk to your health care provider about prostate cancer screening.  Testicular cancer screening is not recommended for adolescents or adult males who have no symptoms. Screening includes self-exam, a health care provider exam, and other screening tests. Consult with your health care provider about any symptoms you have or any concerns you have about testicular cancer.  Practice safe sex. Use condoms and avoid high-risk sexual practices to reduce the spread of  sexually transmitted infections (STIs).  You should be screened for STIs, including gonorrhea and chlamydia if:  You are sexually active and are younger than 24 years.  You are older than 24 years, and your health care provider tells you that you are at risk for this type of infection.  Your sexual activity has changed since you were last screened, and you are at an increased risk for chlamydia or gonorrhea. Ask your health care provider if you are at risk.  If you are at risk of being infected with HIV, it is recommended that you take a prescription medicine daily to prevent HIV infection. This is called pre-exposure prophylaxis (PrEP). You are considered at risk if:  You are a man who has sex with other men (MSM).  You are a heterosexual man who is sexually active with multiple partners.  You take drugs by injection.  You are sexually active with a partner who has HIV.  Talk with your health care provider about whether you are at high risk of being infected with  HIV. If you choose to begin PrEP, you should first be tested for HIV. You should then be tested every 3 months for as long as you are taking PrEP.  Use sunscreen. Apply sunscreen liberally and repeatedly throughout the day. You should seek shade when your shadow is shorter than you. Protect yourself by wearing long sleeves, pants, a wide-brimmed hat, and sunglasses year round whenever you are outdoors.  Tell your health care provider of new moles or changes in moles, especially if there is a change in shape or color. Also, tell your health care provider if a mole is larger than the size of a pencil eraser.  A one-time screening for abdominal aortic aneurysm (AAA) and surgical repair of large AAAs by ultrasound is recommended for men aged 73-75 years who are current or former smokers.  Stay current with your vaccines (immunizations).   This information is not intended to replace advice given to you by your health care provider. Make sure you discuss any questions you have with your health care provider.   Document Released: 04/28/2008 Document Revised: 11/21/2014 Document Reviewed: 03/28/2011 Elsevier Interactive Patient Education [employer].

## 2016-09-23 NOTE — Progress Notes (Signed)
Pre visit review using our clinic review tool, if applicable. No additional management support is needed unless otherwise documented below in the visit note. 

## 2016-11-15 ENCOUNTER — Other Ambulatory Visit: Payer: Managed Care, Other (non HMO)

## 2016-11-15 DIAGNOSIS — Z729 Problem related to lifestyle, unspecified: Secondary | ICD-10-CM

## 2016-11-16 LAB — HEPATITIS C ANTIBODY: HCV AB: NEGATIVE

## 2016-11-25 ENCOUNTER — Other Ambulatory Visit: Payer: Self-pay | Admitting: Internal Medicine

## 2016-12-18 ENCOUNTER — Other Ambulatory Visit: Payer: Self-pay | Admitting: Internal Medicine

## 2017-02-02 ENCOUNTER — Ambulatory Visit (INDEPENDENT_AMBULATORY_CARE_PROVIDER_SITE_OTHER): Payer: 59 | Admitting: Family Medicine

## 2017-02-02 ENCOUNTER — Encounter: Payer: Self-pay | Admitting: Family Medicine

## 2017-02-02 VITALS — BP 124/62 | HR 59 | Temp 98.0°F | Ht 68.5 in | Wt 209.0 lb

## 2017-02-02 DIAGNOSIS — B351 Tinea unguium: Secondary | ICD-10-CM | POA: Diagnosis not present

## 2017-02-02 NOTE — Progress Notes (Signed)
Pre visit review using our clinic review tool, if applicable. No additional management support is needed unless otherwise documented below in the visit note. 

## 2017-02-02 NOTE — Progress Notes (Signed)
Subjective:  Edward Goodwin is a 66 y.o. year old very pleasant male patient who presents for/with See problem oriented charting ROS- no fever or chills. No redness of the foot. No pain in foot   Past Medical History-  Patient Active Problem List   Diagnosis Date Noted  . Trochanteric bursitis of left hip 03/31/2015  . Gout 03/24/2014  . Elevated PSA, less than 10 ng/ml 09/09/2013  . Routine health maintenance 04/24/2012  . OBESITY, CLASS II 12/02/2008  . Essential hypertension 08/20/2007  . COLITIS, ULCERATIVE 08/20/2007  . PSORIASIS 08/20/2007    Medications- reviewed and updated Current Outpatient Prescriptions  Medication Sig Dispense Refill  . Ascorbic Acid (VITAMIN C PO) Take 1 tablet by mouth daily. 1000 mg    . clobetasol ointment (TEMOVATE) 0.05 % APPLY TO AFFECTED AREA TWICE A DAY 30 g 4  . lisinopril (PRINIVIL,ZESTRIL) 20 MG tablet TAKE 1 TABLET (20 MG TOTAL) BY MOUTH DAILY. 90 tablet 1  . Multiple Vitamins-Minerals (PRESERVISION AREDS 2) CAPS Take 1 capsule by mouth 2 (two) times daily before lunch and supper.     No current facility-administered medications for this visit.     Objective: BP 124/62 (BP Location: Left Arm, Patient Position: Sitting, Cuff Size: Large)   Pulse (!) 59   Temp 98 F (36.7 C) (Oral)   Ht 5' 8.5" (1.74 m)   Wt 209 lb (94.8 kg)   SpO2 95%   BMI 31.32 kg/m  Gen: NAD, resting comfortably CV: RRR (rate over 60 on exam) Lungs: nonlabored Ext: no edema, 2+ DP and PT pulses Skin: warm, dry Right 2nd great toenail thickened with yellow discoloration, great toe on right foot with yellowing in lateral 3rd of nail.  Neuro: grossly normal, moves all extremities, intact distal sensation  Assessment/Plan:  Onychomycosis - Plan: Culture, fungus without smear S: Years of issues 2nd toe right foot. States he injured this at least over 10 years ago and has remained thickened since that time and harder to trim.   Over last few months though has  noted right Great toe- lateral 3rd nail- yellowed. He is worried about fungus. Denies pain or discomfort A/P: I actually suspect that both toenails are affected by onychomycosis. We took a trimming from both toenails and will send for culture. Have had issues in the past with KOH not being completed and discussed with lab who stated preferred is the fungal culture. Patient willing to wait and wanted to do this as could potentially just be dystrophic nail and he is concerned about liver risks.    Orders Placed This Encounter  Procedures  . Culture, fungus without smear   Return precautions advised.  Garret Reddish, MD

## 2017-02-02 NOTE — Patient Instructions (Addendum)
We will send nail for fungal culture (may take up to a month to 6 weeks to get results). If positive for fungus- will  Reach out to you and plan to treat with terbinafine 268m for 12 weeks. Would need to check liver half way through this treatment at 6 weeks. Luckily your baseline liver tests are normal as below  Lab Results  Component Value Date   ALT 9 09/12/2016   AST 12 09/12/2016   ALKPHOS 62 09/12/2016   BILITOT 0.9 09/12/2016     Terbinafine tablets What is this medicine? TERBINAFINE (TER bin a feen) is an antifungal medicine. It is used to treat certain kinds of fungal or yeast infections. This medicine may be used for other purposes; ask your health care provider or pharmacist if you have questions. COMMON BRAND NAME(S): Lamisil, Terbinex What should I tell my health care provider before I take this medicine? They need to know if you have any of these conditions: -drink alcoholic beverages -kidney disease -liver disease -an unusual or allergic reaction to terbinafine, other medicines, foods, dyes, or preservatives -pregnant or trying to get pregnant -breast-feeding How should I use this medicine? Take this medicine by mouth with a full glass of water. Follow the directions on the prescription label. You can take this medicine with food or on an empty stomach. Take your medicine at regular intervals. Do not take your medicine more often than directed. Do not skip doses or stop your medicine early even if you feel better. Do not stop taking except on your doctor's advice. Talk to your pediatrician regarding the use of this medicine in children. Special care may be needed. Overdosage: If you think you have taken too much of this medicine contact a poison control center or emergency room at once. NOTE: This medicine is only for you. Do not share this medicine with others. What if I miss a dose? If you miss a dose, take it as soon as you can. If it is almost time for your next dose,  take only that dose. Do not take double or extra doses. What may interact with this medicine? Do not take this medicine with any of the following medications: -thioridazine This medicine may also interact with the following medications: -beta-blockers -caffeine -cimetidine -cyclosporine -medicines for depression, anxiety, or psychotic disturbances -medicines for fungal infections like fluconazole and ketoconazole -medicines for irregular heartbeat like amiodarone, flecainide and propafenone -rifampin -warfarin This list may not describe all possible interactions. Give your health care provider a list of all the medicines, herbs, non-prescription drugs, or dietary supplements you use. Also tell them if you smoke, drink alcohol, or use illegal drugs. Some items may interact with your medicine. What should I watch for while using this medicine? Visit your doctor or health care provider regularly. Tell your doctor right away if you have nausea or vomiting, loss of appetite, stomach pain on your right upper side, yellow skin, dark urine, light stools, or are over tired. Some fungal infections need many weeks or months of treatment to cure. If you are taking this medicine for a long time, you will need to have important blood work done. What side effects may I notice from receiving this medicine? Side effects that you should report to your doctor or health care professional as soon as possible: -allergic reactions like skin rash or hives, swelling of the face, lips, or tongue -changes in vision -dark urine -fever or infection -general ill feeling or flu-like symptoms -light-colored stools -loss  of appetite, nausea -redness, blistering, peeling or loosening of the skin, including inside the mouth -right upper belly pain -unusually weak or tired -yellowing of the eyes or skin Side effects that usually do not require medical attention (report to your doctor or health care professional if they  continue or are bothersome): -changes in taste -diarrhea -hair loss -muscle or joint pain -stomach gas -stomach upset This list may not describe all possible side effects. Call your doctor for medical advice about side effects. You may report side effects to FDA at 1-800-FDA-1088. Where should I keep my medicine? Keep out of the reach of children. Store at room temperature below 25 degrees C (77 degrees F). Protect from light. Throw away any unused medicine after the expiration date. NOTE: This sheet is a summary. It may not cover all possible information. If you have questions about this medicine, talk to your doctor, pharmacist, or health care provider.  2018 Elsevier/Gold Standard (2008-01-11 16:28:07)

## 2017-02-13 ENCOUNTER — Other Ambulatory Visit: Payer: Self-pay | Admitting: Family Medicine

## 2017-02-13 MED ORDER — TERBINAFINE HCL 250 MG PO TABS: 250.0000 mg | ORAL_TABLET | Freq: Every day | ORAL | 2 refills | Status: DC

## 2017-02-13 NOTE — Progress Notes (Signed)
lamisil

## 2017-02-14 ENCOUNTER — Other Ambulatory Visit: Payer: Self-pay

## 2017-02-14 DIAGNOSIS — Z79899 Other long term (current) drug therapy: Secondary | ICD-10-CM

## 2017-03-03 LAB — CULTURE, FUNGUS WITHOUT SMEAR

## 2017-04-03 DIAGNOSIS — D2262 Melanocytic nevi of left upper limb, including shoulder: Secondary | ICD-10-CM | POA: Diagnosis not present

## 2017-04-03 DIAGNOSIS — D1801 Hemangioma of skin and subcutaneous tissue: Secondary | ICD-10-CM | POA: Diagnosis not present

## 2017-04-03 DIAGNOSIS — L821 Other seborrheic keratosis: Secondary | ICD-10-CM | POA: Diagnosis not present

## 2017-04-03 DIAGNOSIS — D225 Melanocytic nevi of trunk: Secondary | ICD-10-CM | POA: Diagnosis not present

## 2017-04-03 DIAGNOSIS — L400 Psoriasis vulgaris: Secondary | ICD-10-CM | POA: Diagnosis not present

## 2017-04-06 ENCOUNTER — Other Ambulatory Visit (INDEPENDENT_AMBULATORY_CARE_PROVIDER_SITE_OTHER): Payer: PPO

## 2017-04-06 DIAGNOSIS — Z79899 Other long term (current) drug therapy: Secondary | ICD-10-CM

## 2017-04-06 LAB — HEPATIC FUNCTION PANEL
ALT: 9 U/L (ref 0–53)
AST: 13 U/L (ref 0–37)
Albumin: 4.5 g/dL (ref 3.5–5.2)
Alkaline Phosphatase: 62 U/L (ref 39–117)
BILIRUBIN DIRECT: 0.2 mg/dL (ref 0.0–0.3)
BILIRUBIN TOTAL: 0.9 mg/dL (ref 0.2–1.2)
Total Protein: 6.3 g/dL (ref 6.0–8.3)

## 2017-04-08 ENCOUNTER — Encounter (HOSPITAL_BASED_OUTPATIENT_CLINIC_OR_DEPARTMENT_OTHER): Payer: Self-pay | Admitting: Emergency Medicine

## 2017-04-08 ENCOUNTER — Emergency Department (HOSPITAL_BASED_OUTPATIENT_CLINIC_OR_DEPARTMENT_OTHER)
Admission: EM | Admit: 2017-04-08 | Discharge: 2017-04-08 | Disposition: A | Discharge status: Home / Self Care | Payer: PPO | Attending: Emergency Medicine | Admitting: Emergency Medicine

## 2017-04-08 DIAGNOSIS — Z87891 Personal history of nicotine dependence: Secondary | ICD-10-CM | POA: Insufficient documentation

## 2017-04-08 DIAGNOSIS — I10 Essential (primary) hypertension: Secondary | ICD-10-CM | POA: Diagnosis not present

## 2017-04-08 DIAGNOSIS — Z79899 Other long term (current) drug therapy: Secondary | ICD-10-CM | POA: Diagnosis not present

## 2017-04-08 DIAGNOSIS — R55 Syncope and collapse: Secondary | ICD-10-CM | POA: Diagnosis not present

## 2017-04-08 LAB — CBC WITH DIFFERENTIAL/PLATELET
BASOS PCT: 0 %
Basophils Absolute: 0 10*3/uL (ref 0.0–0.1)
EOS ABS: 0.1 10*3/uL (ref 0.0–0.7)
Eosinophils Relative: 2 %
HCT: 37.9 % — ABNORMAL LOW (ref 39.0–52.0)
Hemoglobin: 13.5 g/dL (ref 13.0–17.0)
Lymphocytes Relative: 17 %
Lymphs Abs: 0.9 10*3/uL (ref 0.7–4.0)
MCH: 30.3 pg (ref 26.0–34.0)
MCHC: 35.6 g/dL (ref 30.0–36.0)
MCV: 85 fL (ref 78.0–100.0)
MONO ABS: 0.7 10*3/uL (ref 0.1–1.0)
MONOS PCT: 13 %
Neutro Abs: 3.5 10*3/uL (ref 1.7–7.7)
Neutrophils Relative %: 68 %
Platelets: 120 10*3/uL — ABNORMAL LOW (ref 150–400)
RBC: 4.46 MIL/uL (ref 4.22–5.81)
RDW: 14 % (ref 11.5–15.5)
WBC: 5.2 10*3/uL (ref 4.0–10.5)

## 2017-04-08 LAB — BASIC METABOLIC PANEL
Anion gap: 7 (ref 5–15)
BUN: 17 mg/dL (ref 6–20)
CALCIUM: 8.7 mg/dL — AB (ref 8.9–10.3)
CO2: 26 mmol/L (ref 22–32)
CREATININE: 0.9 mg/dL (ref 0.61–1.24)
Chloride: 107 mmol/L (ref 101–111)
GFR calc non Af Amer: >60 mL/min (ref >60)
Glucose, Bld: 116 mg/dL — ABNORMAL HIGH (ref 65–99)
Potassium: 3.8 mmol/L (ref 3.5–5.1)
Sodium: 140 mmol/L (ref 135–145)

## 2017-04-08 MED ORDER — SODIUM CHLORIDE 0.9 % IV BOLUS (SEPSIS): 1000.0000 mL | Freq: Once | INTRAVENOUS | Status: DC

## 2017-04-08 MED ORDER — MECLIZINE HCL 25 MG PO TABS: 25.0000 mg | ORAL_TABLET | Freq: Three times a day (TID) | ORAL | 0 refills | Status: DC | PRN

## 2017-04-08 NOTE — ED Provider Notes (Signed)
[facility] Provider Note   CSN: [id number] Arrival date & time: 04/08/17  2725  By signing my name below, I, [name], attest that this documentation has been prepared under the direction and in the presence of Thurl Boen PA-C. Electronically Signed: [name], Scribe. 04/08/17. 7:34 PM.  History   Chief Complaint Chief Complaint  Patient presents with  . Near Syncope   The history is provided by the patient and the spouse. No language interpreter was used.   HPI Comments: Edward Goodwin is a 66 y.o. male who presents to the Emergency Department complaining of waxing/waning Lightheadedness that occurred this morning. He has associated dizziness and felt like the room was spinning. He states the symptoms occurred for a brief few seconds after waking up from a nap, but resolved. He decided to come to the ED after being advised by his PCP. He currently does not feel these symptoms. He denies changes to appetite, nausea, vomiting, diarrhea, abdominal pain, fever, CP, SOB, weakness, numbness, ear pain, changes to bladder function, recent injuries, head injuries, or vision changes. Pt walked into the ED today and felt normal. Pt takes lisinopril 43m daily for HTN and has been taking this for years but notes he did not take any today after his BP was 106/73 this morning, which is lower than his usual baseline.   Past Medical History:  Diagnosis Date  . [place]   . Hypertension   . Macular degeneration, bilateral 07/2012  . Psoriasis     Patient Active Problem List   Diagnosis Date Noted  . Trochanteric bursitis of left hip 03/31/2015  . Gout 03/24/2014  . Elevated PSA, less than 10 ng/ml 09/09/2013  . Routine health maintenance 04/24/2012  . OBESITY, CLASS II 12/02/2008  . Essential hypertension 08/20/2007  . COLITIS, ULCERATIVE 08/20/2007  . PSORIASIS 08/20/2007    Past Surgical History:  Procedure Laterality Date  . ACNE CYST REMOVAL    .  CATARACT EXTRACTION W/ INTRAOCULAR LENS IMPLANT  10/2014   bilat  . COLONOSCOPY    . FOOT SURGERY  1988  . SHOULDER ARTHROSCOPY  2007   Right  . TONSILLECTOMY         Home Medications    Prior to Admission medications   Medication Sig Start Date End Date Taking? Authorizing Provider  Ascorbic Acid (VITAMIN C PO) Take 1 tablet by mouth daily. 1000 mg    [provider]  clobetasol ointment (TEMOVATE) 0.05 % APPLY TO AFFECTED AREA TWICE A DAY 11/25/16   KMarletta Lor MD  lisinopril (PRINIVIL,ZESTRIL) 20 MG tablet TAKE 1 TABLET (20 MG TOTAL) BY MOUTH DAILY. 12/19/16   KMarletta Lor MD  meclizine (ANTIVERT) 25 MG tablet Take 1 tablet (25 mg total) by mouth 3 (three) times daily as needed for dizziness. 04/08/17   Almond Fitzgibbon, PA-C  Multiple Vitamins-Minerals (PRESERVISION AREDS 2) CAPS Take 1 capsule by mouth 2 (two) times daily before lunch and supper. 07/16/12   [provider]  terbinafine (LAMISIL) 250 MG tablet Take 1 tablet (250 mg total) by mouth daily. 02/13/17   HMarin Olp MD    Family History Family History  Problem Relation Age of Onset  . Cancer Mother        Breast  . Heart disease Mother   . Heart disease Father        CAD/MI  . Diabetes Daughter   . Diabetes Maternal Uncle   . Diabetes Paternal Aunt   .  Colon cancer Neg Hx   . Esophageal cancer Neg Hx   . Stomach cancer Neg Hx   . Rectal cancer Neg Hx     Social History Social History  Substance Use Topics  . Smoking status: Former Smoker    Quit date: 10/23/1989  . Smokeless tobacco: Never Used  . Alcohol use 0.0 oz/week     Comment: rare glass of wine - less than once a year     Allergies   Sulfonamide derivatives   Review of Systems Review of Systems  Constitutional: Negative for appetite change and fever.  Eyes: Negative for photophobia, pain and visual disturbance.  Respiratory: Negative for shortness of breath.   Cardiovascular: Negative for chest pain.    Gastrointestinal: Negative for abdominal pain, diarrhea, nausea and vomiting.  Genitourinary: Negative for decreased urine volume, difficulty urinating, dysuria, frequency, hematuria and urgency.  Neurological: Positive for syncope (near syncope) and light-headedness. Negative for weakness and numbness.     Physical Exam Updated Vital Signs BP 130/79 (BP Location: Right Arm)   Pulse 63   Temp 98.4 F (36.9 C) (Oral)   Resp 18   Ht 5' 9"  (1.753 m)   Wt 91.6 kg (202 lb)   SpO2 99%   BMI 29.83 kg/m   Physical Exam  Constitutional: He appears well-developed and well-nourished. No distress.  HENT:  Head: Normocephalic and atraumatic.  Right Ear: Tympanic membrane normal.  Left Ear: Tympanic membrane normal.  Nose: Nose normal.  Eyes: Conjunctivae and EOM are normal. Pupils are equal, round, and reactive to light. Left eye exhibits no discharge. No scleral icterus. Right eye exhibits no nystagmus. Left eye exhibits no nystagmus.  Neck: Normal range of motion. Neck supple.  Cardiovascular: Normal rate, regular rhythm, normal heart sounds and intact distal pulses.  Exam reveals no gallop and no friction rub.   No murmur heard. Pulmonary/Chest: Effort normal and breath sounds normal. No respiratory distress.  Abdominal: Soft. Bowel sounds are normal. He exhibits no distension. There is no tenderness. There is no guarding.  Musculoskeletal: Normal range of motion. He exhibits no edema.  Neurological: He is alert. He exhibits normal muscle tone. Coordination normal.  Skin: Skin is warm and dry. No rash noted. He is not diaphoretic.  Psychiatric: He has a normal mood and affect.  Nursing note and vitals reviewed.    ED Treatments / Results  DIAGNOSTIC STUDIES: Oxygen Saturation is 99% on RA, normal by my interpretation.    COORDINATION OF CARE: 7:25 PM Discussed treatment plan with pt at bedside and pt agreed to plan.  Labs (all labs ordered are listed, but only abnormal results  are displayed) Labs Reviewed  BASIC METABOLIC PANEL - Abnormal; Notable for the following:       Result Value   Glucose, Bld 116 (*)    Calcium 8.7 (*)    All other components within normal limits  CBC WITH DIFFERENTIAL/PLATELET - Abnormal; Notable for the following:    HCT 37.9 (*)    Platelets 120 (*)    All other components within normal limits    EKG  EKG Interpretation  Date/Time:  Saturday Apr 08 2017 18:47:02 EDT Ventricular Rate:  57 PR Interval:  166 QRS Duration: 90 QT Interval:  402 QTC Calculation: 391 R Axis:   38 Text Interpretation:  Sinus bradycardia Cannot rule out Anterior infarct , age undetermined No previous tracing Confirmed by Blanchie Dessert [id number]) on 04/08/2017 6:49:23 PM       Radiology No  results found.  Procedures Procedures (including critical care time)  Medications Ordered in ED Medications - No data to display   Initial Impression / Assessment and Plan / ED Course  I have reviewed the triage vital signs and the nursing notes.  Pertinent labs & imaging results that were available during my care of the patient were reviewed by me and considered in my medical decision making (see chart for details).     Patient's history and symptoms concerning for vertigo versus dehydration. Patient is not complaining of any vertiginous symptoms, pain, blurry vision or gait changes. He has no nystagmus on exam. CBC and BMP unremarkable today. EKG showed no acute changes. No chest pain or respiratory distress noted. Neurological exam revealed no focal deficits, no concern for CVA. Patient appears stable for discharge at this time. No need for any further workup. We'll give meclizine to be taken as needed and advised him to follow-up with his PCP.  Patient discussed with and seen by Dr. Maryan Rued.  Final Clinical Impressions(s) / ED Diagnoses   Final diagnoses:  Near syncope    New Prescriptions New Prescriptions   MECLIZINE (ANTIVERT) 25 MG  TABLET    Take 1 tablet (25 mg total) by mouth 3 (three) times daily as needed for dizziness.   I personally performed the services described in this documentation, which was scribed in my presence. The recorded information has been reviewed and is accurate.]    Delia Heady, PA-C 04/08/17 2043    Blanchie Dessert, MD 04/08/17 [phone]

## 2017-04-08 NOTE — ED Triage Notes (Signed)
Patient states that he has had intermittent dizziness today. Reports that it started this am when he bent over. The patient reports that he took a nap and turned over and the whole room was spinning

## 2017-04-08 NOTE — ED Notes (Signed)
ED Provider at bedside. 

## 2017-04-08 NOTE — Discharge Instructions (Signed)
Take meclizine as needed for dizziness. Maintain adequate hydration. Continue home medications as previous scribe. Schedule appointment with PCP for further evaluation. Return to ED for chest pain, trouble breathing, loss of consciousness, numbness, weakness, inability to walk.

## 2017-05-29 ENCOUNTER — Encounter: Payer: Self-pay | Admitting: Internal Medicine

## 2017-05-29 ENCOUNTER — Ambulatory Visit (INDEPENDENT_AMBULATORY_CARE_PROVIDER_SITE_OTHER): Payer: PPO | Admitting: Internal Medicine

## 2017-05-29 VITALS — BP 138/72 | HR 56 | Temp 97.9°F | Ht 69.0 in | Wt 210.0 lb

## 2017-05-29 DIAGNOSIS — I10 Essential (primary) hypertension: Secondary | ICD-10-CM

## 2017-05-29 DIAGNOSIS — B351 Tinea unguium: Secondary | ICD-10-CM

## 2017-05-29 NOTE — Progress Notes (Signed)
Subjective:    Patient ID: Markevious Ehmke, male    DOB: [birth year], 66 y.o.   MRN: [id number]  HPI  66 year old patient who has essential hypertension. He was treated for toenail onychomycosis for 12 weeks, which was completed about one month ago He has persistent changes involving the  right great toenail and right second toenail. The right second toenail has been discolored for decades following remote trauma.  Past Medical History:  Diagnosis Date  . Colitis, ulcerative ([place])   . Hypertension   . Macular degeneration, bilateral 07/2012  . Psoriasis      Social History   Social History  . Marital status: Married    Spouse name: N/A  . Number of children: 1  . Years of education: 82   Occupational History  . insurance sales    Social History Main Topics  . Smoking status: Former Smoker    Quit date: 10/23/1989  . Smokeless tobacco: Never Used  . Alcohol use 0.0 oz/week     Comment: rare glass of wine - less than once a year  . Drug use: No  . Sexual activity: Not Currently   Other Topics Concern  . 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 [employer] 6 years. Married 1971   I daughter 37; 1 grandchild 2008 - daughter divorced 2010. 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 Yes for acute CPR, no for prolonged mechanical ventilatory support, no for prolonged artificial nutrition or other heroic measures leaving him in an incapacitated state.     Past Surgical History:  Procedure Laterality Date  . ACNE CYST REMOVAL    . CATARACT EXTRACTION W/ INTRAOCULAR LENS IMPLANT  10/2014   bilat  . COLONOSCOPY    . FOOT SURGERY  1988  . SHOULDER ARTHROSCOPY  2007   Right  . TONSILLECTOMY      Family History  Problem Relation Age of Onset  . Cancer Mother        Breast  . Heart disease Mother   . Heart disease Father        CAD/MI  . Diabetes Daughter   . Diabetes Maternal Uncle   . Diabetes Paternal  Aunt   . Colon cancer Neg Hx   . Esophageal cancer Neg Hx   . Stomach cancer Neg Hx   . Rectal cancer Neg Hx     Allergies  Allergen Reactions  . Sulfonamide Derivatives     "have just been told that my entire life"    Current Outpatient Prescriptions on File Prior to Visit  Medication Sig Dispense Refill  . Ascorbic Acid (VITAMIN C PO) Take 1 tablet by mouth daily. 1000 mg    . lisinopril (PRINIVIL,ZESTRIL) 20 MG tablet TAKE 1 TABLET (20 MG TOTAL) BY MOUTH DAILY. 90 tablet 1  . meclizine (ANTIVERT) 25 MG tablet Take 1 tablet (25 mg total) by mouth 3 (three) times daily as needed for dizziness. 30 tablet 0  . Multiple Vitamins-Minerals (PRESERVISION AREDS 2) CAPS Take 1 capsule by mouth 2 (two) times daily before lunch and supper.     No current facility-administered medications on file prior to visit.     BP 138/72 (BP Location: Left Arm, Patient Position: Sitting, Cuff Size: Normal)   Pulse (!) 56   Temp 97.9 F (36.6 C) (Oral)   Ht 5' 9"  (1.753 m)   Wt 210 lb (95.3 kg)   SpO2 97%  BMI 31.01 kg/m     Review of Systems  Skin: Positive for rash and wound.       Objective:   Physical Exam  Constitutional: He appears well-developed and well-nourished. No distress.  Blood pressure 130/70  Skin:  Discoloration and slight nail thickening involving the right first and second toenails          Assessment & Plan:   Toenail onychomycosis.  Patient completed treatment about one month ago.  He was made aware that the nail changes might persist for several months and may not have normal-appearing nails until this time next year. Suspect the patient will have nice clinical response involving the right great toenail, but not as optimistic for the second toenail which has been discolored since remote trauma  Edward Goodwin

## 2017-05-29 NOTE — Patient Instructions (Addendum)

## 2017-06-10 ENCOUNTER — Other Ambulatory Visit: Payer: Self-pay | Admitting: Internal Medicine

## 2017-08-03 ENCOUNTER — Encounter: Payer: Self-pay | Admitting: Internal Medicine

## 2017-10-19 ENCOUNTER — Encounter: Payer: Self-pay | Admitting: Internal Medicine

## 2017-10-19 ENCOUNTER — Ambulatory Visit (INDEPENDENT_AMBULATORY_CARE_PROVIDER_SITE_OTHER): Payer: PPO | Admitting: Internal Medicine

## 2017-10-19 VITALS — BP 130/68 | HR 64 | Temp 97.8°F | Ht 69.0 in | Wt 208.0 lb

## 2017-10-19 DIAGNOSIS — M1000 Idiopathic gout, unspecified site: Secondary | ICD-10-CM | POA: Diagnosis not present

## 2017-10-19 DIAGNOSIS — Z0000 Encounter for general adult medical examination without abnormal findings: Secondary | ICD-10-CM

## 2017-10-19 DIAGNOSIS — K5100 Ulcerative (chronic) pancolitis without complications: Secondary | ICD-10-CM

## 2017-10-19 DIAGNOSIS — R972 Elevated prostate specific antigen [PSA]: Secondary | ICD-10-CM

## 2017-10-19 DIAGNOSIS — I10 Essential (primary) hypertension: Secondary | ICD-10-CM | POA: Diagnosis not present

## 2017-10-19 LAB — LIPID PANEL
CHOLESTEROL: 161 mg/dL (ref 0–200)
HDL: 43 mg/dL (ref >39.00)
LDL Cholesterol: 101 mg/dL — ABNORMAL HIGH (ref 0–99)
NONHDL: 118.06
Total CHOL/HDL Ratio: 4
Triglycerides: 83 mg/dL (ref 0.0–149.0)
VLDL: 16.6 mg/dL (ref 0.0–40.0)

## 2017-10-19 LAB — CBC WITH DIFFERENTIAL/PLATELET
BASOS ABS: 0 10*3/uL (ref 0.0–0.1)
Basophils Relative: 0.3 % (ref 0.0–3.0)
Eosinophils Absolute: 0 10*3/uL (ref 0.0–0.7)
Eosinophils Relative: 0.9 % (ref 0.0–5.0)
HCT: 44.3 % (ref 39.0–52.0)
Hemoglobin: 15.1 g/dL (ref 13.0–17.0)
LYMPHS ABS: 1 10*3/uL (ref 0.7–4.0)
Lymphocytes Relative: 18 % (ref 12.0–46.0)
MCHC: 34.1 g/dL (ref 30.0–36.0)
MCV: 86.7 fl (ref 78.0–100.0)
MONO ABS: 0.6 10*3/uL (ref 0.1–1.0)
MONOS PCT: 10.5 % (ref 3.0–12.0)
NEUTROS ABS: 3.8 10*3/uL (ref 1.4–7.7)
NEUTROS PCT: 70.3 % (ref 43.0–77.0)
PLATELETS: 143 10*3/uL — AB (ref 150.0–400.0)
RBC: 5.11 Mil/uL (ref 4.22–5.81)
RDW: 13.9 % (ref 11.5–15.5)
WBC: 5.4 10*3/uL (ref 4.0–10.5)

## 2017-10-19 LAB — COMPREHENSIVE METABOLIC PANEL
ALBUMIN: 4.7 g/dL (ref 3.5–5.2)
ALK PHOS: 65 U/L (ref 39–117)
ALT: 11 U/L (ref 0–53)
AST: 15 U/L (ref 0–37)
BUN: 16 mg/dL (ref 6–23)
CO2: 32 mEq/L (ref 19–32)
CREATININE: 0.88 mg/dL (ref 0.40–1.50)
Calcium: 9 mg/dL (ref 8.4–10.5)
Chloride: 102 mEq/L (ref 96–112)
GFR: 91.41 mL/min (ref >60.00)
GLUCOSE: 86 mg/dL (ref 70–99)
POTASSIUM: 4.1 meq/L (ref 3.5–5.1)
SODIUM: 141 meq/L (ref 135–145)
TOTAL PROTEIN: 6.5 g/dL (ref 6.0–8.3)
Total Bilirubin: 1 mg/dL (ref 0.2–1.2)

## 2017-10-19 LAB — PSA: PSA: 2.49 ng/mL (ref 0.10–4.00)

## 2017-10-19 LAB — TSH: TSH: 2.08 u[IU]/mL (ref 0.35–4.50)

## 2017-10-19 NOTE — Patient Instructions (Signed)
Limit your sodium (Salt) intake  Please check your blood pressure on a regular basis.  If it is consistently greater than 150/90, please make an office appointment.    It is important that you exercise regularly, at least 20 minutes 3 to 4 times per week.  If you develop chest pain or shortness of breath seek  medical attention.  Return in one year for follow-up

## 2017-10-19 NOTE — Progress Notes (Signed)
Subjective:    Patient ID: Edward Goodwin, male    DOB: [birth year], 66 y.o.   MRN: [id number]  HPI  66 year old patient who is seen today for a comprehensive annual exam and a subsequent Medicare wellness visit. He enjoys excellent health.  He does have a history of essential hypertension.  He has remote history of ulcerative colitis.  He did have colonoscopy in 2016. No real concerns or complaints. He has history of gout but has been stable.  Family history Father died at 75.  History of early coronary artery disease in his 56s.  History of hypertension Mother died age 21 history of breast cancer and cerebrovascular disease One brother with a history of hepatitis C and died of septic complications  Past Medical History:  Diagnosis Date  . Colitis, ulcerative ([place])   . Hypertension   . Macular degeneration, bilateral 07/2012  . Psoriasis      Social History   Socioeconomic History  . Marital status: Married    Spouse name: Not on file  . Number of children: 1  . Years of education: 24  . Highest education level: Not on file  Social Needs  . Financial resource strain: Not on file  . Food insecurity - worry: Not on file  . Food insecurity - inability: Not on file  . Transportation needs - medical: Not on file  . Transportation needs - non-medical: Not on file  Occupational History  . Occupation: Herbalist  Tobacco Use  . Smoking status: Former Smoker    Last attempt to quit: 10/23/1989    Years since quitting: 28.0  . Smokeless tobacco: Never Used  Substance and Sexual Activity  . Alcohol use: Yes    Alcohol/week: 0.0 oz    Comment: rare glass of wine - less than once a year  . Drug use: No  . Sexual activity: Not Currently  Other Topics Concern  . Not on file  Social History Writer. 2 years college. [employer] 6 years. Married 1971   I daughter 30; 1 grandchild 2008 - daughter divorced 2010. Work: Fish farm manager. ACP: HCPOA - wife. Yes for acute CPR, no for prolonged mechanical ventilatory support, no for prolonged artificial nutrition or other heroic measures leaving him in an incapacitated state.     Past Surgical History:  Procedure Laterality Date  . ACNE CYST REMOVAL    . CATARACT EXTRACTION W/ INTRAOCULAR LENS IMPLANT  10/2014   bilat  . COLONOSCOPY    . FOOT SURGERY  1988  . SHOULDER ARTHROSCOPY  2007   Right  . TONSILLECTOMY      Family History  Problem Relation Age of Onset  . Cancer Mother        Breast  . Heart disease Mother   . Heart disease Father        CAD/MI  . Diabetes Daughter   . Diabetes Maternal Uncle   . Diabetes Paternal Aunt   . Colon cancer Neg Hx   . Esophageal cancer Neg Hx   . Stomach cancer Neg Hx   . Rectal cancer Neg Hx     Allergies  Allergen Reactions  . Sulfonamide Derivatives     "have just been told that my entire life"    Current Outpatient Medications on File Prior to Visit  Medication Sig Dispense Refill  . Ascorbic Acid (VITAMIN C PO) Take 1 tablet by mouth daily. 1000 mg    .  L-Lysine 1000 MG TABS Take 1,000 mg by mouth daily.    Marland Kitchen lisinopril (PRINIVIL,ZESTRIL) 20 MG tablet TAKE 1 TABLET (20 MG TOTAL) BY MOUTH DAILY. 90 tablet 1  . meclizine (ANTIVERT) 25 MG tablet Take 1 tablet (25 mg total) by mouth 3 (three) times daily as needed for dizziness. 30 tablet 0  . Multiple Vitamins-Minerals (PRESERVISION AREDS 2) CAPS Take 1 capsule by mouth 2 (two) times daily before lunch and supper.     No current facility-administered medications on file prior to visit.     BP 130/68 (BP Location: Left Arm, Patient Position: Sitting, Cuff Size: Normal)   Pulse 64   Temp 97.8 F (36.6 C) (Oral)   Ht 5' 9"  (1.753 m)   Wt 208 lb (94.3 kg)   SpO2 98%   BMI 30.72 kg/m   Subsequent Medicare wellness visit  1. Risk factors, based on past  M,S,F history.  Cardiovascular risk factors include a history of hypertension only 2.  Physical  activities: Remains quite active without exercise limitations.  Is active with golf.  No regular exercise regimen  3.  Depression/mood: No history of major depression or mood disorder  4.  Hearing: History of a hearing loss.  Wears bilateral hearing aids   5.  ADL's: Independent  6.  Fall risk: Low  7.  Home safety: No problems identified  8.  Height weight, and visual acuity; height and weight stable no change in visual activity  9.  Counseling: Continue heart healthy diet more regular exercise activities recommended  10. Lab orders based on risk factors: Laboratory update reviewed  11. Referral : None appropriate at this time  12. Care plan: Continue efforts at aggressive risk factor modification  13. Cognitive assessment: Alert and oriented with normal affect no cognitive dysfunction  14. Screening: Patient provided with a written and personalized 5-10 year screening schedule in the AVS.    15. Provider List Update: Dental medicine ophthalmology primary care and dermatology   Review of Systems  Constitutional: Negative for appetite change, chills, fatigue and fever.  HENT: Negative for congestion, dental problem, ear pain, hearing loss, sore throat, tinnitus, trouble swallowing and voice change.   Eyes: Negative for pain, discharge and visual disturbance.  Respiratory: Negative for cough, chest tightness, wheezing and stridor.   Cardiovascular: Negative for chest pain, palpitations and leg swelling.  Gastrointestinal: Negative for abdominal distention, abdominal pain, blood in stool, constipation, diarrhea, nausea and vomiting.  Genitourinary: Negative for difficulty urinating, discharge, flank pain, genital sores, hematuria and urgency.  Musculoskeletal: Negative for arthralgias, back pain, gait problem, joint swelling, myalgias and neck stiffness.  Skin: Negative for rash.  Neurological: Negative for dizziness, syncope, speech difficulty, weakness, numbness and headaches.    Hematological: Negative for adenopathy. Does not bruise/bleed easily.  Psychiatric/Behavioral: Negative for behavioral problems and dysphoric mood. The patient is not nervous/anxious.        Objective:   Physical Exam  Constitutional: He appears well-developed and well-nourished.  Blood pressure well controlled  HENT:  Head: Normocephalic and atraumatic.  Right Ear: External ear normal.  Left Ear: External ear normal.  Nose: Nose normal.  Mouth/Throat: Oropharynx is clear and moist.  Bilateral hearing aids  Eyes: Conjunctivae and EOM are normal. Pupils are equal, round, and reactive to light. No scleral icterus.  Neck: Normal range of motion. Neck supple. No JVD present. No thyromegaly present.  Cardiovascular: Regular rhythm, normal heart sounds and intact distal pulses. Exam reveals no gallop and no friction  rub.  No murmur heard. Decreased left dorsalis pedis pulse  Pulmonary/Chest: Effort normal and breath sounds normal. He exhibits no tenderness.  Abdominal: Soft. Bowel sounds are normal. He exhibits no distension and no mass. There is no tenderness.  Genitourinary: Prostate normal and penis normal.  Musculoskeletal: Normal range of motion. He exhibits no edema or tenderness.  Lymphadenopathy:    He has no cervical adenopathy.  Neurological: He is alert. He has normal reflexes. No cranial nerve deficit. Coordination normal.  Skin: Skin is warm and dry. No rash noted.  Psychiatric: He has a normal mood and affect. His behavior is normal.          Assessment & Plan:

## 2017-11-23 DIAGNOSIS — H353131 Nonexudative age-related macular degeneration, bilateral, early dry stage: Secondary | ICD-10-CM | POA: Diagnosis not present

## 2017-12-04 ENCOUNTER — Other Ambulatory Visit: Payer: Self-pay | Admitting: Internal Medicine

## 2017-12-19 ENCOUNTER — Ambulatory Visit (INDEPENDENT_AMBULATORY_CARE_PROVIDER_SITE_OTHER): Payer: PPO | Admitting: Family Medicine

## 2017-12-19 ENCOUNTER — Encounter: Payer: Self-pay | Admitting: Family Medicine

## 2017-12-19 VITALS — BP 118/76 | HR 58 | Temp 97.9°F | Wt 211.0 lb

## 2017-12-19 DIAGNOSIS — B351 Tinea unguium: Secondary | ICD-10-CM | POA: Diagnosis not present

## 2017-12-19 DIAGNOSIS — R42 Dizziness and giddiness: Secondary | ICD-10-CM

## 2017-12-19 MED ORDER — TERBINAFINE HCL 250 MG PO TABS: 250.0000 mg | ORAL_TABLET | Freq: Every day | ORAL | 0 refills | Status: DC

## 2017-12-19 MED ORDER — MECLIZINE HCL 25 MG PO TABS: 25.0000 mg | ORAL_TABLET | Freq: Four times a day (QID) | ORAL | 2 refills | Status: DC | PRN

## 2017-12-19 NOTE — Progress Notes (Signed)
   Subjective:    Patient ID: Edward Goodwin, male    DOB: 01-29-1951, 67 y.o.   MRN: [id number]  HPI Here for 2 things. First he has had several days of intermittent vertigo where the room seems to spin around him. No nausea or headaches. This is most prevalent when he rolls over in bed. He has used Meclizine successfully in the past but has run out. Also he used Terbinafine for 3 months last year for toenail fungus and it helped, but it seems to be coming back.    Review of Systems  Constitutional: Negative.   HENT: Positive for congestion. Negative for ear pain, postnasal drip, sinus pressure, sinus pain and tinnitus.   Eyes: Negative.   Respiratory: Negative.   Cardiovascular: Negative.   Neurological: Positive for dizziness. Negative for light-headedness and headaches.       Objective:   Physical Exam  Constitutional: He is oriented to person, place, and time. He appears well-developed and well-nourished.  HENT:  Right Ear: External ear normal.  Left Ear: External ear normal.  Nose: Nose normal.  Mouth/Throat: Oropharynx is clear and moist.  Eyes: Conjunctivae are normal.  Neck: No thyromegaly present.  Cardiovascular: Normal rate, regular rhythm, normal heart sounds and intact distal pulses.  Pulmonary/Chest: Effort normal and breath sounds normal. No respiratory distress. He has no wheezes. He has no rales.  Lymphadenopathy:    He has no cervical adenopathy.  Neurological: He is alert and oriented to person, place, and time. No cranial nerve deficit. Coordination normal.  Skin:  7 out of 10 toenails show fungal involvement           Assessment & Plan:  For the vertigo, he will take a Claritin every day for 2 weeks and add Meclizine as needed. For the onychomycosis take Terbinafine daily for 90 days. Alysia Penna, MD

## 2018-03-22 ENCOUNTER — Telehealth: Payer: Self-pay | Admitting: Internal Medicine

## 2018-03-22 NOTE — Telephone Encounter (Signed)
Copied from [place] [id number]. Topic: Quick Communication - See Telephone Encounter >> Mar 22, 2018  3:18 PM Cleaster Corin, NT wrote: CRM for notification. See Telephone encounter for: 03/22/18.  Pt. And wife Calling to become new pt. Of Dr. Yong Channel due to Dr. Raliegh Ip leaving [facility]

## 2018-03-22 NOTE — Telephone Encounter (Signed)
I am willing to accept- I have cared for patient before. I will accept wife as well.    Lea- I  likely will need to place a limit on # of medicare patients I am able to accept from Dr. Burnice Logan- Jacklynn Ganong has providers that do not have full patient panels and should be able to see many of his patients. This is probably something we need to all sit down and discuss as a group.

## 2018-03-23 NOTE — Telephone Encounter (Signed)
Called patient and got him and his wife scheduled for their New Patient Appointment for June 6th.

## 2018-03-23 NOTE — Telephone Encounter (Signed)
Noted  

## 2018-04-05 DIAGNOSIS — L400 Psoriasis vulgaris: Secondary | ICD-10-CM | POA: Diagnosis not present

## 2018-04-05 DIAGNOSIS — L570 Actinic keratosis: Secondary | ICD-10-CM | POA: Diagnosis not present

## 2018-04-05 DIAGNOSIS — D1801 Hemangioma of skin and subcutaneous tissue: Secondary | ICD-10-CM | POA: Diagnosis not present

## 2018-04-05 DIAGNOSIS — L821 Other seborrheic keratosis: Secondary | ICD-10-CM | POA: Diagnosis not present

## 2018-04-05 DIAGNOSIS — L814 Other melanin hyperpigmentation: Secondary | ICD-10-CM | POA: Diagnosis not present

## 2018-04-19 ENCOUNTER — Ambulatory Visit (INDEPENDENT_AMBULATORY_CARE_PROVIDER_SITE_OTHER): Payer: PPO | Admitting: Family Medicine

## 2018-04-19 ENCOUNTER — Encounter: Payer: Self-pay | Admitting: Family Medicine

## 2018-04-19 VITALS — BP 126/82 | HR 55 | Temp 98.3°F | Ht 69.0 in | Wt 213.0 lb

## 2018-04-19 DIAGNOSIS — L408 Other psoriasis: Secondary | ICD-10-CM

## 2018-04-19 DIAGNOSIS — M1000 Idiopathic gout, unspecified site: Secondary | ICD-10-CM

## 2018-04-19 DIAGNOSIS — K5100 Ulcerative (chronic) pancolitis without complications: Secondary | ICD-10-CM

## 2018-04-19 DIAGNOSIS — Z87891 Personal history of nicotine dependence: Secondary | ICD-10-CM | POA: Diagnosis not present

## 2018-04-19 DIAGNOSIS — I10 Essential (primary) hypertension: Secondary | ICD-10-CM

## 2018-04-19 DIAGNOSIS — E785 Hyperlipidemia, unspecified: Secondary | ICD-10-CM | POA: Diagnosis not present

## 2018-04-19 DIAGNOSIS — R42 Dizziness and giddiness: Secondary | ICD-10-CM | POA: Diagnosis not present

## 2018-04-19 NOTE — Assessment & Plan Note (Signed)
2 rounds of treatment- may have some dystrophic changes as root cause

## 2018-04-19 NOTE — Assessment & Plan Note (Signed)
April 2015 initial episode. Had one flare in last 2 years. Got better with stopping diuretic

## 2018-04-19 NOTE — Assessment & Plan Note (Signed)
S: remote history. He wonders about misdiagnosis even. Has had colonoscopy with Dr. Carlean Purl within last few years and was told 10 year follow up.  A/P: will continue to monitor. No blood in stool, no abdominal pain

## 2018-04-19 NOTE — Assessment & Plan Note (Signed)
BPV most likely- Meclizine on hand. Had ER trip to [facility] at one point. Has had flare ups

## 2018-04-19 NOTE — Assessment & Plan Note (Signed)
OTC ointment- works as well as clobetasol he has been on in past. Sees Dr. Elvera Lennox- but mainly just for skin checks.

## 2018-04-19 NOTE — Assessment & Plan Note (Signed)
S: dad with MI in 34s . Patients LDL at 101 but other #s look pretty good.   A/P: 10-year ASCVD risk 16.9%.  He would prefer to not be on a statin-   I told him I was concerned given his father's history.  We discussed coronary CT option- would have out-of-pocket cost of $150- patient would like to proceed forward with coronary CT to better risk stratify  And aid in decision on statin

## 2018-04-19 NOTE — Assessment & Plan Note (Signed)
S: controlled on lisinopril 47m BP Readings from Last 3 Encounters:  04/19/18 126/82  12/19/17 118/76  10/19/17 130/68  A/P: We discussed blood pressure goal of <140/90. Continue current meds

## 2018-04-19 NOTE — Progress Notes (Signed)
Phone: [phone]  Subjective:  Patient presents today to establish care with me as their new primary care provider. Patient was formerly a patient of Dr. Burnice Logan. Chief complaint-noted.   See problem oriented charting ROS- Thickened discolored toenails still.  Denies blood in stool or abdominal pain. No chest pain or shortness of breath. No headache or increased edema.  The following were reviewed and entered/updated in epic: Past Medical History:  Diagnosis Date  . Chicken pox   . Colitis, ulcerative ([place])   . Hypertension   . Macular degeneration, bilateral 07/2012   areds 2   . Psoriasis    Patient Active Problem List   Diagnosis Date Noted  . Ulcerative colitis ([place]) 08/20/2007    Priority: High  . Former smoker 04/19/2018    Priority: Medium  . Hyperlipidemia 04/19/2018    Priority: Medium  . Vertigo 12/19/2017    Priority: Medium  . Gout 03/24/2014    Priority: Medium  . Elevated PSA, less than 10 ng/ml 09/09/2013    Priority: Medium  . Macular degeneration, bilateral 07/15/2012    Priority: Medium  . Obesity (BMI 30.0-34.9) 12/02/2008    Priority: Medium  . Essential hypertension 08/20/2007    Priority: Medium  . PSORIASIS 08/20/2007    Priority: Medium  . Onychomycosis 12/19/2017    Priority: Low  . Trochanteric bursitis of left hip 03/31/2015    Priority: Low   Past Surgical History:  Procedure Laterality Date  . ACNE CYST REMOVAL    . CATARACT EXTRACTION W/ INTRAOCULAR LENS IMPLANT  10/2014   bilat  . COLONOSCOPY    . FOOT SURGERY  1988  . SHOULDER ARTHROSCOPY  2007   Right  . TONSILLECTOMY      Family History  Problem Relation Age of Onset  . Heart disease Mother   . Birth defects Mother   . Breast cancer Mother   . Hypertension Mother   . Stroke Mother   . Heart disease Father        CAD/MI 22s or 78s smoker. led to death age 44.   Marland Kitchen Hypertension Father   . Diabetes Daughter   . Diabetes Maternal Uncle   . Diabetes Paternal  Aunt   . Stroke Maternal Grandmother   . Arthritis Paternal Grandmother   . Colon cancer Neg Hx   . Esophageal cancer Neg Hx   . Stomach cancer Neg Hx   . Rectal cancer Neg Hx     Medications- reviewed and updated Current Outpatient Medications  Medication Sig Dispense Refill  . Ascorbic Acid (VITAMIN C PO) Take 1 tablet by mouth daily. 1000 mg    . L-Lysine 1000 MG TABS Take 500 mg by mouth daily.     Marland Kitchen lisinopril (PRINIVIL,ZESTRIL) 20 MG tablet TAKE 1 TABLET BY MOUTH EVERY DAY 90 tablet 1  . meclizine (ANTIVERT) 25 MG tablet Take 1 tablet (25 mg total) by mouth every 6 (six) hours as needed for dizziness. 60 tablet 2  . Multiple Vitamins-Minerals (PRESERVISION AREDS 2) CAPS Take 1 capsule by mouth 2 (two) times daily before lunch and supper.    . terbinafine (LAMISIL) 250 MG tablet Take 1 tablet (250 mg total) by mouth daily. 90 tablet 0   No current facility-administered medications for this visit.     Allergies-reviewed and updated Allergies  Allergen Reactions  . Sulfonamide Derivatives     "have just been told that my entire life"    Social History   Social History Narrative  Married 1971. 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 [employer] 6 years.       ACP: HCPOA - wife. Yes for acute CPR, no for prolonged mechanical ventilatory support, no for prolonged artificial nutrition or other heroic measures leaving him in an incapacitated state.     Objective: BP 126/82 (BP Location: Left Arm, Patient Position: Sitting, Cuff Size: Normal)   Pulse (!) 55   Temp 98.3 F (36.8 C) (Oral)   Ht 5' 9"  (1.753 m)   Wt 213 lb (96.6 kg)   SpO2 96%   BMI 31.45 kg/m  Gen: NAD, resting comfortably HEENT: Mucous membranes are moist. Oropharynx normal. Hearing aids in place CV: RRR- low normal HR on my exam- no murmurs rubs or gallops Lungs: CTAB no crackles,  wheeze, rhonchi Abdomen: soft/nontender/nondistended/normal bowel sounds.  Ext: no edema Skin: warm, dry, slight erythema R cheek Neuro: grossly normal, moves all extremities, PERRLA  Assessment/Plan:  Other notes: 1. shingrix discussed at pharmacy          Essential hypertension S: controlled on lisinopril 74m BP Readings from Last 3 Encounters:  04/19/18 126/82  12/19/17 118/76  10/19/17 130/68  A/P: We discussed blood pressure goal of <140/90. Continue current meds    [place] S: remote history. He wonders about misdiagnosis even. Has had colonoscopy with Dr. GCarlean Purlwithin last few years and was told 10 year follow up.  A/P: will continue to monitor. No blood in stool, no abdominal pain  PSORIASIS OTC ointment- works as well as clobetasol he has been on in past. Sees Dr. WElvera Lennox but mainly just for skin checks.    Vertigo BPV most likely- Meclizine on hand. Had ER trip to [facility] at one point. Has had flare ups  Obesity (BMI 30.0-34.9) BMI over 40 in past with weight peak 296 on our scales.  He cut out snacking for lent- and that really helped him  Gout April 2015 initial episode. Had one flare in last 2 years. Got better with stopping diuretic  Onychomycosis 2 rounds of treatment- may have some dystrophic changes as root cause  Hyperlipidemia S: dad with MI in 422s. Patients LDL at 101 but other #s look pretty good.   A/P: 10-year ASCVD risk 16.9%.  He would prefer to not be on a statin-   I told him I was concerned given his father's history.  We discussed coronary CT option- would have out-of-pocket cost of $150- patient would like to proceed forward with coronary CT to better risk stratify  And aid in decision on statin  Future Appointments  Date Time Provider DGreen Level 10/23/2018  8:15 AM HMarin Olp MD [facility]  10/23/2018  1:00 PM DWilliemae Area RN [facility]   Return in about 6 months  (around 10/19/2018) for physical.  He wants yearly visits after that.  Appears he scheduled annual wellness visit on the same day as physical.   Lab/Order associations: Former smoker - Plan: VAS [place] MEDICARE SCREEN  Hyperlipidemia, unspecified hyperlipidemia type - Plan: CT CARDIAC SCORING Return precautions advised.  SGarret Reddish MD

## 2018-04-19 NOTE — Assessment & Plan Note (Signed)
BMI over 40 in past with weight peak 296 on our scales.  He cut out snacking for lent- and that really helped him

## 2018-04-19 NOTE — Patient Instructions (Addendum)
We will call you within two weeks about your referral for aneurysm screening and CT coronary. If you do not hear within 3 weeks, give [place] a call.   Please check with your pharmacy to see if they have the shingrix vaccine. If they do- please get this immunization and update [place] by phone call or mychart with dates you receive the vaccine  10/19/18 schedule physical with me. Can also schedule wellness visit with nurse on same day.

## 2018-05-03 ENCOUNTER — Ambulatory Visit (INDEPENDENT_AMBULATORY_CARE_PROVIDER_SITE_OTHER)
Admission: RE | Admit: 2018-05-03 | Discharge: 2018-05-03 | Disposition: A | Discharge status: Home / Self Care | Payer: PPO | Source: Ambulatory Visit | Attending: Family Medicine | Admitting: Family Medicine

## 2018-05-03 DIAGNOSIS — E785 Hyperlipidemia, unspecified: Secondary | ICD-10-CM

## 2018-05-04 ENCOUNTER — Other Ambulatory Visit: Payer: Self-pay

## 2018-05-04 MED ORDER — ATORVASTATIN CALCIUM 20 MG PO TABS: 20.0000 mg | ORAL_TABLET | Freq: Every day | ORAL | 3 refills | Status: DC

## 2018-05-08 ENCOUNTER — Other Ambulatory Visit: Payer: Self-pay | Admitting: Internal Medicine

## 2018-05-30 ENCOUNTER — Ambulatory Visit (HOSPITAL_COMMUNITY)
Admission: RE | Admit: 2018-05-30 | Discharge: 2018-05-30 | Disposition: A | Discharge status: Home / Self Care | Payer: PPO | Source: Ambulatory Visit | Attending: Cardiovascular Disease | Admitting: Cardiovascular Disease

## 2018-05-30 DIAGNOSIS — Z87891 Personal history of nicotine dependence: Secondary | ICD-10-CM

## 2018-05-31 ENCOUNTER — Encounter: Payer: Self-pay | Admitting: Family Medicine

## 2018-05-31 DIAGNOSIS — I714 Abdominal aortic aneurysm, without rupture, unspecified: Secondary | ICD-10-CM | POA: Insufficient documentation

## 2018-06-28 ENCOUNTER — Encounter: Payer: Self-pay | Admitting: Family Medicine

## 2018-06-28 ENCOUNTER — Ambulatory Visit (INDEPENDENT_AMBULATORY_CARE_PROVIDER_SITE_OTHER): Payer: PPO | Admitting: Family Medicine

## 2018-06-28 DIAGNOSIS — I10 Essential (primary) hypertension: Secondary | ICD-10-CM

## 2018-06-28 DIAGNOSIS — I714 Abdominal aortic aneurysm, without rupture, unspecified: Secondary | ICD-10-CM

## 2018-06-28 DIAGNOSIS — E785 Hyperlipidemia, unspecified: Secondary | ICD-10-CM

## 2018-06-28 NOTE — Assessment & Plan Note (Signed)
S: controlled on lisinopril 52m BP Readings from Last 3 Encounters:  06/28/18 134/74  04/19/18 126/82  12/19/17 118/76  A/P: We discussed blood pressure goal of <140/90 though with AAA would love if closer to 130/80 or lower- he is pretty close to that today. Continue current meds

## 2018-06-28 NOTE — Progress Notes (Signed)
Subjective:  Edward Goodwin is a 67 y.o. year old very pleasant male patient who presents for/with See problem oriented charting ROS- No chest pain or shortness of breath. No headache or blurry vision.    Past Medical History-  Patient Active Problem List   Diagnosis Date Noted  . Ulcerative colitis ([place]) 08/20/2007    Priority: High  . Former smoker 04/19/2018    Priority: Medium  . Hyperlipidemia 04/19/2018    Priority: Medium  . Vertigo 12/19/2017    Priority: Medium  . Gout 03/24/2014    Priority: Medium  . Elevated PSA, less than 10 ng/ml 09/09/2013    Priority: Medium  . Macular degeneration, bilateral 07/15/2012    Priority: Medium  . Obesity (BMI 30.0-34.9) 12/02/2008    Priority: Medium  . Essential hypertension 08/20/2007    Priority: Medium  . PSORIASIS 08/20/2007    Priority: Medium  . Onychomycosis 12/19/2017    Priority: Low  . Trochanteric bursitis of left hip 03/31/2015    Priority: Low  . AAA (abdominal aortic aneurysm) ([place]) 05/31/2018    Medications- reviewed and updated Current Outpatient Medications  Medication Sig Dispense Refill  . Ascorbic Acid (VITAMIN C PO) Take 1 tablet by mouth daily. 1000 mg    . atorvastatin (LIPITOR) 20 MG tablet Take 1 tablet (20 mg total) by mouth daily. 90 tablet 3  . L-Lysine 1000 MG TABS Take 500 mg by mouth daily.     Marland Kitchen lisinopril (PRINIVIL,ZESTRIL) 20 MG tablet TAKE 1 TABLET BY MOUTH EVERY DAY 90 tablet 1  . meclizine (ANTIVERT) 25 MG tablet Take 1 tablet (25 mg total) by mouth every 6 (six) hours as needed for dizziness. 60 tablet 2  . Multiple Vitamins-Minerals (PRESERVISION AREDS 2) CAPS Take 1 capsule by mouth 2 (two) times daily before lunch and supper.    . terbinafine (LAMISIL) 250 MG tablet Take 1 tablet (250 mg total) by mouth daily. (Patient not taking: Reported on 06/28/2018) 90 tablet 0   No current facility-administered medications for this visit.     Objective: BP 134/74   Pulse (!) 59   Temp 97.6  F (36.4 C) (Oral)   Ht 5' 9"  (1.753 m)   Wt 211 lb 12.8 oz (96.1 kg)   SpO2 95%   BMI 31.28 kg/m  Gen: NAD, resting comfortably CV: RRR no murmurs rubs or gallops Lungs: CTAB no crackles, wheeze, rhonchi Abdomen: soft/nontender/nondistended/normal bowel sounds. No rebound or guarding.  Ext: no edema Skin: warm, dry  Assessment/Plan:  Hyperlipidemia S: suspect controlled on atorvastatin 3m. Lipids below were prior to statin Lab Results  Component Value Date   CHOL 161 10/19/2017   HDL 43.00 10/19/2017   LDLCALC 101 (H) 10/19/2017   TRIG 83.0 10/19/2017   CHOLHDL 4 10/19/2017   A/P: with CT cardiac scoring at 64th percentile for age and 153year risk at 16.9% we opted to start statin. He is doing reasonably well on this  Essential hypertension S: controlled on lisinopril 255mBP Readings from Last 3 Encounters:  06/28/18 134/74  04/19/18 126/82  12/19/17 118/76  A/P: We discussed blood pressure goal of <140/90 though with AAA would love if closer to 130/80 or lower- he is pretty close to that today. Continue current meds  AAA (abdominal aortic aneurysm) ([facility]) S: aaa 3.3 picked up on screening test A/P: we discussed importance of risk factor modification particlarly BP. We will recheck [place] 1 year.    Future Appointments  Date Time Provider  [place]  10/23/2018  8:15 AM [name], MD [facility]  10/23/2018  1:00 PM [facility] HEALTH COACH [facility]   Return precautions advised.  Garret Reddish, MD

## 2018-06-28 NOTE — Patient Instructions (Addendum)
Health Maintenance Due  Topic Date Due  . INFLUENZA VACCINE -Schedule for this Fall 06/14/2018

## 2018-06-28 NOTE — Assessment & Plan Note (Signed)
S: aaa 3.3 picked up on screening test A/P: we discussed importance of risk factor modification particlarly BP. We will recheck [place] in 1 year.

## 2018-06-28 NOTE — Assessment & Plan Note (Signed)
S: suspect controlled on atorvastatin 27m. Lipids below were prior to statin Lab Results  Component Value Date   CHOL 161 10/19/2017   HDL 43.00 10/19/2017   LDLCALC 101 (H) 10/19/2017   TRIG 83.0 10/19/2017   CHOLHDL 4 10/19/2017   A/P: with CT cardiac scoring at 64th percentile for age and 159year risk at 16.9% we opted to start statin. He is doing reasonably well on this

## 2018-06-29 DIAGNOSIS — R300 Dysuria: Secondary | ICD-10-CM | POA: Diagnosis not present

## 2018-06-29 DIAGNOSIS — R351 Nocturia: Secondary | ICD-10-CM | POA: Diagnosis not present

## 2018-06-29 DIAGNOSIS — R3912 Poor urinary stream: Secondary | ICD-10-CM | POA: Diagnosis not present

## 2018-06-29 DIAGNOSIS — R350 Frequency of micturition: Secondary | ICD-10-CM | POA: Diagnosis not present

## 2018-08-01 DIAGNOSIS — R350 Frequency of micturition: Secondary | ICD-10-CM | POA: Diagnosis not present

## 2018-08-01 DIAGNOSIS — R3912 Poor urinary stream: Secondary | ICD-10-CM | POA: Diagnosis not present

## 2018-08-20 ENCOUNTER — Encounter: Payer: Self-pay | Admitting: Family Medicine

## 2018-10-23 ENCOUNTER — Other Ambulatory Visit: Payer: Self-pay | Admitting: Family Medicine

## 2018-10-23 ENCOUNTER — Ambulatory Visit: Payer: PPO

## 2018-10-23 ENCOUNTER — Encounter: Payer: PPO | Admitting: Family Medicine

## 2018-10-25 ENCOUNTER — Ambulatory Visit: Payer: PPO

## 2018-10-25 ENCOUNTER — Encounter: Payer: Self-pay | Admitting: Family Medicine

## 2018-10-25 ENCOUNTER — Ambulatory Visit (INDEPENDENT_AMBULATORY_CARE_PROVIDER_SITE_OTHER): Payer: PPO | Admitting: Family Medicine

## 2018-10-25 VITALS — BP 134/86 | HR 66 | Temp 97.8°F | Ht 69.0 in | Wt 212.4 lb

## 2018-10-25 DIAGNOSIS — M1000 Idiopathic gout, unspecified site: Secondary | ICD-10-CM

## 2018-10-25 DIAGNOSIS — I714 Abdominal aortic aneurysm, without rupture, unspecified: Secondary | ICD-10-CM

## 2018-10-25 DIAGNOSIS — Z6831 Body mass index (BMI) 31.0-31.9, adult: Secondary | ICD-10-CM

## 2018-10-25 DIAGNOSIS — Z0000 Encounter for general adult medical examination without abnormal findings: Secondary | ICD-10-CM

## 2018-10-25 DIAGNOSIS — I10 Essential (primary) hypertension: Secondary | ICD-10-CM

## 2018-10-25 DIAGNOSIS — K5100 Ulcerative (chronic) pancolitis without complications: Secondary | ICD-10-CM | POA: Diagnosis not present

## 2018-10-25 DIAGNOSIS — E785 Hyperlipidemia, unspecified: Secondary | ICD-10-CM | POA: Diagnosis not present

## 2018-10-25 DIAGNOSIS — Z125 Encounter for screening for malignant neoplasm of prostate: Secondary | ICD-10-CM

## 2018-10-25 LAB — LIPID PANEL
Cholesterol: 123 mg/dL (ref 0–200)
HDL: 44.4 mg/dL (ref >39.00)
LDL Cholesterol: 58 mg/dL (ref 0–99)
NonHDL: 78.43
Total CHOL/HDL Ratio: 3
Triglycerides: 102 mg/dL (ref 0.0–149.0)
VLDL: 20.4 mg/dL (ref 0.0–40.0)

## 2018-10-25 LAB — COMPREHENSIVE METABOLIC PANEL
ALK PHOS: 79 U/L (ref 39–117)
ALT: 15 U/L (ref 0–53)
AST: 17 U/L (ref 0–37)
Albumin: 4.8 g/dL (ref 3.5–5.2)
BILIRUBIN TOTAL: 1.3 mg/dL — AB (ref 0.2–1.2)
BUN: 16 mg/dL (ref 6–23)
CALCIUM: 9.4 mg/dL (ref 8.4–10.5)
CHLORIDE: 102 meq/L (ref 96–112)
CO2: 30 mEq/L (ref 19–32)
Creatinine, Ser: 0.9 mg/dL (ref 0.40–1.50)
GFR: 88.81 mL/min (ref >60.00)
GLUCOSE: 101 mg/dL — AB (ref 70–99)
POTASSIUM: 4.4 meq/L (ref 3.5–5.1)
Sodium: 139 mEq/L (ref 135–145)
Total Protein: 6.9 g/dL (ref 6.0–8.3)

## 2018-10-25 LAB — PSA: PSA: 2.92 ng/mL (ref 0.10–4.00)

## 2018-10-25 LAB — CBC
HCT: 45.4 % (ref 39.0–52.0)
Hemoglobin: 15.9 g/dL (ref 13.0–17.0)
MCHC: 35 g/dL (ref 30.0–36.0)
MCV: 85.3 fl (ref 78.0–100.0)
Platelets: 154 10*3/uL (ref 150.0–400.0)
RBC: 5.32 Mil/uL (ref 4.22–5.81)
RDW: 13.9 % (ref 11.5–15.5)
WBC: 6.4 10*3/uL (ref 4.0–10.5)

## 2018-10-25 NOTE — Patient Instructions (Addendum)
Please check with your pharmacy to see if they have the shingrix vaccine. If they do- please get this immunization and update [place] by phone call or mychart with dates you receive the vaccine  Great job over the years with weight loss- lets continue to strive for weight of around 200 or less-fantastic work overall.  Would love for you to add  exercise to your regimen  Please stop by lab before you go

## 2018-10-25 NOTE — Progress Notes (Signed)
Phone: [phone]  Subjective:  Patient presents today for their annual physical. Chief complaint-noted.   See problem oriented charting- ROS- full  review of systems was completed and negative except for: occasional dizziness, rare cough, runny nose at times, joint pain  The following were reviewed and entered/updated in epic: Past Medical History:  Diagnosis Date  . Chicken pox   . Colitis, ulcerative ([place])   . Hypertension   . Macular degeneration, bilateral 07/2012   areds 2   . Psoriasis    Patient Active Problem List   Diagnosis Date Noted  . AAA (abdominal aortic aneurysm) ([place]) 05/31/2018    Priority: High  . Ulcerative colitis ([place]) 08/20/2007    Priority: High  . Former smoker 04/19/2018    Priority: Medium  . Hyperlipidemia 04/19/2018    Priority: Medium  . Vertigo 12/19/2017    Priority: Medium  . Gout 03/24/2014    Priority: Medium  . Elevated PSA, less than 10 ng/ml 09/09/2013    Priority: Medium  . Macular degeneration, bilateral 07/15/2012    Priority: Medium  . Obesity (BMI 30.0-34.9) 12/02/2008    Priority: Medium  . Essential hypertension 08/20/2007    Priority: Medium  . PSORIASIS 08/20/2007    Priority: Medium  . Onychomycosis 12/19/2017    Priority: Low  . Trochanteric bursitis of left hip 03/31/2015    Priority: Low   Past Surgical History:  Procedure Laterality Date  . ACNE CYST REMOVAL    . CATARACT EXTRACTION W/ INTRAOCULAR LENS IMPLANT  10/2014   bilat  . COLONOSCOPY    . FOOT SURGERY  1988  . SHOULDER ARTHROSCOPY  2007   Right  . TONSILLECTOMY      Family History  Problem Relation Age of Onset  . Heart disease Mother   . Birth defects Mother   . Breast cancer Mother   . Hypertension Mother   . Stroke Mother   . Heart disease Father        CAD/MI 17s or 33s smoker. led to death age 18.   Marland Kitchen Hypertension Father   . Diabetes Daughter   . Diabetes Maternal Uncle   . Diabetes Paternal Aunt   . Stroke Maternal  Grandmother   . Arthritis Paternal Grandmother   . Colon cancer Neg Hx   . Esophageal cancer Neg Hx   . Stomach cancer Neg Hx   . Rectal cancer Neg Hx     Medications- reviewed and updated Current Outpatient Medications  Medication Sig Dispense Refill  . Ascorbic Acid (VITAMIN C PO) Take 1 tablet by mouth daily. 1000 mg    . atorvastatin (LIPITOR) 20 MG tablet Take 1 tablet (20 mg total) by mouth daily. 90 tablet 3  . L-Lysine 1000 MG TABS Take 500 mg by mouth daily.     Marland Kitchen lisinopril (PRINIVIL,ZESTRIL) 20 MG tablet TAKE 1 TABLET BY MOUTH EVERY DAY 90 tablet 1  . Multiple Vitamins-Minerals (PRESERVISION AREDS 2) CAPS Take 1 capsule by mouth 2 (two) times daily before lunch and supper.    . meclizine (ANTIVERT) 25 MG tablet Take 1 tablet (25 mg total) by mouth every 6 (six) hours as needed for dizziness. (Patient not taking: Reported on 10/25/2018) 60 tablet 2  . terbinafine (LAMISIL) 250 MG tablet Take 1 tablet (250 mg total) by mouth daily. (Patient not taking: Reported on 06/28/2018) 90 tablet 0   No current facility-administered medications for this visit.     Allergies-reviewed and updated Allergies  Allergen Reactions  .  Sulfonamide Derivatives     "have just been told that my entire life"    Social History   Social History Narrative   Married 1971. 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 [employer] 6 years.       ACP: HCPOA - wife. Yes for acute CPR, no for prolonged mechanical ventilatory support, no for prolonged artificial nutrition or other heroic measures leaving him in an incapacitated state.     Objective: BP 134/86   Pulse 66   Temp 97.8 F (36.6 C) (Oral)   Ht 5' 9"  (1.753 m)   Wt 212 lb 6.4 oz (96.3 kg)   SpO2 97%   BMI 31.37 kg/m  Gen: NAD, resting comfortably HEENT: Mucous membranes are moist. Oropharynx normal Neck: no  thyromegaly CV: RRR no murmurs rubs or gallops Lungs: CTAB no crackles, wheeze, rhonchi Abdomen: soft/nontender/nondistended/normal bowel sounds. No rebound or guarding.  Ext: no edema Skin: warm, dry Neuro: grossly normal, moves all extremities, PERRLA Rectal: normal tone, diffusely enlarged prostate, no masses or tenderness  Assessment/Plan:  67 y.o. male presenting for annual physical.  Health Maintenance counseling: 1. Anticipatory guidance: Patient counseled regarding regular dental exams -q6 months, eye exams -yearly particularly with macular degeneration history, hx cataract surgery,  avoiding smoking and second hand smoke, limiting alcohol to 2 beverages per day- doesn't drink.   2. Risk factor reduction:  Advised patient of need for regular exercise and diet rich and fruits and vegetables to reduce risk of heart attack and stroke. Exercise- active in the yard but we discussed trying to get intentional regular exercise. Diet-we discussed trying to get under 200 within the year- peak weight 292- has gotten down to 203 and then kind of stagnated.  Wt Readings from Last 3 Encounters:  10/25/18 212 lb 6.4 oz (96.3 kg)  06/28/18 211 lb 12.8 oz (96.1 kg)  04/19/18 213 lb (96.6 kg)  3. Immunizations/screenings/ancillary studies-discussed Shingrix vaccine  Immunization History  Administered Date(s) Administered  . Influenza Split 10/03/2011  . Influenza Whole 08/29/2012  . Influenza, High Dose Seasonal PF 08/17/2015, 08/17/2015, 09/01/2016  . Influenza,inj,Quad PF,6+ Mos 09/05/2013, 09/08/2014  . Influenza-Unspecified 08/17/2017, 08/28/2018  . Pneumococcal Conjugate-13 10/08/2013  . Pneumococcal Polysaccharide-23 10/02/2009, 02/04/2015  . Td 10/02/2009  . Zoster 12/04/2009  4. Prostate cancer screening- will get PSA today- trend reassuring. Will get rectal exam due to mild elevation in # of 2.  BPH on exam. Nocturia 2-3x a night Lab Results  Component Value Date   PSA 2.49  10/19/2017   PSA 2.13 09/12/2016   PSA 2.32 09/09/2015   5. Colon cancer screening - 05/22/2015 with 10-year follow-up 6. Skin cancer screening-follows with Dr. Elvera Lennox of [facility] dermatology.advised regular sunscreen use. Denies worrisome, changing, or new skin lesions.  7.  Former smoker-quit 1990-AAA already detected. Not a candidate for lung cancer  screening.   Status of chronic or acute concerns   Ulcerative pancolitis without complication (HCC)-controlled onno active treatment- seems to be remote history with no recent activity. No current issues.he still wonders if it was a misdiagnosis.   Hyperlipidemia, unspecified hyperlipidemia type-hopefully controlled on atorvastatin 20 mg-update lipids- history of CT cardiac scoring at 64th percentile for age  Idiopathic gout, unspecified chronicity, unspecified site-no recent gout flares . Uric acid above goal so if repeat issues consider uric acid lowering agent. Wife rations his  bacon intake.  Lab Results  Component Value Date   LABURIC 7.3 03/24/2014   Essential hypertension-poor control on lisinopril 20 mg initially but improved on repeat- really want to get him closer to 130/80 with AAA- particularly if AAA grows in size over next year . Took medicine later than usual 10 AM today.  BP Readings from Last 3 Encounters:  10/25/18 134/86  06/28/18 134/74  04/19/18 126/82   Abdominal aortic aneurysm (AAA) without rupture (HCC)- 3.3 cm on screening test- repeat ultrasound in 1 year-will be July 2020   Diagnosed with vertigo a few years ago- recently he would feel vertigo if turning over in bed. Has had to sit back down into bed at times with it. No issues with activity recently. Prn meclizine is helping.   MSK:  Rare issues after a fall last year - occasional pain shooting from buttocks into knee but intermittent. Occasional left shoulder pain  May start some vitamin D  No urinary issues- skip UA today.   Future Appointments   Date Time Provider [place]  12/05/2018  1:00 PM [facility]   6 month follow up  Lab/Order associations: Fasting Preventative health care - Plan: CBC, Comprehensive metabolic panel, Lipid panel, PSA  Ulcerative pancolitis without complication (HCC)  Hyperlipidemia, unspecified hyperlipidemia type - Plan: CBC, Comprehensive metabolic panel, Lipid panel  Idiopathic gout, unspecified chronicity, unspecified site  Essential hypertension  Abdominal aortic aneurysm (AAA) without rupture (HCC)  BMI 31.0-31.9,adult  Screening for prostate cancer - Plan: PSA  Return precautions advised.  Garret Reddish, MD

## 2018-11-28 DIAGNOSIS — Z961 Presence of intraocular lens: Secondary | ICD-10-CM | POA: Diagnosis not present

## 2018-11-28 DIAGNOSIS — H353131 Nonexudative age-related macular degeneration, bilateral, early dry stage: Secondary | ICD-10-CM | POA: Diagnosis not present

## 2018-11-28 DIAGNOSIS — Z9849 Cataract extraction status, unspecified eye: Secondary | ICD-10-CM | POA: Diagnosis not present

## 2018-12-05 ENCOUNTER — Ambulatory Visit (INDEPENDENT_AMBULATORY_CARE_PROVIDER_SITE_OTHER): Payer: PPO

## 2018-12-05 VITALS — BP 134/82 | HR 59 | Temp 98.4°F | Ht 69.0 in | Wt 217.2 lb

## 2018-12-05 DIAGNOSIS — Z0000 Encounter for general adult medical examination without abnormal findings: Secondary | ICD-10-CM | POA: Diagnosis not present

## 2018-12-05 NOTE — Patient Instructions (Signed)
Edward Goodwin , Thank you for taking time to come for your Medicare Wellness Visit. I appreciate your ongoing commitment to your health goals. Please review the following plan we discussed and let me know if I can assist you in the future.   These are the goals we discussed: Goals    . Weight (lb) < 200 lb (90.7 kg)     Patient has a goal of getting to 200lb for his physical this year       This is a list of the screening recommended for you and due dates:  Health Maintenance  Topic Date Due  . Tetanus Vaccine  10/03/2019  . Colon Cancer Screening  05/21/2025  . Flu Shot  Completed  .  Hepatitis C: One time screening is recommended by Center for Disease Control  (CDC) for  adults born from [birth year] through 1967.   Completed  . Pneumonia vaccines  Completed    Preventive Care for Adults  A healthy lifestyle and preventive care can promote health and wellness. Preventive health guidelines for adults include the following key practices.  . A routine yearly physical is a good way to check with your health care provider about your health and preventive screening. It is a chance to share any concerns and updates on your health and to receive a thorough exam.  . Visit your dentist for a routine exam and preventive care every 6 months. Brush your teeth twice a day and floss once a day. Good oral hygiene prevents tooth decay and gum disease.  . The frequency of eye exams is based on your age, health, family medical history, use  of contact lenses, and other factors. Follow your health care provider's recommendations for frequency of eye exams.  . Eat a healthy diet. Foods like vegetables, fruits, whole grains, low-fat dairy products, and lean protein foods contain the nutrients you need without too many calories. Decrease your intake of foods high in solid fats, added sugars, and salt. Eat the right amount of calories for you. Get information about a proper diet from your health care provider, if  necessary.  . Regular physical exercise is one of the most important things you can do for your health. Most adults should get at least 150 minutes of moderate-intensity exercise (any activity that increases your heart rate and causes you to sweat) each week. In addition, most adults need muscle-strengthening exercises on 2 or more days a week.  Silver Sneakers may be a benefit available to you. To determine eligibility, you may visit the website: www.silversneakers.com or contact program at 707-745-4025 Mon-Fri between 8AM-8PM.   . Maintain a healthy weight. The body mass index (BMI) is a screening tool to identify possible weight problems. It provides an estimate of body fat based on height and weight. Your health care provider can find your BMI and can help you achieve or maintain a healthy weight.   For adults 20 years and older: ? A BMI below 18.5 is considered underweight. ? A BMI of 18.5 to 24.9 is normal. ? A BMI of 25 to 29.9 is considered overweight. ? A BMI of 30 and above is considered obese.   . Maintain normal blood lipids and cholesterol levels by exercising and minimizing your intake of saturated fat. Eat a balanced diet with plenty of fruit and vegetables. Blood tests for lipids and cholesterol should begin at age 4 and be repeated every 5 years. If your lipid or cholesterol levels are high, you are over  50, or you are at high risk for heart disease, you may need your cholesterol levels checked more frequently. Ongoing high lipid and cholesterol levels should be treated with medicines if diet and exercise are not working.  . If you smoke, find out from your health care provider how to quit. If you do not use tobacco, please do not start.  . If you choose to drink alcohol, please do not consume more than 2 drinks per day. One drink is considered to be 12 ounces (355 mL) of beer, 5 ounces (148 mL) of wine, or 1.5 ounces (44 mL) of liquor.  . If you are 34-48 years old, ask your  health care provider if you should take aspirin to prevent strokes.  . Use sunscreen. Apply sunscreen liberally and repeatedly throughout the day. You should seek shade when your shadow is shorter than you. Protect yourself by wearing long sleeves, pants, a wide-brimmed hat, and sunglasses year round, whenever you are outdoors.  . Once a month, do a whole body skin exam, using a mirror to look at the skin on your back. Tell your health care provider of new moles, moles that have irregular borders, moles that are larger than a pencil eraser, or moles that have changed in shape or color.

## 2018-12-05 NOTE — Progress Notes (Signed)
I have reviewed and agree with note, evaluation, plan.  I appreciate patient being offered an appointment for dizziness and fall.  We will follow-up as he agrees to  Garret Reddish, MD

## 2018-12-05 NOTE — Progress Notes (Signed)
Subjective:   Edward Goodwin is a 68 y.o. male who presents for Medicare Annual/Subsequent preventive examination.  Review of Systems:  No ROS.  Medicare Wellness Visit. Additional risk factors are reflected in the social history. Cardiac Risk Factors include: advanced age (>24mn, >>74women);male gender Patient lives in a 1.5 story home with his wife [place]arine [place] 470years. [place] He has a granddaughter who is in [place] Patient enjoys playing golf. He plays 1-2 times a week most of the year. He enjoys reading, has a few tv shows he enjoys.   Patient goes to bed around 9:30-10:00pm. No CPAP. He gets up about 1-2 times a night to go to the bathroom a night. He gets up around 6:30am. Feels rested most days.    Objective:    Vitals: BP 134/82 (BP Location: Left Arm, Patient Position: Sitting, Cuff Size: Large)   Pulse (!) 59   Temp 98.4 F (36.9 C) (Oral)   Ht 5' 9"  (1.753 m)   Wt 217 lb 3.2 oz (98.5 kg)   SpO2 96%   BMI 32.07 kg/m   Body mass index is 32.07 kg/m.  Advanced Directives 12/05/2018 04/08/2017 05/22/2015 05/08/2015  Does Patient Have a Medical Advance Directive? Yes Yes Yes Yes  Type of AParamedicof [place]Living will [place] will [place] will [place] will  Does patient want to make changes to medical advance directive? No - Patient declined - - No - Patient declined  Copy of [place] Chart? Yes - validated most recent copy scanned in chart (See row information) No - copy requested - No - copy requested    Tobacco Social History   Tobacco Use  Smoking Status Former Smoker  . Last attempt to quit: 10/23/1989  . Years since quitting: 29.1  Smokeless Tobacco Never Used     Counseling given: Not Answered    Past Medical History:  Diagnosis Date  . Chicken pox   . Colitis, ulcerative ([place]   . Hypertension   .  Macular degeneration, bilateral 07/2012   areds 2   . Psoriasis    Past Surgical History:  Procedure Laterality Date  . ACNE CYST REMOVAL    . CATARACT EXTRACTION W/ INTRAOCULAR LENS IMPLANT  10/2014   bilat  . COLONOSCOPY    . FOOT SURGERY  1988  . SHOULDER ARTHROSCOPY  2007   Right  . TONSILLECTOMY     Family History  Problem Relation Age of Onset  . Heart disease Mother   . Breast cancer Mother   . Hypertension Mother   . Stroke Mother   . Hypothyroidism Mother   . Dementia Mother   . Heart disease Father        CAD/MI 89sor 89ssmoker. led to death age 70   .Marland KitchenHypertension Father   . Diabetes Daughter   . Diabetes Maternal Uncle   . Hepatitis C Brother   . Stroke Maternal Grandmother   . Emphysema Maternal Grandfather   . Arthritis Paternal Grandmother   . Colon cancer Neg Hx   . Esophageal cancer Neg Hx   . Stomach cancer Neg Hx   . Rectal cancer Neg Hx    Social History   Socioeconomic History  . Marital status: Married    Spouse name: Not on file  . Number of children: 1  . Years of education: 114 .  Highest education level: Not on file  Occupational History  . Occupation: Herbalist  Social Needs  . Financial resource strain: Not on file  . Food insecurity:    Worry: Not on file    Inability: Not on file  . Transportation needs:    Medical: Not on file    Non-medical: Not on file  Tobacco Use  . Smoking status: Former Smoker    Last attempt to quit: 10/23/1989    Years since quitting: 29.1  . Smokeless tobacco: Never Used  Substance and Sexual Activity  . Alcohol use: Yes    Alcohol/week: 0.0 standard drinks    Comment: rare glass of wine - less than once a year  . Drug use: No  . Sexual activity: Not Currently  Lifestyle  . Physical activity:    Days per week: Not on file    Minutes per session: Not on file  . Stress: Not on file  Relationships  . Social connections:    Talks on phone: Not on file    Gets together: Not on file     Attends religious service: Not on file    Active member of club or organization: Not on file    Attends meetings of clubs or organizations: Not on file    Relationship status: Not on file  Other Topics Concern  . Not on file  Social History Narrative   Married 1971. 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  [employer] 6 years.       ACP: HCPOA - wife. Yes for acute CPR, no for prolonged mechanical ventilatory support, no for prolonged artificial nutrition or other heroic measures leaving him in an incapacitated state.     Outpatient Encounter Medications as of 12/05/2018  Medication Sig  . Ascorbic Acid (VITAMIN C PO) Take 1 tablet by mouth daily. 1000 mg  . atorvastatin (LIPITOR) 20 MG tablet Take 1 tablet (20 mg total) by mouth daily.  Marland Kitchen L-Lysine 1000 MG TABS Take 500 mg by mouth daily.   Marland Kitchen lisinopril (PRINIVIL,ZESTRIL) 20 MG tablet TAKE 1 TABLET BY MOUTH EVERY DAY  . meclizine (ANTIVERT) 25 MG tablet Take 1 tablet (25 mg total) by mouth every 6 (six) hours as needed for dizziness.  . Multiple Vitamins-Minerals (PRESERVISION AREDS 2) CAPS Take 1 capsule by mouth 2 (two) times daily before lunch and supper.  . terbinafine (LAMISIL) 250 MG tablet Take 1 tablet (250 mg total) by mouth daily.   No facility-administered encounter medications on file as of 12/05/2018.     Activities of Daily Living In your present state of health, do you have any difficulty performing the following activities: 12/05/2018  Hearing? N  Vision? N  Difficulty concentrating or making decisions? N  Walking or climbing stairs? N  Dressing or bathing? N  Doing errands, shopping? N  Preparing Food and eating ? N  Using the Toilet? N  In the past six months, have you accidently leaked urine? N  Do you have problems with loss of bowel control? N  Managing your Medications? N  Managing your  Finances? N  Housekeeping or managing your Housekeeping? N  Some recent data might be hidden    Patient Care Team: [name], MD as PCP - General (Family Medicine)   Assessment:   This is a routine wellness examination for Edward Goodwin.  Exercise Activities and Dietary recommendations Current Exercise Habits: The patient does not participate in regular exercise at present(Silver Sneakers at [facility], hasn't been back since signing up), Exercise limited by: None identified   Breakfast: 2 scrambled eggs, bacon, fruit, toast, goes out to eat  Breakfast on Saturday, drinks orange juice or coffee  Lunch: Half a sandwich, a boiled egg, few potato chips, diet Pepsi or water  Dinner:Chicken Salad, ritz crackers, meat, vegetables, pizza, normally drinks water  Considers himself a Engineer, petroleum    . Weight (lb) < 200 lb (90.7 kg)     Patient has a goal of getting to 200lb for his physical this year       Fall Risk Fall Risk  12/05/2018 10/25/2018 12/19/2017 09/23/2016 09/14/2015  Falls in the past year? 1 1 Yes Yes No  Comment - Fell in the snow a year ago - - -  Number falls in past yr: 0 0 1 1 -  Comment tripped on uneven surface in driveway - - - -  Injury with Fall? 1 0 No No -  Comment Scraped face and hands in fall - - - -  Risk for fall due to : - - - Impaired balance/gait -     Depression Screen PHQ 2/9 Scores 12/05/2018 10/25/2018 12/19/2017 09/23/2016  PHQ - 2 Score 0 0 0 0    Cognitive Function MMSE - Mini Mental State Exam 12/05/2018  Orientation to time 4  Orientation to Place 5  Registration 3  Attention/ Calculation 5  Recall 3  Language- name 2 objects 2  Language- repeat 1  Language- follow 3 step command 3  Language- read & follow direction 1  Write a sentence 1  Copy design 1  Total score 29        Immunization History  Administered Date(s) Administered  . Influenza Split 10/03/2011  . Influenza Whole 08/29/2012  . Influenza, High Dose Seasonal PF  08/17/2015, 08/17/2015, 09/01/2016  . Influenza,inj,Quad PF,6+ Mos 09/05/2013, 09/08/2014  . Influenza-Unspecified 08/17/2017, 08/28/2018  . Pneumococcal Conjugate-13 10/08/2013  . Pneumococcal Polysaccharide-23 10/02/2009, 02/04/2015  . Td 10/02/2009  . Zoster 12/04/2009      Screening Tests Health Maintenance  Topic Date Due  . TETANUS/TDAP  10/03/2019  . COLONOSCOPY  05/21/2025  . INFLUENZA VACCINE  Completed  . Hepatitis C Screening  Completed  . PNA vac Low Risk Adult  Completed         Plan:  Follow Up with PCP as Advised  I have personally reviewed and noted the following in the patient's chart:   . Medical and social history . Use of alcohol, tobacco or illicit drugs  . Current medications and supplements . Functional ability and status . Nutritional status . Physical activity . Advanced directives . List of other physicians . Vitals . Screenings to include cognitive, depression, and falls . Referrals and appointments  In addition, I have reviewed and discussed with patient certain preventive protocols, quality metrics, and best practice recommendations. A written personalized care plan for preventive services as well as general preventive health recommendations were provided to patient.     [place], [place]  [id number]

## 2018-12-05 NOTE — Progress Notes (Signed)
PCP notes: Last OV 10/25/2018   Health maintenance: Up to Date   Abnormal screenings: None MMSE score of 29   Patient concerns: Feels dizziness is getting more frequently. Offered an appointment but declined   Nurse concerns: Patinet tripped in the driveway yesterday and fell. His face (cheek and chin) are scraped up along with both hands. He states he has a sore wrist but declined an appointment to be evaluated. Instructed if wrist not better by end of week to call and schedule to be evaluated. Also discussed Td that is due by end of year. Encouraged patient to get, especially with recent fall and abrasions.  Advanced Directive brought in to visit, I made a copy and am sending this to be scanned in to patient's chart   Next PCP appt: Schedule as Needed

## 2018-12-30 DIAGNOSIS — R05 Cough: Secondary | ICD-10-CM | POA: Diagnosis not present

## 2018-12-30 DIAGNOSIS — R509 Fever, unspecified: Secondary | ICD-10-CM | POA: Diagnosis not present

## 2018-12-30 DIAGNOSIS — J101 Influenza due to other identified influenza virus with other respiratory manifestations: Secondary | ICD-10-CM | POA: Diagnosis not present

## 2018-12-31 ENCOUNTER — Telehealth: Payer: Self-pay | Admitting: Family Medicine

## 2018-12-31 NOTE — Telephone Encounter (Signed)
Patient was seen at [facility] urgent care yesterday. He tested positive for the Flu and was given Tamiflu.

## 2018-12-31 NOTE — Telephone Encounter (Signed)
Caller states Pt. has a headache, non-productive cough, temp of 99.4 oral, and nasal congestion, runny nose with clear discharge. Symptoms started today. Pt. has had Sudafed about 2pm and Nyquil about 6-7.  PER [facility]

## 2019-01-22 ENCOUNTER — Encounter: Payer: Self-pay | Admitting: Family Medicine

## 2019-01-22 ENCOUNTER — Ambulatory Visit: Payer: Self-pay | Admitting: *Deleted

## 2019-01-22 ENCOUNTER — Ambulatory Visit (INDEPENDENT_AMBULATORY_CARE_PROVIDER_SITE_OTHER): Payer: PPO | Admitting: Family Medicine

## 2019-01-22 VITALS — BP 136/79 | HR 53 | Temp 98.2°F | Ht 69.0 in | Wt 213.4 lb

## 2019-01-22 DIAGNOSIS — G8929 Other chronic pain: Secondary | ICD-10-CM | POA: Diagnosis not present

## 2019-01-22 DIAGNOSIS — H8112 Benign paroxysmal vertigo, left ear: Secondary | ICD-10-CM | POA: Diagnosis not present

## 2019-01-22 DIAGNOSIS — M545 Low back pain, unspecified: Secondary | ICD-10-CM

## 2019-01-22 DIAGNOSIS — R42 Dizziness and giddiness: Secondary | ICD-10-CM | POA: Diagnosis not present

## 2019-01-22 DIAGNOSIS — M25531 Pain in right wrist: Secondary | ICD-10-CM | POA: Diagnosis not present

## 2019-01-22 DIAGNOSIS — M25532 Pain in left wrist: Secondary | ICD-10-CM

## 2019-01-22 NOTE — Patient Instructions (Addendum)
We will call you within a week about your referral to vestibular rehab/physical therapy. If you do not hear within a week, give [place] a call.   Please schedule a visit with our excellent sports medicine physician Dr. Paulla Fore before you leave at the check out desk so he can further evaluate your low back pain and wrist pain  If you have new or worsening symptoms please let us know

## 2019-01-22 NOTE — Telephone Encounter (Signed)
Pt was seen today.

## 2019-01-22 NOTE — Progress Notes (Signed)
Phone [phone]   Subjective:  Edward Goodwin is a 68 y.o. year old very pleasant male patient who presents for/with See problem oriented charting ROS- denies dehydration, nausea, earache, headache, blurry vision, weakness.  Does have some fatigue. No facial or extremity weakness. No slurred words or trouble swallowing. no blurry vision or double vision. No paresthesias. No confusion or word finding difficulties.    Past Medical History-  Patient Active Problem List   Diagnosis Date Noted  . AAA (abdominal aortic aneurysm) ([place]) 05/31/2018    Priority: High  . Ulcerative colitis ([place]) 08/20/2007    Priority: High  . Former smoker 04/19/2018    Priority: Medium  . Hyperlipidemia 04/19/2018    Priority: Medium  . Vertigo 12/19/2017    Priority: Medium  . Gout 03/24/2014    Priority: Medium  . Elevated PSA, less than 10 ng/ml 09/09/2013    Priority: Medium  . Macular degeneration, bilateral 07/15/2012    Priority: Medium  . Obesity (BMI 30.0-34.9) 12/02/2008    Priority: Medium  . Essential hypertension 08/20/2007    Priority: Medium  . PSORIASIS 08/20/2007    Priority: Medium  . Onychomycosis 12/19/2017    Priority: Low  . Trochanteric bursitis of left hip 03/31/2015    Priority: Low    Medications- reviewed and updated Current Outpatient Medications  Medication Sig Dispense Refill  . Ascorbic Acid (VITAMIN C PO) Take 1 tablet by mouth daily. 1000 mg    . atorvastatin (LIPITOR) 20 MG tablet Take 1 tablet (20 mg total) by mouth daily. 90 tablet 3  . L-Lysine 1000 MG TABS Take 500 mg by mouth daily.     Marland Kitchen lisinopril (PRINIVIL,ZESTRIL) 20 MG tablet TAKE 1 TABLET BY MOUTH EVERY DAY 90 tablet 1  . meclizine (ANTIVERT) 25 MG tablet Take 1 tablet (25 mg total) by mouth every 6 (six) hours as needed for dizziness. 60 tablet 2  . Multiple Vitamins-Minerals (PRESERVISION AREDS 2) CAPS Take 1 capsule by mouth 2 (two) times daily before lunch and supper.    . terbinafine  (LAMISIL) 250 MG tablet Take 1 tablet (250 mg total) by mouth daily. 90 tablet 0   No current facility-administered medications for this visit.      Objective:  BP 136/79 (BP Location: Left Arm, Patient Position: Sitting, Cuff Size: Large)   Pulse (!) 53   Temp 98.2 F (36.8 C) (Oral)   Ht 5' 9"  (1.753 m)   Wt 213 lb 6.1 oz (96.8 kg)   SpO2 98%   BMI 31.51 kg/m  Gen: NAD, resting comfortably CV: RRR no murmurs rubs or gallops Lungs: CTAB no crackles, wheeze, rhonchi Ext: no edema Skin: warm, dry Neuro: CN II-XII intact, sensation and reflexes normal throughout, 5/5 muscle strength in bilateral upper and lower extremities. Normal finger to nose. Normal rapid alternating movements. No pronator drift. Normal romberg. Normal gait.  Dix-Hallpike test to the left is positive for nystagmus and reproduces symptoms    Assessment and Plan   Chronic bilateral low back pain, unspecified whether sciatica present - Plan: Ambulatory referral to Sports Medicine Also Bilateral wrist pain S:Back pain 6-8 months If laying on right side- now has progressed and bothering him even if sitting in certain positions. Has pain go down to the right knee at times when laying down (seems to skip the thigh) and gets burning sensation. Also, he had a fall January 21 and continues to have some bilateral wrist pain. A/P: Low back pain wrist pain left  wrist pain after fall and back pain worsening after a fall over a year ago. -For these 2 issues-patient would like a referral to Dr. Paulla Fore of sports medicine after discussion today  Vertigo S: Patient complains of a spinning sensation-particularly when getting out of bed.  He is tried meclizine with minimal relief.  He was diagnosed with vertigo several years ago- notes show "peripheral vertigo". He states may 2018. In February 2019- saw Dr. Sarajane Jews who recommended claritin and meclizine.   He states most commonly in bed or when getting up. Turns over in bed and bed starts  moving. Happens sometimes with sitting to standing but less frequent.   Episodes last under 30 seconds A/P: Episodes appear classic for BPPV.  Patient with positive Dix-Hallpike.  Neurological exam reassuring.  Strongly suspect peripheral cause.  No increased tinnitus or hearing loss-doubt Mnire's disease. -Can continue meclizine as needed - Refer to vestibular rehab   Future Appointments  Date Time Provider [place]  01/31/2019  3:20 PM Gerda Diss, DO [facility]  12/11/2019  1:00 PM [facility] HEALTH COACH [facility]   No follow-ups on file.  Lab/Order associations: Benign paroxysmal positional vertigo of left ear - Plan: Ambulatory referral to Physical Therapy  Chronic bilateral low back pain, unspecified whether sciatica present - Plan: Ambulatory referral to Sports Medicine  Bilateral wrist pain  Vertigo  Return precautions advised.  Garret Reddish, MD

## 2019-01-22 NOTE — Telephone Encounter (Signed)
Pt stated he has been dx with vertigo several years ago. He had an episode this morning that he almost fell in the kitchen. He had to catch himself.  He feels like it is getting worst. He has taken the meclizine as prescribe, regularly this past week and it is not working.  The dizziness comes and goes.  Sometimes it feels like the walls are moving. This morning his b/p was 121/74 and HR 55. He has not taken his b/p medication yet.  He denies nausea, earache, headache, blurred vision or weakness when this happened this morning. Sometimes he feels tired but not now. Denies dehydration.  Appointment scheduled with his provider. Advised to call back for any concerns before then. Pt and wife voiced understanding.  Reason for Disposition . [1] NO dizziness now AND [2] age > 39  Answer Assessment - Initial Assessment Questions 1. DESCRIPTION: "Describe your dizziness."     Room spinning at times 2. VERTIGO: "Do you feel like either you or the room is spinning or tilting?"      At times 3. LIGHTHEADED: "Do you feel lightheaded?" (e.g., somewhat faint, woozy, weak upon standing)     Woozy once this morning 4. SEVERITY: "How bad is it?"  "Can you walk?"   - MILD - Feels unsteady but walking normally.   - MODERATE - Feels very unsteady when walking, but not falling; interferes with normal activities (e.g., school, work) .   - SEVERE - Unable to walk without falling (requires assistance).     Can walk, almost lost balance this morning 5. ONSET:  "When did the dizziness begin?"     Been dx with vertigo for several years, just started getting worst 6. AGGRAVATING FACTORS: "Does anything make it worse?" (e.g., standing, change in head position)     Turning over in the bed or getting out of the bed 7. CAUSE: "What do you think is causing the dizziness?"     Not sure 8. RECURRENT SYMPTOM: "Have you had dizziness before?" If so, ask: "When was the last time?" "What happened that time?"     Several  years when first diagnosed 9. OTHER SYMPTOMS: "Do you have any other symptoms?" (e.g., headache, weakness, numbness, vomiting, earache)     no 10. PREGNANCY: "Is there any chance you are pregnant?" "When was your last menstrual period?"       n/a  Protocols used: DIZZINESS - VERTIGO-A-AH

## 2019-01-22 NOTE — Assessment & Plan Note (Signed)
S: Patient complains of a spinning sensation-particularly when getting out of bed.  He is tried meclizine with minimal relief.  He was diagnosed with vertigo several years ago- notes show "peripheral vertigo". He states may 2018. In February 2019- saw Dr. Sarajane Jews who recommended claritin and meclizine.   He states most commonly in bed or when getting up. Turns over in bed and bed starts moving. Happens sometimes with sitting to standing but less frequent.   Episodes last under 30 seconds A/P: Episodes appear classic for BPPV.  Patient with positive Dix-Hallpike.  Neurological exam reassuring.  Strongly suspect peripheral cause.  No increased tinnitus or hearing loss-doubt Mnire's disease. -Can continue meclizine as needed - Refer to vestibular rehab

## 2019-01-25 DIAGNOSIS — H8112 Benign paroxysmal vertigo, left ear: Secondary | ICD-10-CM | POA: Diagnosis not present

## 2019-01-25 DIAGNOSIS — R42 Dizziness and giddiness: Secondary | ICD-10-CM | POA: Diagnosis not present

## 2019-01-29 ENCOUNTER — Ambulatory Visit: Payer: PPO | Admitting: Sports Medicine

## 2019-01-31 ENCOUNTER — Ambulatory Visit (INDEPENDENT_AMBULATORY_CARE_PROVIDER_SITE_OTHER): Payer: PPO | Admitting: Sports Medicine

## 2019-01-31 ENCOUNTER — Encounter: Payer: Self-pay | Admitting: Sports Medicine

## 2019-01-31 ENCOUNTER — Other Ambulatory Visit: Payer: Self-pay

## 2019-01-31 VITALS — BP 122/68 | HR 65 | Ht 69.0 in | Wt 211.4 lb

## 2019-01-31 DIAGNOSIS — M25532 Pain in left wrist: Secondary | ICD-10-CM | POA: Diagnosis not present

## 2019-01-31 DIAGNOSIS — M545 Low back pain, unspecified: Secondary | ICD-10-CM

## 2019-01-31 DIAGNOSIS — G8929 Other chronic pain: Secondary | ICD-10-CM

## 2019-01-31 DIAGNOSIS — M24551 Contracture, right hip: Secondary | ICD-10-CM | POA: Diagnosis not present

## 2019-01-31 DIAGNOSIS — M25531 Pain in right wrist: Secondary | ICD-10-CM

## 2019-01-31 DIAGNOSIS — R29898 Other symptoms and signs involving the musculoskeletal system: Secondary | ICD-10-CM | POA: Diagnosis not present

## 2019-01-31 MED ORDER — MELOXICAM 15 MG PO TABS: 15.0000 mg | ORAL_TABLET | Freq: Every day | ORAL | 2 refills | Status: DC

## 2019-01-31 NOTE — Progress Notes (Signed)
Edward Goodwin. Edward Goodwin, Edward Goodwin at [facility] [phone]  [name] - 68 y.o. male MRN [id number]  Date of birth: 1950/12/07  Visit Date: January 31, 2019  PCP: Edward Olp, MD   Referred by: Edward Olp, MD  SUBJECTIVE:   Chief Complaint  Patient presents with  . New Patient (Initial Visit)    Chronic LBP.  Ref to PT by Edward Goodwin    HPI: Patient is here for 2 issues.  Chronic low back pain that is worsened over the past several years but significantly exacerbated after he slipped and fell in December 2018 landing directly on his buttock.  He also slipped in January 2020 while going to play golf on an uneven surface in his garage and had a similar fall directly onto his bottom.  Pain is moderate and mainly aching.  Sometimes sharp but is localized to the right low back and into the right gluteal region.  Occasionally does radiate down towards his knee and thigh but only occasionally and nothing past his knee.  It is worsened with side lying on his right side, sitting and driving.  He additionally reports bilateral wrist pain since the fall and has improvements with strapping while golfing.  He has not tried any specific medications for this and denies any significant mechanical symptoms.  Loaded wrist extension does seem to exacerbate his pain.  REVIEW OF SYSTEMS: Night time disturbances: Reports, If laying on his right side. Fevers, chills and night sweats: Denies Unexplained weight loss: Denies Personal history of cancer: Denies Changes in bowel or bladder habits: Denies Recent unreported falls: Reports 2 falls in 2018 and most recently in January 2020 New or worsening dyspnea or wheezing: Denies Headaches and dizziness: Denies Numbness, tingling and weakness in the extremities: Denies Dizziness or presyncopal episodes: Denies Lower extremity edema: Denies  HISTORY:  Prior history reviewed and updated per electronic medical  record.  Patient Active Problem List   Diagnosis Date Noted  . AAA (abdominal aortic aneurysm) ([place]) 05/31/2018    3.3 cm 05/31/18   . Former smoker 04/19/2018    Quit smoking 1990. At least 5 pack years. AAA screen 2019   . Hyperlipidemia 04/19/2018    2019 10-year ASCVD risk 16.9%--> atorvastatin 70m   . Vertigo 12/19/2017    BPV most likely- Meclizine on hand. Had ER trip to [facility] at one point. Has had flare ups   . Onychomycosis 12/19/2017    2 rounds of treatment- may have some dystrophic changes as root cause   . Trochanteric bursitis of left hip 03/31/2015  . Gout 03/24/2014    April 2015 initial episode   . Elevated PSA, less than 10 ng/ml 09/09/2013    Monitoring yearly Lab Results  Component Value Date   PSA 2.49 10/19/2017   PSA 2.13 09/12/2016   PSA 2.32 09/09/2015      . Macular degeneration, bilateral 07/15/2012  . Obesity (BMI 30.0-34.9) 12/02/2008    BMI over 40 in past with weight peak 296 on our scales.  He cut out snacking for lent- and that really helped him    . Essential hypertension 08/20/2007    Lisinopril 295m In past: ACE-I, diuretic   . [place]    Remote    . PSORIASIS 08/20/2007    OTC ointment- works as well as clobetasol he has been on in past. Sees Dr. WhElvera Lennoxbut mainly just for skin checks.  Social History   Occupational History  . Occupation: Herbalist  Tobacco Use  . Smoking status: Former Smoker    Last attempt to quit: 10/23/1989    Years since quitting: 29.2  . Smokeless tobacco: Never Used  Substance and Sexual Activity  . Alcohol use: Yes    Alcohol/week: 0.0 standard drinks    Comment: rare glass of wine - less than once a year  . Drug use: No  . Sexual activity: Not Currently   Social History   Social History Narrative   Married 1971. 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 [employer] 6 years.       ACP: HCPOA - wife. Yes for acute CPR, no for prolonged mechanical ventilatory support, no for prolonged artificial nutrition or other heroic measures leaving him in an incapacitated state.    Past Medical History:  Diagnosis Date  . Chicken pox   . Colitis, ulcerative ([place])   . Hypertension   . Macular degeneration, bilateral 07/2012   areds 2   . Psoriasis    Past Surgical History:  Procedure Laterality Date  . ACNE CYST REMOVAL    . CATARACT EXTRACTION W/ INTRAOCULAR LENS IMPLANT  10/2014   bilat  . COLONOSCOPY    . FOOT SURGERY  1988  . SHOULDER ARTHROSCOPY  2007   Right  . TONSILLECTOMY     family history includes Arthritis in his paternal grandmother; Breast cancer in his mother; Dementia in his mother; Diabetes in his daughter and maternal uncle; Emphysema in his maternal grandfather; Heart disease in his father and mother; Hepatitis C in his brother; Hypertension in his father and mother; Hypothyroidism in his mother; Stroke in his maternal grandmother and mother. There is no history of Colon cancer, Esophageal cancer, Stomach cancer, or Rectal cancer.  OBJECTIVE:  VS:  HT:5' 9"  (175.3 cm)   WT:211 lb 6.4 oz (95.9 kg)  BMI:31.2    BP:122/68  HR:65bpm  TEMP: ( )  RESP:95 %   PHYSICAL EXAM: CONSTITUTIONAL: Well-developed, Well-nourished and In no acute distress EYES: Pupils are equal., EOM intact without nystagmus. and No scleral icterus. Psychiatric: Alert & appropriately interactive. and Not depressed or anxious appearing. EXTREMITY EXAM: Warm and well perfused  Back exam: Overall well aligned without significant deformity.  He has negative straight leg raise bilaterally.  Good internal and external rotation of bilateral hips.  Lower extremity strength is 5+/5 in all myotomes.  Lower extremity sensation intact light touch.  His right hip flexor has a marked contracture compared to the left and he  is right anteriorly rotated innominate.  He has slight paraspinal muscle spasms and tenderness over the right lower lumbar facets.  Negative for pain with palpation of the greater sciatic notch as well as with popliteal compression test.  Bilateral wrist are well aligned without significant swelling or effusion.  He does have some pain over the DRUJ's bilaterally.  Grip strength is intact.  Slight pain with terminal wrist extension.   ASSESSMENT:   1. Chronic bilateral low back pain, unspecified whether sciatica present   2. Bilateral wrist pain   3. Weakness of right hip   4. Right hip flexor tightness     PROCEDURES:  PROCEDURE NOTE: THERAPEUTIC EXERCISES (16109)  Discussed the foundation of treatment for this condition is physical therapy and/or daily (5-6 days/week) therapeutic exercises,  focusing on core strengthening, coordination, neuromuscular control/reeducation. 15 minutes spent for Therapeutic exercises as below and as referenced in the AVS. This included exercises focusing on stretching, strengthening, with significant focus on eccentric aspects.  Proper technique shown and discussed handout in great detail with ATC. All questions were discussed and answered.  Long term goals include an improvement in range of motion, strength, endurance as well as avoiding reinjury. Frequency of visits is one time as determined during today's office visit. Frequency of exercises to be performed is as per handout. EXERCISES REVIEWED: Archie Balboa Exercises Pelvic recruitment/tilting Hip flexor stretching Piriformis stretch     PLAN:  Pertinent additional documentation may be included in corresponding procedure notes, imaging studies, problem based documentation and patient instructions.  No problem-specific Assessment & Plan notes found for this encounter.   Ultimately seems to be mechanical in nature.  Unfortunately were limited from the standpoint of x-rays at this time due to COVID-19.  No  red flag symptoms at this time.  No radicular symptoms.  Suspect he has moderate spondylosis of his lumbar spine with axial low back pain.  Will benefit from therapeutic exercises previously prescribed as well as hopefully the meloxicam that he should take for the next 2 weeks then as needed.  His wrists do not do seem to have any significant focal structural issue based on exam but if any lack of improvement with the 2 weeks of meloxicam could consider further diagnostic evaluation with x-rays.  Presenter, broadcasting Program: These exercises were developed by Minerva Ends, DC with a strong emphasis on core neuromuscular reducation and postural realignment through body-weight exercises. Daily practice encouarged and links to [employer] provided today per Patient Instructions.   Home Therapeutic Exercises: New program prescribed today per procedure note.  Ideally nonsteroidal anti-inflammatories (NSAIDs) should not be taken on a chronic daily basis.  Appropriate use of these medications involves "bursting" full prescription strength dosing of the medication over a relatively short amount of time.  Ideally taking full doses of anti-inflammatories as discussed for 3 to 5 days at a time then stopping all NSAID use for as long as possible is the ideal way to reduce the risk of kidney/liver injury as well as GI bleeding.  Bursting a medication for up to 2 weeks to initiate then as needed for 3-5 days at a time (1-2 days after symptoms improve) can help minimize this risk.  This was discussed in detail with the patient today as well as appropriate GI prophylaxis with a PPI/H2 blocker.  Activity modifications and the importance of avoiding exacerbating activities (limiting pain to no more than a 4 / 10 during or following activity) recommended and discussed.   Discussed red flag symptoms that warrant earlier emergent evaluation and patient voices understanding.    Meds ordered this encounter   Medications  . meloxicam (MOBIC) 15 MG tablet    Sig: Take 1 tablet (15 mg total) by mouth daily. Take X 2 weeks then take PRN    Dispense:  30 tablet    Refill:  2   Lab Orders  No laboratory test(s) ordered today   Imaging Orders  No imaging studies ordered today   Referral Orders  No referral(s) requested today    Return in about 4 weeks (around 02/28/2019).          Gerda Diss, [place] [facility] Physician

## 2019-01-31 NOTE — Patient Instructions (Addendum)
Also check out [employer]" which is a program developed by Dr. Minerva Ends.   There are links to a couple of his YouTube Videos below and I would like to see you performing one of his videos 5-6 days per week.  It is best to do these exercises first thing in the morning.  They will give you a good jumpstart here today and start normalizing the way you move.  A good intro video is: "Independence from Pain 7-minute Video" - [email or web address]   A more advanced video is: Interior and spatial designer original 12 minutes" - [email or web address].king-greer.com/  Exercises that focus more on the neck are as below: Dr. Archie Balboa with [place] teaching neck and shoulder details Part 1 - https://youtu.be/[email or web address] Part 2 Dr. Archie Balboa with [facility] quick routine to practice daily - https://youtu.be/[email or web address]  Do not try to attempt the entire video when first beginning.  Try breaking of each exercise that he goes into shorter segments.  In other words, if they perform an exercise for 45 seconds, start with 15 seconds and rest and then resume when they begin the new activity.  If you work your way up to being able to do these videos without having to stop, I expect you will see significant improvements in your pain.  If you enjoy his videos and would like to find out more you can look on his website: [email or web address]  He has a workout streaming option as well as a DVD set available for purchase.  Amazon has the best price for his DVDs.     Please perform the exercise program that we have prepared for you and gone over in detail on a daily basis.  In addition to the handout you were provided you can access your program through: [email or web address]-[email or web address]   Your unique program code is:  [email or web address]

## 2019-02-28 ENCOUNTER — Ambulatory Visit: Payer: PPO | Admitting: Sports Medicine

## 2019-04-04 DIAGNOSIS — L821 Other seborrheic keratosis: Secondary | ICD-10-CM | POA: Diagnosis not present

## 2019-04-04 DIAGNOSIS — L400 Psoriasis vulgaris: Secondary | ICD-10-CM | POA: Diagnosis not present

## 2019-04-04 DIAGNOSIS — D1801 Hemangioma of skin and subcutaneous tissue: Secondary | ICD-10-CM | POA: Diagnosis not present

## 2019-04-17 ENCOUNTER — Other Ambulatory Visit: Payer: Self-pay | Admitting: Family Medicine

## 2019-08-27 ENCOUNTER — Other Ambulatory Visit: Payer: Self-pay

## 2019-08-27 ENCOUNTER — Ambulatory Visit (INDEPENDENT_AMBULATORY_CARE_PROVIDER_SITE_OTHER): Payer: PPO

## 2019-08-27 ENCOUNTER — Encounter: Payer: Self-pay | Admitting: Family Medicine

## 2019-08-27 DIAGNOSIS — Z23 Encounter for immunization: Secondary | ICD-10-CM

## 2019-11-01 ENCOUNTER — Other Ambulatory Visit: Payer: Self-pay | Admitting: Family Medicine

## 2019-11-19 ENCOUNTER — Other Ambulatory Visit: Payer: Self-pay

## 2019-11-19 DIAGNOSIS — Z125 Encounter for screening for malignant neoplasm of prostate: Secondary | ICD-10-CM

## 2019-11-19 DIAGNOSIS — E785 Hyperlipidemia, unspecified: Secondary | ICD-10-CM

## 2019-11-19 DIAGNOSIS — I10 Essential (primary) hypertension: Secondary | ICD-10-CM

## 2019-11-20 ENCOUNTER — Other Ambulatory Visit (INDEPENDENT_AMBULATORY_CARE_PROVIDER_SITE_OTHER): Payer: PPO

## 2019-11-20 DIAGNOSIS — I10 Essential (primary) hypertension: Secondary | ICD-10-CM

## 2019-11-20 DIAGNOSIS — E785 Hyperlipidemia, unspecified: Secondary | ICD-10-CM | POA: Diagnosis not present

## 2019-11-20 DIAGNOSIS — Z125 Encounter for screening for malignant neoplasm of prostate: Secondary | ICD-10-CM | POA: Diagnosis not present

## 2019-11-20 LAB — COMPREHENSIVE METABOLIC PANEL
ALT: 12 U/L (ref 0–53)
AST: 14 U/L (ref 0–37)
Albumin: 4.4 g/dL (ref 3.5–5.2)
Alkaline Phosphatase: 70 U/L (ref 39–117)
BUN: 17 mg/dL (ref 6–23)
CO2: 30 mEq/L (ref 19–32)
Calcium: 9.1 mg/dL (ref 8.4–10.5)
Chloride: 105 mEq/L (ref 96–112)
Creatinine, Ser: 0.84 mg/dL (ref 0.40–1.50)
GFR: 90.2 mL/min (ref >60.00)
Glucose, Bld: 92 mg/dL (ref 70–99)
Potassium: 4.1 mEq/L (ref 3.5–5.1)
Sodium: 141 mEq/L (ref 135–145)
Total Bilirubin: 0.9 mg/dL (ref 0.2–1.2)
Total Protein: 6.2 g/dL (ref 6.0–8.3)

## 2019-11-20 LAB — LIPID PANEL
Cholesterol: 111 mg/dL (ref 0–200)
HDL: 43.9 mg/dL (ref >39.00)
LDL Cholesterol: 54 mg/dL (ref 0–99)
NonHDL: 66.61
Total CHOL/HDL Ratio: 3
Triglycerides: 65 mg/dL (ref 0.0–149.0)
VLDL: 13 mg/dL (ref 0.0–40.0)

## 2019-11-20 LAB — CBC WITH DIFFERENTIAL/PLATELET
Basophils Absolute: 0 10*3/uL (ref 0.0–0.1)
Basophils Relative: 0.4 % (ref 0.0–3.0)
Eosinophils Absolute: 0.1 10*3/uL (ref 0.0–0.7)
Eosinophils Relative: 1.2 % (ref 0.0–5.0)
HCT: 42.4 % (ref 39.0–52.0)
Hemoglobin: 14.4 g/dL (ref 13.0–17.0)
Lymphocytes Relative: 17.2 % (ref 12.0–46.0)
Lymphs Abs: 0.9 10*3/uL (ref 0.7–4.0)
MCHC: 33.9 g/dL (ref 30.0–36.0)
MCV: 85.9 fl (ref 78.0–100.0)
Monocytes Absolute: 0.5 10*3/uL (ref 0.1–1.0)
Monocytes Relative: 9.8 % (ref 3.0–12.0)
Neutro Abs: 3.7 10*3/uL (ref 1.4–7.7)
Neutrophils Relative %: 71.4 % (ref 43.0–77.0)
Platelets: 140 10*3/uL — ABNORMAL LOW (ref 150.0–400.0)
RBC: 4.94 Mil/uL (ref 4.22–5.81)
RDW: 14.2 % (ref 11.5–15.5)
WBC: 5.1 10*3/uL (ref 4.0–10.5)

## 2019-11-20 LAB — PSA: PSA: 2.56 ng/mL (ref 0.10–4.00)

## 2019-11-22 ENCOUNTER — Other Ambulatory Visit: Payer: Self-pay

## 2019-11-25 ENCOUNTER — Other Ambulatory Visit: Payer: Self-pay

## 2019-11-25 ENCOUNTER — Encounter: Payer: Self-pay | Admitting: Family Medicine

## 2019-11-25 ENCOUNTER — Ambulatory Visit (INDEPENDENT_AMBULATORY_CARE_PROVIDER_SITE_OTHER): Payer: PPO | Admitting: Family Medicine

## 2019-11-25 VITALS — BP 136/68 | HR 63 | Temp 98.1°F | Ht 69.0 in | Wt 190.2 lb

## 2019-11-25 DIAGNOSIS — I714 Abdominal aortic aneurysm, without rupture, unspecified: Secondary | ICD-10-CM

## 2019-11-25 DIAGNOSIS — H3530 Unspecified macular degeneration: Secondary | ICD-10-CM | POA: Diagnosis not present

## 2019-11-25 DIAGNOSIS — L408 Other psoriasis: Secondary | ICD-10-CM | POA: Diagnosis not present

## 2019-11-25 DIAGNOSIS — I10 Essential (primary) hypertension: Secondary | ICD-10-CM

## 2019-11-25 DIAGNOSIS — K5100 Ulcerative (chronic) pancolitis without complications: Secondary | ICD-10-CM

## 2019-11-25 DIAGNOSIS — Z0000 Encounter for general adult medical examination without abnormal findings: Secondary | ICD-10-CM

## 2019-11-25 DIAGNOSIS — M1000 Idiopathic gout, unspecified site: Secondary | ICD-10-CM | POA: Diagnosis not present

## 2019-11-25 DIAGNOSIS — Z23 Encounter for immunization: Secondary | ICD-10-CM | POA: Diagnosis not present

## 2019-11-25 DIAGNOSIS — R42 Dizziness and giddiness: Secondary | ICD-10-CM

## 2019-11-25 DIAGNOSIS — H353211 Exudative age-related macular degeneration, right eye, with active choroidal neovascularization: Secondary | ICD-10-CM | POA: Diagnosis not present

## 2019-11-25 DIAGNOSIS — E785 Hyperlipidemia, unspecified: Secondary | ICD-10-CM

## 2019-11-25 MED ORDER — TETANUS-DIPHTH-ACELL PERTUSSIS 5-2.5-18.5 LF-MCG/0.5 IM SUSP: 0.5000 mL | Freq: Once | INTRAMUSCULAR | 0 refills | Status: AC

## 2019-11-25 NOTE — Patient Instructions (Addendum)
Health Maintenance Due  Topic Date Due  . Samul Dada will get at pharmacy  10/03/2019   We will call you within two weeks about your referral for aneurysm follow up. If you do not hear within 3 weeks, give [place] a call.   Keep an eye on blood pressure perhaps once a week with goal <138/88 - ideally we would keep you less than 130/80 if you tolerate it.   Recommended follow up: Return in about 6 months (around 05/24/2020) for follow up- or sooner if needed. definitely 1 year for physical at latest.

## 2019-11-25 NOTE — Progress Notes (Signed)
Phone: [phone]   Subjective:  Patient presents today for their annual physical. Chief complaint-noted.   See problem oriented charting- Review of Systems  Constitutional: Positive for weight loss.  HENT: Positive for hearing loss and tinnitus.   Eyes: Negative.   Respiratory: Negative.   Cardiovascular: Negative.   Gastrointestinal: Negative.   Genitourinary: Negative.   Musculoskeletal: Negative.   Skin: Negative.   Neurological: Positive for dizziness.  Endo/Heme/Allergies: Positive for environmental allergies.  Psychiatric/Behavioral: Negative.   weight loss intentional. Dizziness related to BPPV.   The following were reviewed and entered/updated in epic: Past Medical History:  Diagnosis Date  . Chicken pox   . Colitis, ulcerative ([employer])   . Hypertension   . Macular degeneration, bilateral 07/2012   areds 2   . Psoriasis    Patient Active Problem List   Diagnosis Date Noted  . AAA (abdominal aortic aneurysm) ([place]) 05/31/2018    Priority: High  . Ulcerative colitis ([place]) 08/20/2007    Priority: High  . Former smoker 04/19/2018    Priority: Medium  . Hyperlipidemia 04/19/2018    Priority: Medium  . Vertigo 12/19/2017    Priority: Medium  . Gout 03/24/2014    Priority: Medium  . Elevated PSA, less than 10 ng/ml 09/09/2013    Priority: Medium  . Macular degeneration, bilateral 07/15/2012    Priority: Medium  . Obesity (BMI 30.0-34.9) 12/02/2008    Priority: Medium  . Essential hypertension 08/20/2007    Priority: Medium  . PSORIASIS 08/20/2007    Priority: Medium  . Onychomycosis 12/19/2017    Priority: Low  . Trochanteric bursitis of left hip 03/31/2015    Priority: Low   Past Surgical History:  Procedure Laterality Date  . ACNE CYST REMOVAL    . CATARACT EXTRACTION W/ INTRAOCULAR LENS IMPLANT  10/2014   bilat  . COLONOSCOPY    . FOOT SURGERY  1988  . SHOULDER ARTHROSCOPY  2007   Right  . TONSILLECTOMY      Family History  Problem  Relation Age of Onset  . Heart disease Mother   . Breast cancer Mother   . Hypertension Mother   . Stroke Mother   . Hypothyroidism Mother   . Dementia Mother   . Heart disease Father        CAD/MI 21s or 87s smoker. led to death age 39.   Marland Kitchen Hypertension Father   . Diabetes Daughter   . Diabetes Maternal Uncle   . Hepatitis C Brother   . Stroke Maternal Grandmother   . Emphysema Maternal Grandfather   . Arthritis Paternal Grandmother   . Colon cancer Neg Hx   . Esophageal cancer Neg Hx   . Stomach cancer Neg Hx   . Rectal cancer Neg Hx     Medications- reviewed and updated Current Outpatient Medications  Medication Sig Dispense Refill  . Ascorbic Acid (VITAMIN C PO) Take 1 tablet by mouth daily. 1000 mg    . atorvastatin (LIPITOR) 20 MG tablet TAKE 1 TABLET BY MOUTH EVERY DAY 90 tablet 3  . L-Lysine 1000 MG TABS Take 500 mg by mouth daily.     Marland Kitchen lisinopril (ZESTRIL) 20 MG tablet TAKE 1 TABLET BY MOUTH EVERY DAY 90 tablet 1  . meclizine (ANTIVERT) 25 MG tablet Take 1 tablet (25 mg total) by mouth every 6 (six) hours as needed for dizziness. 60 tablet 2  . Multiple Vitamins-Minerals (PRESERVISION AREDS 2) CAPS Take 1 capsule by mouth 2 (two) times daily before lunch  and supper.    . meloxicam (MOBIC) 15 MG tablet Take 1 tablet (15 mg total) by mouth daily. Take X 2 weeks then take PRN (Patient not taking: Reported on 11/25/2019) 30 tablet 2  . Tdap (BOOSTRIX) 5-2.5-18.5 LF-MCG/0.5 injection Inject 0.5 mLs into the muscle once for 1 dose. 0.5 mL 0   No current facility-administered medications for this visit.    Allergies-reviewed and updated Allergies  Allergen Reactions  . Sulfonamide Derivatives     "have just been told that my entire life"    Social History   Social History Narrative   Married 1971. 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 [employer] 6  years.       ACP: HCPOA - wife. Yes for acute CPR, no for prolonged mechanical ventilatory support, no for prolonged artificial nutrition or other heroic measures leaving him in an incapacitated state.    Objective  Objective:  BP 136/68   Pulse 63   Temp 98.1 F (36.7 C) (Oral)   Ht 5' 9"  (1.753 m)   Wt 190 lb 3.2 oz (86.3 kg)   SpO2 97%   BMI 28.09 kg/m  Gen: NAD, resting comfortably HEENT: Mask not removed due to covid 19. TM normal. Bridge of nose normal. Eyelids normal.  Neck: no thyromegaly or cervical lymphadenopathy  CV: RRR no murmurs rubs or gallops Lungs: CTAB no crackles, wheeze, rhonchi Abdomen: soft/nontender/nondistended/normal bowel sounds. No rebound or guarding.  Ext: no edema Skin: warm, dry Neuro: grossly normal, moves all extremities, PERRLA    Assessment and Plan  69 y.o. male presenting for annual physical.  Health Maintenance counseling: 1. Anticipatory guidance: Patient counseled regarding regular dental exams q6 months, eye exams yearly (more regular with macular degeneration),  avoiding smoking and second hand smoke , limiting alcohol to 2 beverages per day.  Doesn't drink 2. Risk factor reduction:  Advised patient of need for regular exercise and diet rich and fruits and vegetables to reduce risk of heart attack and stroke by exercising by playsing golf when the weather permits, active in his yard . Diet-Patient says that he has a balanced diet. 21 lbs down from last year- he has done an amazing job and is down from 271 max. We opted to focus on maintenance at this point with improved blood sugars.  Wt Readings from Last 3 Encounters:  11/25/19 190 lb 3.2 oz (86.3 kg)  01/31/19 211 lb 6.4 oz (95.9 kg)  01/22/19 213 lb 6.1 oz (96.8 kg)  3. Immunizations/screenings/ancillary studies- discussed tdap at pharmacy Discussed. He is interested in a COVID-19 vaccination when available  Immunization History  Administered Date(s) Administered  . Fluad Quad(high  Dose 65+) 08/27/2019  . Influenza Split 10/03/2011  . Influenza Whole 08/29/2012  . Influenza, High Dose Seasonal PF 08/17/2015, 08/17/2015, 09/01/2016, 08/22/2018  . Influenza,inj,Quad PF,6+ Mos 09/05/2013, 09/08/2014  . Influenza-Unspecified 08/17/2017, 08/28/2018  . Pneumococcal Conjugate-13 10/08/2013  . Pneumococcal Polysaccharide-23 10/02/2009, 02/04/2015  . Td 10/02/2009  . Zoster 12/04/2009  . Zoster Recombinat (Shingrix) 12/28/2018, 10/05/2019  4. Prostate cancer screening- we trended PSA this year due to mild elevation last year-numbers came back down.  Low risk trend overall.  Discussed potentially discontinuing prostate cancer screening and we opted to discontinue unless signfiicant change in urinary symptoms. Nocturia if drinks a lot towards nighttime  Lab Results  Component Value Date  PSA 2.56 11/20/2019   PSA 2.92 10/25/2018   PSA 2.49 10/19/2017   5. Colon cancer screening - next due 05/21/2025-last completed 05/22/2015 and reports being normal 6. Skin cancer screening-Dr. Elvera Lennox for derm checks.  Advised regular sunscreen use. Denies worrisome, changing, or new skin lesions.  7. Former smoker -quit smoking 1990.  Mild aneurysm on AAA screen previously.  No other screening recommended  Status of chronic or acute concerns   Hyperlipidemia, unspecified hyperlipidemia type-compliant with atorvastatin 20 mg.  Excellent control on labs with LDL under 70  Lab Results  Component Value Date   CHOL 111 11/20/2019   HDL 43.90 11/20/2019   LDLCALC 54 11/20/2019   TRIG 65.0 11/20/2019   CHOLHDL 3 11/20/2019    Idiopathic gout, unspecified chronicity, unspecified site-denies recent flares.at least in last year. Cut down on bacon a lot and that helped and less red meat Lab Results  Component Value Date   LABURIC 7.3 03/24/2014   - we did not check uric acid and opted to do this only if recurrent gout flares  Abdominal aortic aneurysm (AAA) without rupture ([facility])-3.3 cm in 2019  in July-we will update at this time.  Need to control risk factors  Vertigo-history of BPPV. Meclizine on hand.  No recent flare ups.  - does note hearing loss and tinnitus but apparently these predated BPPV- wears hearing aids.   Ulcerative pancolitis without complication (HCC)-remote history of this.  No recent issues such as rectal bleeding or abdominal cramping.  CBC stable with no anemia. He feels it was a misdiagnosis   PSORIASIS-over-the-counter ointment works well-can continue   Macular degeneration of both eyes, unspecified type-close follow-up with ophthalmology. New info this AM- right eye now dealing with wet macular degeneration. Sees Dr. Jule Ser tomorrow.   Essential hypertension-patient compliant with lisinopril 20 mg.  On repeat todayimproved- still would prefer systolic # under 267. At a blood drive at church he reports were even better.   BP Readings from Last 3 Encounters:  11/25/19 136/68  01/31/19 122/68  01/22/19 136/79   Need for Tdap vaccination-printed for him to take at the pharmacy   Recommended follow up: Return in about 6 months (around 05/24/2020) for follow up- or sooner if needed. Future Appointments  Date Time Provider [place]  12/09/2019  9:00 AM [facility] HEALTH COACH [facility]    Chief Complaint  Patient presents with  . Annual Exam   Lab/Order associations: not fasting but labs were done last week.    ICD-10-CM   1. Preventative health care  Z00.00   2. Hyperlipidemia, unspecified hyperlipidemia type  E78.5   3. Idiopathic gout, unspecified chronicity, unspecified site  M10.00   4. Abdominal aortic aneurysm (AAA) without rupture ([place])  I71.4 VAS [place] AAA DUPLEX  5. Vertigo  R42   6. Ulcerative pancolitis without complication ([place])  T24.58   7. PSORIASIS  L40.8   8. Macular degeneration of both eyes, unspecified type  H35.30   9. Essential hypertension  I10   10. Need for Tdap vaccination  Z23 Tdap ([place]) 5-2.5-18.5 LF-MCG/0.5  injection    Meds ordered this encounter  Medications  . Tdap (BOOSTRIX) 5-2.5-18.5 LF-MCG/0.5 injection    Sig: Inject 0.5 mLs into the muscle once for 1 dose.    Dispense:  0.5 mL    Refill:  0    Return precautions advised.  Garret Reddish, MD

## 2019-11-26 DIAGNOSIS — H35033 Hypertensive retinopathy, bilateral: Secondary | ICD-10-CM | POA: Diagnosis not present

## 2019-11-26 DIAGNOSIS — H353122 Nonexudative age-related macular degeneration, left eye, intermediate dry stage: Secondary | ICD-10-CM | POA: Diagnosis not present

## 2019-11-26 DIAGNOSIS — H353211 Exudative age-related macular degeneration, right eye, with active choroidal neovascularization: Secondary | ICD-10-CM | POA: Diagnosis not present

## 2019-11-27 ENCOUNTER — Telehealth (HOSPITAL_COMMUNITY): Payer: Self-pay

## 2019-11-27 NOTE — Telephone Encounter (Signed)

## 2019-11-28 ENCOUNTER — Other Ambulatory Visit: Payer: Self-pay

## 2019-11-28 ENCOUNTER — Ambulatory Visit (HOSPITAL_COMMUNITY)
Admission: RE | Admit: 2019-11-28 | Discharge: 2019-11-28 | Disposition: A | Discharge status: Home / Self Care | Payer: PPO | Source: Ambulatory Visit | Attending: Family Medicine | Admitting: Family Medicine

## 2019-11-28 DIAGNOSIS — I714 Abdominal aortic aneurysm, without rupture, unspecified: Secondary | ICD-10-CM

## 2019-11-29 ENCOUNTER — Telehealth: Payer: Self-pay

## 2019-11-29 NOTE — Progress Notes (Signed)
Great news-prior aneurysm of the proximal aorta is no longer present.  It was previously listed as 3.3 x 3.25 cm-over 3 cm is considered an aneurysm.  In reflection it looks like they think the aorta was not well visualized last time which made the read more difficult.  Regardless there is no aneurysm now and we do not have to continue to monitor.  If you would strongly prefer to do 1 more check we could do that in 2 years perhaps

## 2019-11-29 NOTE — Telephone Encounter (Signed)
Patient stated that he missed a call from this office and would like for someone  to call him at [phone]

## 2019-12-02 NOTE — Telephone Encounter (Signed)
See lab result for documentation.

## 2019-12-09 ENCOUNTER — Ambulatory Visit (INDEPENDENT_AMBULATORY_CARE_PROVIDER_SITE_OTHER): Payer: PPO

## 2019-12-09 ENCOUNTER — Other Ambulatory Visit: Payer: Self-pay

## 2019-12-09 DIAGNOSIS — Z0000 Encounter for general adult medical examination without abnormal findings: Secondary | ICD-10-CM | POA: Diagnosis not present

## 2019-12-09 NOTE — Patient Instructions (Signed)
Edward Goodwin , Thank you for taking time to come for your Medicare Wellness Visit. I appreciate your ongoing commitment to your health goals. Please review the following plan we discussed and let me know if I can assist you in the future.   Screening recommendations/referrals: Colorectal Screening: up to date; last 05/22/15 (repeat in 2026)  Vision and Dental Exams: Recommended annual ophthalmology exams for early detection of glaucoma and other disorders of the eye Recommended annual dental exams for proper oral hygiene  Vaccinations: Influenza vaccine: up to date; last 08/27/19 Pneumococcal vaccine: up to date; last 02/04/15 Tdap vaccine: up to date; last 11/26/19  Shingles vaccine: Shingrix completed   Advanced directives: We have received a copy of your POA (Power of [place]) and/or Living Will. These documents can be located in your chart.  Goals: Recommend to drink at least 6-8 8oz glasses of water per day and consume a balanced diet rich in fresh fruits and vegetables.   Next appointment: Please schedule your Annual Wellness Visit with your Nurse Health Advisor in one year.  Preventive Care 71 Years and Older, Male Preventive care refers to lifestyle choices and visits with your health care provider that can promote health and wellness. What does preventive care include?  A yearly physical exam. This is also called an annual well check.  Dental exams once or twice a year.  Routine eye exams. Ask your health care provider how often you should have your eyes checked.  Personal lifestyle choices, including:  Daily care of your teeth and gums.  Regular physical activity.  Eating a healthy diet.  Avoiding tobacco and drug use.  Limiting alcohol use.  Practicing safe sex.  Taking low doses of aspirin every day if recommended by your health care provider..  Taking vitamin and mineral supplements as recommended by your health care provider. What happens during an annual well  check? The services and screenings done by your health care provider during your annual well check will depend on your age, overall health, lifestyle risk factors, and family history of disease. Counseling  Your health care provider may ask you questions about your:  Alcohol use.  Tobacco use.  Drug use.  Emotional well-being.  Home and relationship well-being.  Sexual activity.  Eating habits.  History of falls.  Memory and ability to understand (cognition).  Work and work Statistician. Screening  You may have the following tests or measurements:  Height, weight, and BMI.  Blood pressure.  Lipid and cholesterol levels. These may be checked every 5 years, or more frequently if you are over 64 years old.  Skin check.  Lung cancer screening. You may have this screening every year starting at age 23 if you have a 30-pack-year history of smoking and currently smoke or have quit within the past 15 years.  Fecal occult blood test (FOBT) of the stool. You may have this test every year starting at age 58.  Flexible sigmoidoscopy or colonoscopy. You may have a sigmoidoscopy every 5 years or a colonoscopy every 10 years starting at age 96.  Prostate cancer screening. Recommendations will vary depending on your family history and other risks.  Hepatitis C blood test.  Hepatitis B blood test.  Sexually transmitted disease (STD) testing.  Diabetes screening. This is done by checking your blood sugar (glucose) after you have not eaten for a while (fasting). You may have this done every 1-3 years.  Abdominal aortic aneurysm (AAA) screening. You may need this if you are a current  or former smoker.  Osteoporosis. You may be screened starting at age 68 if you are at high risk. Talk with your health care provider about your test results, treatment options, and if necessary, the need for more tests. Vaccines  Your health care provider may recommend certain vaccines, such as:   Influenza vaccine. This is recommended every year.  Tetanus, diphtheria, and acellular pertussis (Tdap, Td) vaccine. You may need a Td booster every 10 years.  Zoster vaccine. You may need this after age 61.  Pneumococcal 13-valent conjugate (PCV13) vaccine. One dose is recommended after age 51.  Pneumococcal polysaccharide (PPSV23) vaccine. One dose is recommended after age 66. Talk to your health care provider about which screenings and vaccines you need and how often you need them. This information is not intended to replace advice given to you by your health care provider. Make sure you discuss any questions you have with your health care provider. Document Released: 11/27/2015 Document Revised: 07/20/2016 Document Reviewed: 09/01/2015 Elsevier Interactive Patient Education  2017 [place] Prevention in the Home Falls can cause injuries. They can happen to people of all ages. There are many things you can do to make your home safe and to help prevent falls. What can I do on the outside of my home?  Regularly fix the edges of walkways and driveways and fix any cracks.  Remove anything that might make you trip as you walk through a door, such as a raised step or threshold.  Trim any bushes or trees on the path to your home.  Use bright outdoor lighting.  Clear any walking paths of anything that might make someone trip, such as rocks or tools.  Regularly check to see if handrails are loose or broken. Make sure that both sides of any steps have handrails.  Any raised decks and porches should have guardrails on the edges.  Have any leaves, snow, or ice cleared regularly.  Use sand or salt on walking paths during winter.  Clean up any spills in your garage right away. This includes oil or grease spills. What can I do in the bathroom?  Use night lights.  Install grab bars by the toilet and in the tub and shower. Do not use towel bars as grab bars.  Use non-skid mats  or decals in the tub or shower.  If you need to sit down in the shower, use a plastic, non-slip stool.  Keep the floor dry. Clean up any water that spills on the floor as soon as it happens.  Remove soap buildup in the tub or shower regularly.  Attach bath mats securely with double-sided non-slip rug tape.  Do not have throw rugs and other things on the floor that can make you trip. What can I do in the bedroom?  Use night lights.  Make sure that you have a light by your bed that is easy to reach.  Do not use any sheets or blankets that are too big for your bed. They should not hang down onto the floor.  Have a firm chair that has side arms. You can use this for support while you get dressed.  Do not have throw rugs and other things on the floor that can make you trip. What can I do in the kitchen?  Clean up any spills right away.  Avoid walking on wet floors.  Keep items that you use a lot in easy-to-reach places.  If you need to reach something above you,  use a strong step stool that has a grab bar.  Keep electrical cords out of the way.  Do not use floor polish or wax that makes floors slippery. If you must use wax, use non-skid floor wax.  Do not have throw rugs and other things on the floor that can make you trip. What can I do with my stairs?  Do not leave any items on the stairs.  Make sure that there are handrails on both sides of the stairs and use them. Fix handrails that are broken or loose. Make sure that handrails are as long as the stairways.  Check any carpeting to make sure that it is firmly attached to the stairs. Fix any carpet that is loose or worn.  Avoid having throw rugs at the top or bottom of the stairs. If you do have throw rugs, attach them to the floor with carpet tape.  Make sure that you have a light switch at the top of the stairs and the bottom of the stairs. If you do not have them, ask someone to add them for you. What else can I do to  help prevent falls?  Wear shoes that:  Do not have high heels.  Have rubber bottoms.  Are comfortable and fit you well.  Are closed at the toe. Do not wear sandals.  If you use a stepladder:  Make sure that it is fully opened. Do not climb a closed stepladder.  Make sure that both sides of the stepladder are locked into place.  Ask someone to hold it for you, if possible.  Clearly mark and make sure that you can see:  Any grab bars or handrails.  First and last steps.  Where the edge of each step is.  Use tools that help you move around (mobility aids) if they are needed. These include:  Canes.  Walkers.  Scooters.  Crutches.  Turn on the lights when you go into a dark area. Replace any light bulbs as soon as they burn out.  Set up your furniture so you have a clear path. Avoid moving your furniture around.  If any of your floors are uneven, fix them.  If there are any pets around you, be aware of where they are.  Review your medicines with your doctor. Some medicines can make you feel dizzy. This can increase your chance of falling. Ask your doctor what other things that you can do to help prevent falls. This information is not intended to replace advice given to you by your health care provider. Make sure you discuss any questions you have with your health care provider. Document Released: 08/27/2009 Document Revised: 04/07/2016 Document Reviewed: 12/05/2014 Elsevier Interactive Patient Education  2017 [employer].

## 2019-12-09 NOTE — Progress Notes (Signed)
This visit is being conducted via phone call due to the COVID-19 pandemic. This patient has given me verbal consent via phone to conduct this visit, patient states they are participating from their home address. Some vital signs may be absent or patient reported.   Patient identification: identified by name, DOB, and current address.  Location provider: [facility], Office Persons participating in the virtual visit: Denman George LPN, patient, and Dr. Garret Reddish   Subjective:   Edward Goodwin is a 69 y.o. male who presents for Medicare Annual/Subsequent preventive examination.  Review of Systems:   Cardiac Risk Factors include: advanced age (>6mn, >>67women);male gender;hypertension;dyslipidemia    Objective:    Vitals: There were no vitals taken for this visit.  There is no height or weight on file to calculate BMI.  Advanced Directives 12/09/2019 12/05/2018 04/08/2017 05/22/2015 05/08/2015  Does Patient Have a Medical Advance Directive? Yes Yes Yes Yes Yes  Type of Advance Directive Living will;Healthcare Power of [place] will [place] will [place] will  Does patient want to make changes to medical advance directive? No - Patient declined No - Patient declined - - No - Patient declined  Copy of [place] Chart? Yes - validated most recent copy scanned in chart (See row information) Yes - validated most recent copy scanned in chart (See row information) No - copy requested - No - copy requested    Tobacco Social History   Tobacco Use  Smoking Status Former Smoker  . Quit date: 10/23/1989  . Years since quitting: 30.1  Smokeless Tobacco Never Used     Counseling given: Not Answered   Clinical Intake:  Pre-visit preparation completed: Yes  Pain : No/denies pain  Diabetes: No  How often do you need to have someone  help you when you read instructions, pamphlets, or other written materials from your doctor or pharmacy?: 1 - Never  Interpreter Needed?: No  Information entered by :: CDenman GeorgeLPN  Past Medical History:  Diagnosis Date  . Chicken pox   . Colitis, ulcerative ([place]   . Hypertension   . Macular degeneration, bilateral 07/2012   areds 2   . Psoriasis    Past Surgical History:  Procedure Laterality Date  . ACNE CYST REMOVAL    . CATARACT EXTRACTION W/ INTRAOCULAR LENS IMPLANT  10/2014   bilat  . COLONOSCOPY    . FOOT SURGERY  1988  . SHOULDER ARTHROSCOPY  2007   Right  . TONSILLECTOMY     Family History  Problem Relation Age of Onset  . Heart disease Mother   . Breast cancer Mother   . Hypertension Mother   . Stroke Mother   . Hypothyroidism Mother   . Dementia Mother   . Heart disease Father        CAD/MI 349sor 461ssmoker. led to death age 71   .Marland KitchenHypertension Father   . Diabetes Daughter   . Diabetes Maternal Uncle   . Hepatitis C Brother   . Stroke Maternal Grandmother   . Emphysema Maternal Grandfather   . Arthritis Paternal Grandmother   . Colon cancer Neg Hx   . Esophageal cancer Neg Hx   . Stomach cancer Neg Hx   . Rectal cancer Neg Hx    Social History   Socioeconomic History  . Marital status: Married    Spouse name: Not on file  .  Number of children: 1  . Years of education: 56  . Highest education level: Not on file  Occupational History  . Occupation: Herbalist  Tobacco Use  . Smoking status: Former Smoker    Quit date: 10/23/1989    Years since quitting: 30.1  . Smokeless tobacco: Never Used  Substance and Sexual Activity  . Alcohol use: Yes    Alcohol/week: 0.0 standard drinks    Comment: rare glass of wine - less than once a year  . Drug use: No  . Sexual activity: Not Currently  Other Topics Concern  . Not on file  Social History Narrative   Married 1971. 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 [employer] 6 years.       ACP: HCPOA - wife. Yes for acute CPR, no for prolonged mechanical ventilatory support, no for prolonged artificial nutrition or other heroic measures leaving him in an incapacitated state.    Social Determinants of Health   Financial Resource Strain:   . Difficulty of Paying Living Expenses: Not on file  Food Insecurity:   . Worried About Charity fundraiser in the Last Year: Not on file  . Ran Out of Food in the Last Year: Not on file  Transportation Needs:   . Lack of Transportation (Medical): Not on file  . Lack of Transportation (Non-Medical): Not on file  Physical Activity:   . Days of Exercise per Week: Not on file  . Minutes of Exercise per Session: Not on file  Stress:   . Feeling of Stress : Not on file  Social Connections:   . Frequency of Communication with Friends and Family: Not on file  . Frequency of Social Gatherings with Friends and Family: Not on file  . Attends Religious Services: Not on file  . Active Member of Clubs or Organizations: Not on file  . Attends Archivist Meetings: Not on file  . Marital Status: Not on file    Outpatient Encounter Medications as of 12/09/2019  Medication Sig  . Ascorbic Acid (VITAMIN C PO) Take 1 tablet by mouth daily. 1000 mg  . atorvastatin (LIPITOR) 20 MG tablet TAKE 1 TABLET BY MOUTH EVERY DAY  . L-Lysine 1000 MG TABS Take 500 mg by mouth daily.   Marland Kitchen lisinopril (ZESTRIL) 20 MG tablet TAKE 1 TABLET BY MOUTH EVERY DAY  . meclizine (ANTIVERT) 25 MG tablet Take 1 tablet (25 mg total) by mouth every 6 (six) hours as needed for dizziness.  . Multiple Vitamins-Minerals (PRESERVISION AREDS 2) CAPS Take 1 capsule by mouth 2 (two) times daily before lunch and supper.  . [DISCONTINUED] meloxicam (MOBIC) 15 MG tablet Take 1 tablet (15 mg total) by mouth daily. Take X 2 weeks then take PRN   No  facility-administered encounter medications on file as of 12/09/2019.    Activities of Daily Living In your present state of health, do you have any difficulty performing the following activities: 12/09/2019  Hearing? N  Vision? N  Difficulty concentrating or making decisions? N  Walking or climbing stairs? N  Dressing or bathing? N  Doing errands, shopping? N  Preparing Food and eating ? N  Using the Toilet? N  In the past six months, have you accidently leaked urine? N  Do you have problems with loss of bowel control? N  Managing your  Medications? N  Managing your Finances? N  Housekeeping or managing your Housekeeping? N  Some recent data might be hidden    Patient Care Team: [name], MD as PCP - General (Family Medicine) [place], Heather, Albert as Consulting Physician (Optometry) Harriett Sine, MD as Consulting Physician (Dermatology) Princess Bruins, MD as Consulting Physician (Ophthalmology)   Assessment:   This is a routine wellness examination for Teandre.  Exercise Activities and Dietary recommendations Current Exercise Habits: Home exercise routine, Type of exercise: walking;Other - see comments(yardwork activities), Time (Minutes): 30, Frequency (Times/Week): 4, Weekly Exercise (Minutes/Week): 120, Intensity: Mild  Goals    . Weight (lb) < 200 lb (90.7 kg)     Patient has a goal of getting to 200lb for his physical this year       Fall Risk Fall Risk  12/09/2019 11/25/2019 12/05/2018 10/25/2018 12/19/2017  Falls in the past year? 1 1 1 1  Yes  Comment - Pt says that he tripped over a place in his driveway. [place] in the snow a year ago -  Number falls in past yr: 0 - 0 0 1  Comment - - tripped on uneven surface in driveway - -  Injury with Fall? 1 1 1  0 No  Comment - scrapes and bruises Scraped face and hands in fall - -  Risk for fall due to : History of fall(s) - - - -  Follow up Falls evaluation completed;Education provided;Falls prevention discussed - - - -     Is the patient's home free of loose throw rugs in walkways, pet beds, electrical cords, etc?   yes      Grab bars in the bathroom? yes      Handrails on the stairs?   yes      Adequate lighting?   yes  Depression Screen PHQ 2/9 Scores 12/09/2019 12/05/2018 10/25/2018 12/19/2017  PHQ - 2 Score 0 0 0 0    Cognitive Function MMSE - Mini Mental State Exam 12/05/2018  Orientation to time 4  Orientation to Place 5  Registration 3  Attention/ Calculation 5  Recall 3  Language- name 2 objects 2  Language- repeat 1  Language- follow 3 step command 3  Language- read & follow direction 1  Write a sentence 1  Copy design 1  Total score 29     6CIT Screen 12/09/2019  What Year? 0 points  What month? 0 points  What time? 0 points  Count back from 20 0 points  Months in reverse 0 points  Repeat phrase 0 points  Total Score 0    Immunization History  Administered Date(s) Administered  . Fluad Quad(high Dose 65+) 08/27/2019  . Influenza Split 10/03/2011  . Influenza Whole 08/29/2012  . Influenza, High Dose Seasonal PF 08/17/2015, 08/17/2015, 09/01/2016, 08/22/2018  . Influenza,inj,Quad PF,6+ Mos 09/05/2013, 09/08/2014  . Influenza-Unspecified 08/17/2017, 08/28/2018  . Pneumococcal Conjugate-13 10/08/2013  . Pneumococcal Polysaccharide-23 10/02/2009, 02/04/2015  . Td 10/02/2009  . Tdap 11/26/2019  . Zoster 12/04/2009  . Zoster Recombinat (Shingrix) 12/28/2018, 10/05/2019    Qualifies for Shingles Vaccine? Shingrix completed   Screening Tests Health Maintenance  Topic Date Due  . COLONOSCOPY  05/21/2025  . TETANUS/TDAP  11/25/2029  . INFLUENZA VACCINE  Completed  . Hepatitis C Screening  Completed  . PNA vac Low Risk Adult  Completed   Cancer Screenings: Lung: Low Dose CT Chest recommended if Age 56-80 years, 30 pack-year currently smoking OR have quit w/in 15years. Patient does not  qualify. Colorectal: colonoscopy 05/22/15; repeat in 10 years      Plan:  I have  personally reviewed and addressed the Medicare Annual Wellness questionnaire and have noted the following in the patient's chart:  A. Medical and social history B. Use of alcohol, tobacco or illicit drugs  C. Current medications and supplements D. Functional ability and status E.  Nutritional status F.  Physical activity G. Advance directives H. List of other physicians I.  Hospitalizations, surgeries, and ER visits in previous 12 months [place] such as hearing and vision if needed, cognitive and depression L. Referrals, records requested, and appointments- none   In addition, I have reviewed and discussed with patient certain preventive protocols, quality metrics, and best practice recommendations. A written personalized care plan for preventive services as well as general preventive health recommendations were provided to patient.   Signed,  Denman George, LPN  Nurse Health Advisor   Nurse Notes: no additional

## 2019-12-11 ENCOUNTER — Ambulatory Visit: Payer: PPO

## 2019-12-24 DIAGNOSIS — H353211 Exudative age-related macular degeneration, right eye, with active choroidal neovascularization: Secondary | ICD-10-CM | POA: Diagnosis not present

## 2019-12-24 DIAGNOSIS — H353122 Nonexudative age-related macular degeneration, left eye, intermediate dry stage: Secondary | ICD-10-CM | POA: Diagnosis not present

## 2020-01-21 DIAGNOSIS — H353211 Exudative age-related macular degeneration, right eye, with active choroidal neovascularization: Secondary | ICD-10-CM | POA: Diagnosis not present

## 2020-01-21 DIAGNOSIS — H353122 Nonexudative age-related macular degeneration, left eye, intermediate dry stage: Secondary | ICD-10-CM | POA: Diagnosis not present

## 2020-01-28 ENCOUNTER — Encounter: Payer: Self-pay | Admitting: Family Medicine

## 2020-01-28 NOTE — Telephone Encounter (Signed)
Immunization updated.

## 2020-03-03 DIAGNOSIS — H353211 Exudative age-related macular degeneration, right eye, with active choroidal neovascularization: Secondary | ICD-10-CM | POA: Diagnosis not present

## 2020-03-03 DIAGNOSIS — H35033 Hypertensive retinopathy, bilateral: Secondary | ICD-10-CM | POA: Diagnosis not present

## 2020-03-03 DIAGNOSIS — H353122 Nonexudative age-related macular degeneration, left eye, intermediate dry stage: Secondary | ICD-10-CM | POA: Diagnosis not present

## 2020-03-24 ENCOUNTER — Other Ambulatory Visit: Payer: Self-pay | Admitting: Family Medicine

## 2020-04-08 DIAGNOSIS — L814 Other melanin hyperpigmentation: Secondary | ICD-10-CM | POA: Diagnosis not present

## 2020-04-08 DIAGNOSIS — D2262 Melanocytic nevi of left upper limb, including shoulder: Secondary | ICD-10-CM | POA: Diagnosis not present

## 2020-04-08 DIAGNOSIS — D1801 Hemangioma of skin and subcutaneous tissue: Secondary | ICD-10-CM | POA: Diagnosis not present

## 2020-04-08 DIAGNOSIS — L821 Other seborrheic keratosis: Secondary | ICD-10-CM | POA: Diagnosis not present

## 2020-04-08 DIAGNOSIS — D225 Melanocytic nevi of trunk: Secondary | ICD-10-CM | POA: Diagnosis not present

## 2020-04-08 DIAGNOSIS — L400 Psoriasis vulgaris: Secondary | ICD-10-CM | POA: Diagnosis not present

## 2020-04-08 DIAGNOSIS — L570 Actinic keratosis: Secondary | ICD-10-CM | POA: Diagnosis not present

## 2020-04-28 DIAGNOSIS — H353211 Exudative age-related macular degeneration, right eye, with active choroidal neovascularization: Secondary | ICD-10-CM | POA: Diagnosis not present

## 2020-04-28 DIAGNOSIS — H353122 Nonexudative age-related macular degeneration, left eye, intermediate dry stage: Secondary | ICD-10-CM | POA: Diagnosis not present

## 2020-05-04 ENCOUNTER — Other Ambulatory Visit: Payer: Self-pay | Admitting: Family Medicine

## 2020-05-29 ENCOUNTER — Ambulatory Visit (INDEPENDENT_AMBULATORY_CARE_PROVIDER_SITE_OTHER): Payer: PPO | Admitting: Family Medicine

## 2020-05-29 ENCOUNTER — Other Ambulatory Visit: Payer: Self-pay

## 2020-05-29 ENCOUNTER — Ambulatory Visit: Payer: PPO | Admitting: Family Medicine

## 2020-05-29 VITALS — BP 127/80 | HR 62 | Temp 97.7°F | Ht 69.0 in | Wt 185.4 lb

## 2020-05-29 DIAGNOSIS — I10 Essential (primary) hypertension: Secondary | ICD-10-CM

## 2020-05-29 DIAGNOSIS — M1000 Idiopathic gout, unspecified site: Secondary | ICD-10-CM

## 2020-05-29 DIAGNOSIS — E785 Hyperlipidemia, unspecified: Secondary | ICD-10-CM | POA: Diagnosis not present

## 2020-05-29 DIAGNOSIS — I714 Abdominal aortic aneurysm, without rupture, unspecified: Secondary | ICD-10-CM

## 2020-05-29 LAB — CBC WITH DIFFERENTIAL/PLATELET
Basophils Absolute: 0 10*3/uL (ref 0.0–0.1)
Basophils Relative: 0.5 % (ref 0.0–3.0)
Eosinophils Absolute: 0.1 10*3/uL (ref 0.0–0.7)
Eosinophils Relative: 1.4 % (ref 0.0–5.0)
HCT: 39.3 % (ref 39.0–52.0)
Hemoglobin: 13.6 g/dL (ref 13.0–17.0)
Lymphocytes Relative: 18.6 % (ref 12.0–46.0)
Lymphs Abs: 0.8 10*3/uL (ref 0.7–4.0)
MCHC: 34.7 g/dL (ref 30.0–36.0)
MCV: 86.2 fl (ref 78.0–100.0)
Monocytes Absolute: 0.4 10*3/uL (ref 0.1–1.0)
Monocytes Relative: 10.4 % (ref 3.0–12.0)
Neutro Abs: 2.9 10*3/uL (ref 1.4–7.7)
Neutrophils Relative %: 69.1 % (ref 43.0–77.0)
Platelets: 123 10*3/uL — ABNORMAL LOW (ref 150.0–400.0)
RBC: 4.56 Mil/uL (ref 4.22–5.81)
RDW: 14.1 % (ref 11.5–15.5)
WBC: 4.2 10*3/uL (ref 4.0–10.5)

## 2020-05-29 LAB — COMPREHENSIVE METABOLIC PANEL
ALT: 11 U/L (ref 0–53)
AST: 17 U/L (ref 0–37)
Albumin: 4.4 g/dL (ref 3.5–5.2)
Alkaline Phosphatase: 70 U/L (ref 39–117)
BUN: 17 mg/dL (ref 6–23)
CO2: 29 mEq/L (ref 19–32)
Calcium: 9 mg/dL (ref 8.4–10.5)
Chloride: 104 mEq/L (ref 96–112)
Creatinine, Ser: 0.93 mg/dL (ref 0.40–1.50)
GFR: 80.08 mL/min (ref >60.00)
Glucose, Bld: 90 mg/dL (ref 70–99)
Potassium: 4.1 mEq/L (ref 3.5–5.1)
Sodium: 139 mEq/L (ref 135–145)
Total Bilirubin: 1.2 mg/dL (ref 0.2–1.2)
Total Protein: 6 g/dL (ref 6.0–8.3)

## 2020-05-29 LAB — URIC ACID: Uric Acid, Serum: 6.1 mg/dL (ref 4.0–7.8)

## 2020-05-29 NOTE — Progress Notes (Signed)
Phone [phone] In person visit   Subjective:   Edward Goodwin is a 69 y.o. year old very pleasant male patient who presents for/with See problem oriented charting Chief Complaint  Patient presents with  . Hypertension  . Leg Pain    rt leg x 2 weeks with bending to pick up golf ball.     This visit occurred during the SARS-CoV-2 public health emergency.  Safety protocols were in place, including screening questions prior to the visit, additional usage of staff PPE, and extensive cleaning of exam room while observing appropriate contact time as indicated for disinfecting solutions.   Past Medical History-  Patient Active Problem List   Diagnosis Date Noted  . AAA (abdominal aortic aneurysm) ([place]) 05/31/2018    Priority: High  . Ulcerative colitis ([place]) 08/20/2007    Priority: High  . Former smoker 04/19/2018    Priority: Medium  . Hyperlipidemia 04/19/2018    Priority: Medium  . Vertigo 12/19/2017    Priority: Medium  . Gout 03/24/2014    Priority: Medium  . Elevated PSA, less than 10 ng/ml 09/09/2013    Priority: Medium  . Macular degeneration, bilateral 07/15/2012    Priority: Medium  . Obesity (BMI 30.0-34.9) 12/02/2008    Priority: Medium  . Essential hypertension 08/20/2007    Priority: Medium  . PSORIASIS 08/20/2007    Priority: Medium  . Onychomycosis 12/19/2017    Priority: Low  . Trochanteric bursitis of left hip 03/31/2015    Priority: Low    Medications- reviewed and updated Current Outpatient Medications  Medication Sig Dispense Refill  . Ascorbic Acid (VITAMIN C PO) Take 1 tablet by mouth daily. 1000 mg    . atorvastatin (LIPITOR) 20 MG tablet TAKE 1 TABLET BY MOUTH EVERY DAY 90 tablet 3  . L-Lysine 1000 MG TABS Take 500 mg by mouth daily.     Marland Kitchen lisinopril (ZESTRIL) 20 MG tablet TAKE 1 TABLET BY MOUTH EVERY DAY 90 tablet 1  . meclizine (ANTIVERT) 25 MG tablet Take 1 tablet (25 mg total) by mouth every 6 (six) hours as needed for dizziness. 60  tablet 2  . Multiple Vitamins-Minerals (PRESERVISION AREDS 2) CAPS Take 1 capsule by mouth 2 (two) times daily before lunch and supper.    . clobetasol ointment (TEMOVATE) 0.05 % Apply topically 2 (two) times daily as needed.     No current facility-administered medications for this visit.     Objective:  BP 127/80   Pulse 62   Temp 97.7 F (36.5 C) (Temporal)   Ht 5' 9"  (1.753 m)   Wt 185 lb 6.4 oz (84.1 kg)   SpO2 97%   BMI 27.38 kg/m  Gen: NAD, resting comfortably CV: RRR no murmurs rubs or gallops Lungs: CTAB no crackles, wheeze, rhonchi Ext: no edema Skin: warm, dry Msk: Slight bruising on lower portion of right upper leg.  Pain with palpation over lower portion of hamstring-worse with flexion of the lower leg.  Good range of motion.  Symptoms worsen with bending over at the waist    Assessment and Plan   # Right hamstring pain S:patient was playing golf and bent over and he thinks he felt something pop in right hamstring- did ok and then went back out on Monday of this week. Had some pain right in the hamstring and then went to pick up ball and felt worsening pain and has a bruise. Hurts worse if bends at the waist A/P: this looks like a hamstring strain/partial  tear with bruising noted.  We will try ice for 3 days and then heat for another 4 days.  We will use a home exercise regimen 3 times a week for a month and then once a week after that.  If not improving in the next 2 to 4 weeks would recommend referral to sports medicine-he can send [place] a message via MyChart and we can do a referral. Will avoid any exercise or position that increases pain more than 1-2/10  #hypertension S: medication: Lisinopril 20 mg Home readings #s: anywhere from 100s/60s up to 157/82 (one time reading). Most readings probably in 120s or 130s/70s.  BP Readings from Last 3 Encounters:  05/29/20 127/80  11/25/19 136/68  01/31/19 122/68  A/P: Stable. Continue current medications.    #hyperlipidemia S: Medication:Atorvastatin 20 mg Lab Results  Component Value Date   CHOL 111 11/20/2019   HDL 43.90 11/20/2019   LDLCALC 54 11/20/2019   TRIG 65.0 11/20/2019   CHOLHDL 3 11/20/2019   A/P: excellent control- continue current meds  #Gout S:   Patient reports cutting down bacon (rationed) has helped him not have recurrent flare despite prior high uric acid and not being on medication. Over a year since flare Lab Results  Component Value Date   LABURIC 7.3 03/24/2014  A/P:no recent flares and unlikely to start medication but we agreed to get a uric acid level just update this  # Aortic dilation S: Previously listed as aortic aneurysm-on repeat ultrasound earlier this year no measurements over 3 cm A/P: I do not have a code for aortic dilation- we will list under aneurysm and consider repeat in 3-5 years to make sure stable/not progressive   #Thrombocytopenia-mild chronic issue but other cell lines normal.  Update CBC with labs today  #Overweight   S: max weight 289 prior practice. Started 271 before starting weight loss. Tried to give something up for lent - cut out snacking.   Wt Readings from Last 3 Encounters:  05/29/20 185 lb 6.4 oz (84.1 kg)  11/25/19 190 lb 3.2 oz (86.3 kg)  01/31/19 211 lb 6.4 oz (95.9 kg)  A/P: phenomenal job with lifestyle changes- encouraged him to keep up current efforts. His goal is to hold in 185-190.   Recommended follow up:  Keep January physical  Future Appointments  Date Time Provider [place]  12/03/2020  8:20 AM [name], MD [facility]    Lab/Order associations:   ICD-10-CM   1. Essential hypertension  I10 CBC with Differential/Platelet    Comprehensive metabolic panel  2. Hyperlipidemia, unspecified hyperlipidemia type  E78.5   3. Idiopathic gout, unspecified chronicity, unspecified site  M10.00   4. Abdominal aortic aneurysm (AAA) without rupture (HCC)  I71.4    Return precautions advised.   Garret Reddish, MD

## 2020-05-29 NOTE — Patient Instructions (Addendum)
   Please stop by lab before you go If you have mychart- we will send your results within 3 business days of [place] receiving them.  If you do not have mychart- we will call you about results within 5 business days of [place] receiving them.    Very happy with your blood pressures. Thank you so much for documenting them for [place]. Please continue to do this in the future.    We may repeat your scan on your aneurysm to keep an eye on it in 3-5 years but looked good in your last scan.   You are doing a amazing with your weight loss. You are continuing to do well.     Leg Pain/Ham string strain:  I would avoid any movements that may irritate it. We have given you some exercises to try 3 times a week for a month. If any of the movements increase your pain do not continue them. You can try ice for 3 days and heat for 4 days after to help with pain. If continues to bother you after two weeks or becomes more painful after two weeks let our office know and we will set you up with sports medicine.   Avoid any movements that increase the pain more than a 2 out of 10.   We will see you at your physical or sooner if needed.   Recommended follow up: Return for CPE .

## 2020-05-29 NOTE — Assessment & Plan Note (Signed)
#   Aortic dilation S: Previously listed as aortic aneurysm-on repeat ultrasound earlier this year no measurements over 3 cm A/P: I do not have a code for aortic dilation- we will list under aneurysm and consider repeat in 3-5 years to make sure stable/not progressive

## 2020-06-24 ENCOUNTER — Telehealth: Payer: Self-pay | Admitting: Family Medicine

## 2020-06-24 NOTE — Progress Notes (Signed)
  Chronic Care Management   Note  06/24/2020 Name: Edward Goodwin MRN: [id number] DOB: [birth year]  Edward Goodwin is a 69 y.o. year old male who is a primary care patient of Yong Channel, Brayton Mars, MD. I reached out to Edward Goodwin by phone today in response to a referral sent by [name]'s PCP, [name], MD.   [name] was given information about Chronic Care Management services today including:  1. CCM service includes personalized support from designated clinical staff supervised by his physician, including individualized plan of care and coordination with other care providers 2. 24/7 contact phone numbers for assistance for urgent and routine care needs. 3. Service will only be billed when office clinical staff spend 20 minutes or more in a month to coordinate care. 4. Only one practitioner may furnish and bill the service in a calendar month. 5. The patient may stop CCM services at any time (effective at the end of the month) by phone call to the office staff.   Patient agreed to services and verbal consent obtained.   Follow up plan:   Earney [place] Upstream Scheduler

## 2020-07-07 DIAGNOSIS — H353211 Exudative age-related macular degeneration, right eye, with active choroidal neovascularization: Secondary | ICD-10-CM | POA: Diagnosis not present

## 2020-08-25 ENCOUNTER — Other Ambulatory Visit: Payer: Self-pay

## 2020-08-25 ENCOUNTER — Telehealth: Payer: Self-pay

## 2020-08-25 DIAGNOSIS — I10 Essential (primary) hypertension: Secondary | ICD-10-CM

## 2020-08-25 DIAGNOSIS — E785 Hyperlipidemia, unspecified: Secondary | ICD-10-CM

## 2020-08-25 NOTE — Progress Notes (Signed)
° ° ° °  Chronic Care Management Pharmacy Assistant   Name: Edward Goodwin  MRN: [id number] DOB: Jul 30, 1951  Reason for Encounter: Initial Visit  Patient Questions:  1.  Have you seen any other providers since your last visit? Yes, Patient stated he sees Retina opthamolgy speacilaist due to having age related macular degeneration.  2.  Any changes in your medicines or health? No   Edward Goodwin,  69 y.o. , male presents for their Initial CCM visit with the clinical pharmacist via telephone.  PCP : [name], MD  Allergies:   Allergies  Allergen Reactions   Sulfonamide Derivatives     "have just been told that my entire life"    Medications: Outpatient Encounter Medications as of 08/25/2020  Medication Sig   Ascorbic Acid (VITAMIN C PO) Take 1 tablet by mouth daily. 1000 mg   atorvastatin (LIPITOR) 20 MG tablet TAKE 1 TABLET BY MOUTH EVERY DAY   clobetasol ointment (TEMOVATE) 0.05 % Apply topically 2 (two) times daily as needed.   L-Lysine 1000 MG TABS Take 500 mg by mouth daily.    lisinopril (ZESTRIL) 20 MG tablet TAKE 1 TABLET BY MOUTH EVERY DAY   meclizine (ANTIVERT) 25 MG tablet Take 1 tablet (25 mg total) by mouth every 6 (six) hours as needed for dizziness.   Multiple Vitamins-Minerals (PRESERVISION AREDS 2) CAPS Take 1 capsule by mouth 2 (two) times daily before lunch and supper.   No facility-administered encounter medications on file as of 08/25/2020.    Current Diagnosis: Patient Active Problem List   Diagnosis Date Noted   AAA (abdominal aortic aneurysm) ([place]) 05/31/2018   Former smoker 04/19/2018   Hyperlipidemia 04/19/2018   Vertigo 12/19/2017   Onychomycosis 12/19/2017   Trochanteric bursitis of left hip 03/31/2015   Gout 03/24/2014   Elevated PSA, less than 10 ng/ml 09/09/2013   Macular degeneration, bilateral 07/15/2012   Obesity (BMI 30.0-34.9) 12/02/2008   Essential hypertension 08/20/2007   Ulcerative colitis ([place])  08/20/2007   PSORIASIS 08/20/2007      Have you seen any other providers since your last visit? Yes, Patient stated he sees Retina opthamolgy speacilaist due to having age related macular degeneration. Any changes in your medications or health? Patient stated there are no changes in medication.  Any side effects from any medications? Patient stated he does not have any side effects. Do you have any symptoms or problems not managed by your medications? Patient states he has no problems not managed by medications. Any concerns about your health right now? Patient states not at this moment Has your provider asked that you check blood pressure, blood sugar, or follow special diet at home? Patient stated he check BP at home but does not follow a specific diet.  Do you get any type of exercise on a regular basis? Patient stated he does yard work and goes walking with his wife.  Can you think of a goal you would like to reach for your health? Patient states he can not think of a goal however he would like to stay alive. Do you have any problems getting your medications? Patient states he has no problems getting medications. Is there anything that you would like to discuss during the appointment? Patient stated not at the moment.  Please bring medications and supplements to appointment   Georgiana Shore ,[facility] Clinical Pharmacist Assistant [phone]   Follow-Up:  Pharmacist Review

## 2020-08-26 ENCOUNTER — Ambulatory Visit: Payer: PPO

## 2020-08-26 ENCOUNTER — Other Ambulatory Visit: Payer: Self-pay

## 2020-08-26 DIAGNOSIS — M1000 Idiopathic gout, unspecified site: Secondary | ICD-10-CM

## 2020-08-26 DIAGNOSIS — E785 Hyperlipidemia, unspecified: Secondary | ICD-10-CM

## 2020-08-26 DIAGNOSIS — I10 Essential (primary) hypertension: Secondary | ICD-10-CM

## 2020-08-26 NOTE — Chronic Care Management (AMB) (Signed)
  Chronic Care Management   Outreach Note   Name: Edward Goodwin MRN: [id number] DOB: 1951/07/26  Referred by: [name], MD Reason for referral: Telephone Appointment with [place] Pharmacist, Madelin Rear.   An unsuccessful telephone outreach was attempted today. The patient was referred to the pharmacist for assistance with care management and care coordination.   Telephone appointment with clinical pharmacist today (08/26/2020) at 2pm. If patient immediately returns call, transfer to [phone]. Otherwise, please provide this number so patient can reschedule visit.   Madelin Rear, Pharm.D., BCGP Clinical Pharmacist [facility] [phone]

## 2020-08-26 NOTE — Patient Instructions (Addendum)
Please review care plan below and call me at [phone] (direct line) with any questions!  Thank you, Edyth Gunnels., Clinical Pharmacist  Goals Addressed            This Visit's Progress   . PharmD Care Plan       CARE PLAN ENTRY (see longitudinal plan of care for additional care plan information)  Current Barriers:  . Chronic Disease Management support, education, and care coordination needs related to Hypertension, Hyperlipidemia, and Gout   Hypertension BP Readings from Last 3 Encounters:  05/29/20 127/80  11/25/19 136/68  01/31/19 122/68   . Pharmacist Clinical Goal(s): o Over the next 180 days, patient will work with PharmD and providers to maintain BP goal <130/80 . Current regimen:  o Lisinopril 20 mg once daily . Interventions: o Diet/exercise recommendations . Patient self care activities - Over the next 180 days, patient will: o Check BP at elast once every 1-2 weeks, document, and provide at future appointments o Ensure daily salt intake < 1800 mg/day  Hyperlipidemia Lab Results  Component Value Date/Time   LDLCALC 54 11/20/2019 08:14 AM   . Pharmacist Clinical Goal(s): o Over the next 180 days, patient will work with PharmD and providers to maintain LDL goal < 70 . Current regimen:  o Atorvastatin 20 mg once daily . Interventions: o Diet / exercise recommendations . Patient self care activities - Over the next 180 days, patient will: o Continue current management Gout . Pharmacist Clinical Goal(s) o Over the next 180 days, patient will work with PharmD and providers to minimize symptoms of gout . Current regimen:  o N/a . Interventions: o Discussed low purine diet . Patient self care activities - Over the next 180 days, patient will: o Continue current management  Medication management . Pharmacist Clinical Goal(s): o Over the next 180 days, patient will work with PharmD and providers to maintain optimal medication adherence . [facility] . Interventions o Comprehensive medication review performed. o Continue current medication management strategy . Patient self care activities - Over the next 180 days, patient will: o Take medications as prescribed o Report any questions or concerns to PharmD and/or provider(s) Initial goal documentation.      [name] was given information about Chronic Care Management services today including:  1. CCM service includes personalized support from designated clinical staff supervised by his physician, including individualized plan of care and coordination with other care providers 2. 24/7 contact phone numbers for assistance for urgent and routine care needs. 3. Standard insurance, coinsurance, copays and deductibles apply for chronic care management only during months in which we provide at least 20 minutes of these services. Most insurances cover these services at 100%, however patients may be responsible for any copay, coinsurance and/or deductible if applicable. This service may help you avoid the need for more expensive face-to-face services. 4. Only one practitioner may furnish and bill the service in a calendar month. 5. The patient may stop CCM services at any time (effective at the end of the month) by phone call to the office staff.  Patient agreed to services and verbal consent obtained.   The patient verbalized understanding of instructions provided today and agreed to receive a mailed copy of patient instruction and/or educational materials. Telephone follow up appointment with pharmacy team member scheduled for: See next appointment with "Care Management Staff" under "What's Next" below.   Madelin Rear, Pharm.D., BCGP Clinical Pharmacist [facility] [phone]  Low-Purine Eating  Plan A low-purine eating plan involves making food choices to limit your intake of purine. Purine is a kind of uric acid. Too much uric acid in your blood can cause certain conditions,  such as gout and kidney stones. Eating a low-purine diet can help control these conditions. What are tips for following this plan? Reading food labels   Avoid foods with saturated or Trans fat.  Check the ingredient list of grains-based foods, such as bread and cereal, to make sure that they contain whole grains.  Check the ingredient list of sauces or soups to make sure they do not contain meat or fish.  When choosing soft drinks, check the ingredient list to make sure they do not contain high-fructose corn syrup. Shopping  Buy plenty of fresh fruits and vegetables.  Avoid buying canned or fresh fish.  Buy dairy products labeled as low-fat or nonfat.  Avoid buying premade or processed foods. These foods are often high in fat, salt (sodium), and added sugar. Cooking  Use olive oil instead of butter when cooking. Oils like olive oil, canola oil, and sunflower oil contain healthy fats. Meal planning  Learn which foods do or do not affect you. If you find out that a food tends to cause your gout symptoms to flare up, avoid eating that food. You can enjoy foods that do not cause problems. If you have any questions about a food item, talk with your dietitian or health care provider.  Limit foods high in fat, especially saturated fat. Fat makes it harder for your body to get rid of uric acid.  Choose foods that are lower in fat and are lean sources of protein. General guidelines  Limit alcohol intake to no more than 1 drink a day for nonpregnant women and 2 drinks a day for men. One drink equals 12 oz of beer, 5 oz of wine, or 1 oz of hard liquor. Alcohol can affect the way your body gets rid of uric acid.  Drink plenty of water to keep your urine clear or pale yellow. Fluids can help remove uric acid from your body.  If directed by your health care provider, take a vitamin C supplement.  Work with your health care provider and dietitian to develop a plan to achieve or maintain a  healthy weight. Losing weight can help reduce uric acid in your blood. What foods are recommended? The items listed may not be a complete list. Talk with your dietitian about what dietary choices are best for you. Foods low in purines Foods low in purines do not need to be limited. These include:  All fruits.  All low-purine vegetables, pickles, and olives.  Breads, pasta, rice, cornbread, and popcorn. Cake and other baked goods.  All dairy foods.  Eggs, nuts, and nut butters.  Spices and condiments, such as salt, herbs, and vinegar.  Plant oils, butter, and margarine.  Water, sugar-free soft drinks, tea, coffee, and cocoa.  Vegetable-based soups, broths, sauces, and gravies. Foods moderate in purines Foods moderate in purines should be limited to the amounts listed.   cup of asparagus, cauliflower, spinach, mushrooms, or green peas, each day.  2/3 cup uncooked oatmeal, each day.   cup dry wheat bran or wheat germ, each day.  2-3 ounces of meat or poultry, each day.  4-6 ounces of shellfish, such as crab, lobster, oysters, or shrimp, each day.  1 cup cooked beans, peas, or lentils, each day.  Soup, broths, or bouillon made from meat or fish.  Limit these foods as much as possible. What foods are not recommended? The items listed may not be a complete list. Talk with your dietitian about what dietary choices are best for you. Limit your intake of foods high in purines, including:  Beer and other alcohol.  Meat-based gravy or sauce.  Canned or fresh fish, such as: ? Anchovies, sardines, herring, and tuna. ? Mussels and scallops. ? Codfish, trout, and haddock.  Berniece Salines.  Organ meats, such as: ? Liver or kidney. ? Tripe. ? Sweetbreads (thymus gland or pancreas).  Wild Clinical biochemist.  Yeast or yeast extract supplements.  Drinks sweetened with high-fructose corn syrup. Summary  Eating a low-purine diet can help control conditions caused by too much uric acid  in the body, such as gout or kidney stones.  Choose low-purine foods, limit alcohol, and limit foods high in fat.  You will learn over time which foods do or do not affect you. If you find out that a food tends to cause your gout symptoms to flare up, avoid eating that food. This information is not intended to replace advice given to you by your health care provider. Make sure you discuss any questions you have with your health care provider. Document Revised: 10/13/2017 Document Reviewed: 12/14/2016 Elsevier Patient Education  2020 [employer].

## 2020-08-26 NOTE — Progress Notes (Signed)
Chronic Care Management Pharmacy  Name: Edward Goodwin  MRN: [id number] DOB: 1950/11/27  Chief Complaint/ HPI  Edward Goodwin,  69 y.o., male presents for their Initial CCM visit with the clinical pharmacist via telephone due to COVID-19 Pandemic.  PCP : [name], MD  Chronic conditions include:  Encounter Diagnoses  Name Primary?  . Essential hypertension Yes  . Hyperlipidemia, unspecified hyperlipidemia type   . Idiopathic gout, unspecified chronicity, unspecified site     Office Visits:  05/29/2020 (PCP): right hamstring pain. If no improvement referral to SM in 2-4 weeks. HTN/HLD controlled, no major issues with gout - cut down on bacon.  Patient Active Problem List   Diagnosis Date Noted  . AAA (abdominal aortic aneurysm) ([place]) 05/31/2018  . Former smoker 04/19/2018  . Hyperlipidemia 04/19/2018  . Vertigo 12/19/2017  . Onychomycosis 12/19/2017  . Trochanteric bursitis of left hip 03/31/2015  . Gout 03/24/2014  . Elevated PSA, less than 10 ng/ml 09/09/2013  . Macular degeneration, bilateral 07/15/2012  . Obesity (BMI 30.0-34.9) 12/02/2008  . Essential hypertension 08/20/2007  . Ulcerative colitis ([place]) 08/20/2007  . PSORIASIS 08/20/2007   Past Surgical History:  Procedure Laterality Date  . ACNE CYST REMOVAL    . CATARACT EXTRACTION W/ INTRAOCULAR LENS IMPLANT  10/2014   bilat  . COLONOSCOPY    . FOOT SURGERY  1988  . SHOULDER ARTHROSCOPY  2007   Right  . TONSILLECTOMY     Family History  Problem Relation Age of Onset  . Heart disease Mother   . Breast cancer Mother   . Hypertension Mother   . Stroke Mother   . Hypothyroidism Mother   . Dementia Mother   . Heart disease Father        CAD/MI 45s or 43s smoker. led to death age 67.   Marland Kitchen Hypertension Father   . Diabetes Daughter   . Diabetes Maternal Uncle   . Hepatitis C Brother   . Stroke Maternal Grandmother   . Emphysema Maternal Grandfather   . Arthritis Paternal Grandmother   . Colon  cancer Neg Hx   . Esophageal cancer Neg Hx   . Stomach cancer Neg Hx   . Rectal cancer Neg Hx    Allergies  Allergen Reactions  . Sulfonamide Derivatives     "have just been told that my entire life"   Outpatient Encounter Medications as of 08/26/2020  Medication Sig  . Ascorbic Acid (VITAMIN C PO) Take 1 tablet by mouth daily. 1000 mg  . atorvastatin (LIPITOR) 20 MG tablet TAKE 1 TABLET BY MOUTH EVERY DAY  . clobetasol ointment (TEMOVATE) 0.05 % Apply topically 2 (two) times daily as needed.  Marland Kitchen L-Lysine 1000 MG TABS Take 500 mg by mouth daily.   Marland Kitchen lisinopril (ZESTRIL) 20 MG tablet TAKE 1 TABLET BY MOUTH EVERY DAY  . meclizine (ANTIVERT) 25 MG tablet Take 1 tablet (25 mg total) by mouth every 6 (six) hours as needed for dizziness.  . Multiple Vitamins-Minerals (PRESERVISION AREDS 2) CAPS Take 1 capsule by mouth 2 (two) times daily before lunch and supper.   No facility-administered encounter medications on file as of 08/26/2020.   Patient Care Team    Relationship Specialty Notifications Start End  [name], MD PCP - General Family Medicine  04/19/18   [place], Heather, [place] Physician Optometry  12/09/19   Harriett Sine, MD Consulting Physician Dermatology  12/09/19   Princess Bruins, MD Consulting Physician Ophthalmology  12/09/19  Madelin Rear, [facility] Pharmacist Pharmacist  06/24/20    Comment: [phone]   Current Diagnosis/Assessment: Goals Addressed            This Visit's Progress   . PharmD Care Plan       CARE PLAN ENTRY (see longitudinal plan of care for additional care plan information)  Current Barriers:  . Chronic Disease Management support, education, and care coordination needs related to Hypertension, Hyperlipidemia, and Gout   Hypertension BP Readings from Last 3 Encounters:  05/29/20 127/80  11/25/19 136/68  01/31/19 122/68   . Pharmacist Clinical Goal(s): o Over the next 180 days, patient will work with PharmD and providers to maintain  BP goal <130/80 . Current regimen:  o Lisinopril 20 mg once daily . Interventions: o Diet/exercise recommendations . Patient self care activities - Over the next 180 days, patient will: o Check BP at elast once every 1-2 weeks, document, and provide at future appointments o Ensure daily salt intake < 1800 mg/day  Hyperlipidemia Lab Results  Component Value Date/Time   LDLCALC 54 11/20/2019 08:14 AM   . Pharmacist Clinical Goal(s): o Over the next 180 days, patient will work with PharmD and providers to maintain LDL goal < 70 . Current regimen:  o Atorvastatin 20 mg once daily . Interventions: o Diet / exercise recommendations . Patient self care activities - Over the next 180 days, patient will: o Continue current management Gout . Pharmacist Clinical Goal(s) o Over the next 180 days, patient will work with PharmD and providers to minimize symptoms of gout . Current regimen:  o N/a . Interventions: o Discussed low purine diet . Patient self care activities - Over the next 180 days, patient will: o Continue current management  Medication management . Pharmacist Clinical Goal(s): o Over the next 180 days, patient will work with PharmD and providers to maintain optimal medication adherence . [facility] . Interventions o Comprehensive medication review performed. o Continue current medication management strategy . Patient self care activities - Over the next 180 days, patient will: o Take medications as prescribed o Report any questions or concerns to PharmD and/or provider(s) Initial goal documentation.      Hypertension   BP goal <130/80  BP Readings from Last 3 Encounters:  05/29/20 127/80  11/25/19 136/68  01/31/19 122/68   Patient has failed these meds in the past: n/a. Patient checks BP at home several times per month. Patient home BP readings are ranging: 120s/<70  Patient is currently at goal on the following medications:  . Lisinopril 20 mg  once daily  We discussed diet and exercise extensively. Limits bacon, otherwise not following any dietary plan. Regular vegetables/fruits. Golfs twice per week, active with yard work.  Plan  Continue current medications and control with diet and exercise.   Hyperlipidemia   LDL goal <70  Lipid Panel     Component Value Date/Time   CHOL 111 11/20/2019 0814   TRIG 65.0 11/20/2019 0814   TRIG 58 09/01/2006 0749   HDL 43.90 11/20/2019 0814   LDLCALC 54 11/20/2019 0814    Hepatic Function Latest Ref Rng & Units 05/29/2020 11/20/2019 10/25/2018  Total Protein 6.0 - 8.3 g/dL 6.0 6.2 6.9  Albumin 3.5 - 5.2 g/dL 4.4 4.4 4.8  AST 0 - 37 U/L 17 14 17   ALT 0 - 53 U/L 11 12 15   Alk Phosphatase 39 - 117 U/L 70 70 79  Total Bilirubin 0.2 - 1.2 mg/dL 1.2 0.9 1.3(H)  Bilirubin,  Direct 0.0 - 0.3 mg/dL - - -    The ASCVD Risk score Mikey Bussing DC Jr., et al., 2013) failed to calculate for the following reasons:   The valid total cholesterol range is 130 to 320 mg/dL   Denies side effects. Patient is currently at goal on the following medications:  . Atorvastatin 20 mg once daily  We discussed:  diet and exercise extensively - see HTN.  Plan  Continue current medications and control with diet and exercise.  Gout    Lab Results  Component Value Date   LABURIC 6.1 05/29/2020   LABURIC 7.3 03/24/2014   No gout attack in over 1 year. Counseled on avoidance of alcohol and low purine diet.  Patient is currently controlled on:  Marland Kitchen Diet alone  Reviewed foods that may contribute to gout. Handout on low-purine diet provided.   Plan  Continue control with diet and exercise  Vaccines   Immunization History  Administered Date(s) Administered  . Fluad Quad(high Dose 65+) 08/27/2019  . Influenza Split 10/03/2011  . Influenza Whole 08/29/2012  . Influenza, High Dose Seasonal PF 08/17/2015, 08/17/2015, 09/01/2016, 08/22/2018  . Influenza,inj,Quad PF,6+ Mos 09/05/2013, 09/08/2014  .  Influenza-Unspecified 08/17/2017, 08/28/2018  . PFIZER SARS-COV-2 Vaccination 01/07/2020, 01/28/2020  . Pneumococcal Conjugate-13 10/08/2013  . Pneumococcal Polysaccharide-23 10/02/2009, 02/04/2015  . Td 10/02/2009  . Tdap 11/26/2019  . Zoster 12/04/2009  . Zoster Recombinat (Shingrix) 12/28/2018, 10/05/2019   Reviewed and discussed patient's vaccination history.  Pfizer booster 08/25/2020, will receive annual flu in ~2 weeks.  Plan  Recommended patient receive annual flu vaccine.   Medication Management Coordination   [id number] [place] [address]Phone: [phone]: [phone]  Denies any issues with current medication management.   Plan  Continue current medication management strategy. ___________________________ SDOH (Social Determinants of Health) assessments performed: Yes.  Future Appointments  Date Time Provider [place] 12/03/2020  8:20 AM HMarin Olp [facility] 03/01/[facility]21  1:30 PM [facility] CCM PHARMACIST [facility]   Visit follow-up:  . CPA follow-up: 1-3 month BP call. .Marland KitchenRPH follow-up: 6 month telephone visit.  JMadelin Rear Pharm.D., BCGP Clinical Pharmacist [facility] ([phone]

## 2020-09-03 ENCOUNTER — Telehealth: Payer: Self-pay

## 2020-09-03 NOTE — Telephone Encounter (Signed)
Patient and wife are requesting to be removed from Chronic care management services  Rosalyn Gess

## 2020-09-04 NOTE — Telephone Encounter (Signed)
Ok thank you for letting me know, I will make note of this.

## 2020-09-20 ENCOUNTER — Encounter: Payer: Self-pay | Admitting: Family Medicine

## 2020-09-29 DIAGNOSIS — H353211 Exudative age-related macular degeneration, right eye, with active choroidal neovascularization: Secondary | ICD-10-CM | POA: Diagnosis not present

## 2020-09-29 DIAGNOSIS — H353122 Nonexudative age-related macular degeneration, left eye, intermediate dry stage: Secondary | ICD-10-CM | POA: Diagnosis not present

## 2020-09-29 DIAGNOSIS — H35033 Hypertensive retinopathy, bilateral: Secondary | ICD-10-CM | POA: Diagnosis not present

## 2020-10-25 ENCOUNTER — Other Ambulatory Visit: Payer: Self-pay | Admitting: Family Medicine

## 2020-11-10 DIAGNOSIS — H353122 Nonexudative age-related macular degeneration, left eye, intermediate dry stage: Secondary | ICD-10-CM | POA: Diagnosis not present

## 2020-11-10 DIAGNOSIS — H353211 Exudative age-related macular degeneration, right eye, with active choroidal neovascularization: Secondary | ICD-10-CM | POA: Diagnosis not present

## 2020-11-26 ENCOUNTER — Encounter: Payer: PPO | Admitting: Family Medicine

## 2020-12-02 NOTE — Progress Notes (Signed)
Phone: [phone]   Subjective:  Patient presents today for their annual physical. Chief complaint-noted.   See problem oriented charting- ROS- full  review of systems was completed and negative  except for: post nasal drip/runny nose- ongoing issues, visual problems with macular degeneration, occasional vertigo- rare  The following were reviewed and entered/updated in epic: Past Medical History:  Diagnosis Date  . Chicken pox   . Colitis, ulcerative ([place])   . Hypertension   . Macular degeneration, bilateral 07/2012   areds 2   . Psoriasis    Patient Active Problem List   Diagnosis Date Noted  . AAA (abdominal aortic aneurysm) ([place]) 05/31/2018    Priority: High  . Ulcerative colitis ([place]) 08/20/2007    Priority: High  . Former smoker 04/19/2018    Priority: Medium  . Hyperlipidemia 04/19/2018    Priority: Medium  . Vertigo 12/19/2017    Priority: Medium  . Gout 03/24/2014    Priority: Medium  . Elevated PSA, less than 10 ng/ml 09/09/2013    Priority: Medium  . Macular degeneration, bilateral 07/15/2012    Priority: Medium  . Obesity (BMI 30.0-34.9) 12/02/2008    Priority: Medium  . Essential hypertension 08/20/2007    Priority: Medium  . PSORIASIS 08/20/2007    Priority: Medium  . Onychomycosis 12/19/2017    Priority: Low  . Trochanteric bursitis of left hip 03/31/2015    Priority: Low   Past Surgical History:  Procedure Laterality Date  . ACNE CYST REMOVAL    . CATARACT EXTRACTION W/ INTRAOCULAR LENS IMPLANT  10/2014   bilat  . COLONOSCOPY    . FOOT SURGERY  1988  . SHOULDER ARTHROSCOPY  2007   Right  . TONSILLECTOMY      Family History  Problem Relation Age of Onset  . Heart disease Mother   . Breast cancer Mother   . Hypertension Mother   . Stroke Mother   . Hypothyroidism Mother   . Dementia Mother   . Heart disease Father        CAD/MI 6s or 33s smoker. led to death age 42.   Marland Kitchen Hypertension Father   . Diabetes Daughter   . Diabetes  Maternal Uncle   . Hepatitis C Brother   . Stroke Maternal Grandmother   . Emphysema Maternal Grandfather   . Arthritis Paternal Grandmother   . Colon cancer Neg Hx   . Esophageal cancer Neg Hx   . Stomach cancer Neg Hx   . Rectal cancer Neg Hx     Medications- reviewed and updated Current Outpatient Medications  Medication Sig Dispense Refill  . Ascorbic Acid (VITAMIN C PO) Take 1 tablet by mouth daily. 1000 mg    . atorvastatin (LIPITOR) 20 MG tablet TAKE 1 TABLET BY MOUTH EVERY DAY 90 tablet 3  . clobetasol ointment (TEMOVATE) 0.05 % Apply topically 2 (two) times daily as needed.    Marland Kitchen L-Lysine 1000 MG TABS Take 500 mg by mouth daily.     Marland Kitchen lisinopril (ZESTRIL) 20 MG tablet TAKE 1 TABLET BY MOUTH EVERY DAY 90 tablet 1  . meclizine (ANTIVERT) 25 MG tablet Take 1 tablet (25 mg total) by mouth every 6 (six) hours as needed for dizziness. 60 tablet 2  . Multiple Vitamins-Minerals (PRESERVISION AREDS 2) CAPS Take 1 capsule by mouth 2 (two) times daily before lunch and supper.     No current facility-administered medications for this visit.    Allergies-reviewed and updated Allergies  Allergen Reactions  . Sulfonamide  Derivatives     "have just been told that my entire life"    Social History   Social History Narrative   Married 1971. 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 [employer] 6 years.       ACP: HCPOA - wife. Yes for acute CPR, no for prolonged mechanical ventilatory support, no for prolonged artificial nutrition or other heroic measures leaving him in an incapacitated state.    Objective  Objective:  BP 110/76   Pulse (!) 56   Temp 98.2 F (36.8 C) (Temporal)   Ht 5' 9"  (1.753 m)   Wt 183 lb 6.4 oz (83.2 kg)   SpO2 97%   BMI 27.08 kg/m  Gen: NAD, resting comfortably HEENT: Mucous membranes are moist. Oropharynx normal Neck: no  thyromegaly CV: RRR no murmurs rubs or gallops Lungs: CTAB no crackles, wheeze, rhonchi Abdomen: soft/nontender/nondistended/normal bowel sounds. No rebound or guarding.  Ext: no edema Skin: warm, dry Neuro: grossly normal, moves all extremities, PERRLA   Assessment and Plan  70 y.o. male presenting for annual physical.  Health Maintenance counseling: 1. Anticipatory guidance: Patient counseled regarding regular dental exams -q6 months, eye exams -yearly at least-actually sees more regularly due to macular degeneration- injections stretched to 10 weeks and did well- didn't go to 12 weeks,  avoiding smoking and second hand smoke , limiting alcohol to 2 beverages per day-he does not drink alcohol, no illicit drugs.   2. Risk factor reduction:  Advised patient of need for regular exercise and diet rich and fruits and vegetables to reduce risk of heart attack and stroke. Exercise-golf when weather permits, active in his yard, had been doing some walking in summer - encouraged more regular exercise. Diet-continues to eat healthy diet- has maintained weight loss and actually down another 7 pounds from last year.  Patient continues to do an excellent job maintaining weight loss with prior max weight 289 at prior practice Wt Readings from Last 3 Encounters:  12/03/20 183 lb 6.4 oz (83.2 kg)  05/29/20 185 lb 6.4 oz (84.1 kg)  11/25/19 190 lb 3.2 oz (86.3 kg)  3. Immunizations/screenings/ancillary studies-fully up-to-date  Immunization History  Administered Date(s) Administered  . Fluad Quad(high Dose 65+) 08/27/2019, 09/19/2020  . Influenza Split 10/03/2011  . Influenza Whole 08/29/2012  . Influenza, High Dose Seasonal PF 08/17/2015, 08/17/2015, 09/01/2016, 08/22/2018  . Influenza,inj,Quad PF,6+ Mos 09/05/2013, 09/08/2014, 09/19/2020  . Influenza-Unspecified 08/17/2017, 08/28/2018  . PFIZER(Purple Top)SARS-COV-2 Vaccination 01/07/2020, 01/28/2020, 08/25/2020  . Pneumococcal Conjugate-13 10/08/2013   . Pneumococcal Polysaccharide-23 10/02/2009, 02/04/2015  . Td 10/02/2009  . Tdap 11/26/2019  . Zoster 12/04/2009  . Zoster Recombinat (Shingrix) 12/28/2018, 10/05/2019  4. Prostate cancer screening- last year we opted to discontinue screenings unless significant worsening in urinary symptoms.  Today he reports stable nocturia (worse if drinks late)- he has decided would prefer to do PSA with labs- consider through 33 with good overall health Lab Results  Component Value Date   PSA 2.56 11/20/2019   PSA 2.92 10/25/2018   PSA 2.49 10/19/2017   5. Colon cancer screening - due May 21, 2025-last completed July 2016 and normal at that time with Dr. Carlean Purl 6. Skin cancer screening-follows with Dr. Elvera Lennox for dermatology checks in may. advised regular sunscreen use- he does not use or could use high spf clothes as well. Denies worrisome, changing, or new  skin lesions.  7. FORMER smoker-quit smoking in 1990.  Aneurysm screening picked up initial aneurysm and then later noted as only dilation without true aneurysm. We did opt to get UA at least every 5years- last 2017 8. STD screening - not required as monogamous  Status of chronic or acute concerns   #Right hamstring pain-last visit we suspected hamstring strain/partial tear with bruising noted-plan was for ice and later heat as well as home exercise regimen and we discussed referral to sports medicine if failure to improve.-Today he reports much improved overall- only time really bothered him was when he had to do a lot of leaves   #hyperlipidemia S: Medication: Atorvastatin 20Mg Lab Results  Component Value Date   CHOL 111 11/20/2019   HDL 43.90 11/20/2019   LDLCALC 54 11/20/2019   TRIG 65.0 11/20/2019   CHOLHDL 3 11/20/2019   A/P: Due for lipid panel today-we will check this.  If LDL looks as great as last year-likely continue current medications  #hypertension S: medication: Lisinopril 20Mg Home readings #s:  Monitors at home  avg  133 74  Over last 7 readings since 11-06-20 BP Readings from Last 3 Encounters:  12/03/20 110/76  05/29/20 127/80  11/25/19 136/68  A/P:  Stable. Continue current medications.    #Gout S: 0 flares in a few years on no medicine.  Last year patient cut down on bacon (wife helps ration this) and it seemed to really help him-uric acid trended down from 7.3-6.1 Lab Results  Component Value Date   LABURIC 6.1 05/29/2020  A/P:uric acid looked much better last year and hopefully again today- iggest thing is that he is not having any flare ups    #Aortic dilation/aneurysm hx S: Previously listed as aortic aneurysm-on repeat ultrasound in 2021 no measurement over 3 cm A/P: Once again no code available for aortic dilation but we do need to follow this with prior reported aneurysm- we will likely repeat 3 to 5 years from last imaging  #Mild thrombocytopenia-mild chronic issue but other cell lines within normal-we will update CBC with labs today Lab Results  Component Value Date   WBC 4.2 05/29/2020   HGB 13.6 05/29/2020   HCT 39.3 05/29/2020   MCV 86.2 05/29/2020   PLT 123.0 (L) 05/29/2020   #vertigo- sparing issues- still has meclizine on hand if needed  #ulcerative colitis- remote issues- patient thinks was misdiagnosis- will continue to monitor for recurrence. Years or remission if this was a true diagnosis  Recommended follow up: Return in about 6 months (around 06/02/2021) for follow up- or sooner if needed.  Lab/Order associations: fasting   ICD-10-CM   1. Preventative health care  Z00.00 CBC with Differential/Platelet    Comprehensive metabolic panel    Lipid panel    Uric acid    POCT Urinalysis Dipstick (Automated)  2. Essential hypertension  I10 CBC with Differential/Platelet    Comprehensive metabolic panel    Lipid panel  3. Former smoker  Z87.891 POCT Urinalysis Dipstick (Automated)  4. Idiopathic gout, unspecified chronicity, unspecified site  M10.00 Uric acid  5.  Hyperlipidemia, unspecified hyperlipidemia type  E78.5 CBC with Differential/Platelet    Comprehensive metabolic panel    Lipid panel    No orders of the defined types were placed in this encounter.   Return precautions advised.  Garret Reddish, MD

## 2020-12-02 NOTE — Patient Instructions (Addendum)
Please stop by lab before you go If you have mychart- we will send your results within 3 business days of [place] receiving them.  If you do not have mychart- we will call you about results within 5 business days of [place] receiving them.  *please also note that you will see labs on mychart as soon as they post. I will later go in and write notes on them- will say "notes from Dr. Yong Channel"  You are eligible to schedule your annual wellness visit after 12/09/19 with our nurse specialist Otila Kluver.  Please consider scheduling this before you leave today  [place] job maintaining the weight loss!  Recommended follow up: Return in about 6 months (around 06/02/2021) for follow up- or sooner if needed.

## 2020-12-03 ENCOUNTER — Encounter: Payer: Self-pay | Admitting: Family Medicine

## 2020-12-03 ENCOUNTER — Other Ambulatory Visit: Payer: Self-pay

## 2020-12-03 ENCOUNTER — Ambulatory Visit (INDEPENDENT_AMBULATORY_CARE_PROVIDER_SITE_OTHER): Payer: PPO | Admitting: Family Medicine

## 2020-12-03 VITALS — BP 110/76 | HR 56 | Temp 98.2°F | Ht 69.0 in | Wt 183.4 lb

## 2020-12-03 DIAGNOSIS — I714 Abdominal aortic aneurysm, without rupture, unspecified: Secondary | ICD-10-CM

## 2020-12-03 DIAGNOSIS — E785 Hyperlipidemia, unspecified: Secondary | ICD-10-CM

## 2020-12-03 DIAGNOSIS — Z87891 Personal history of nicotine dependence: Secondary | ICD-10-CM | POA: Diagnosis not present

## 2020-12-03 DIAGNOSIS — M1000 Idiopathic gout, unspecified site: Secondary | ICD-10-CM

## 2020-12-03 DIAGNOSIS — Z0000 Encounter for general adult medical examination without abnormal findings: Secondary | ICD-10-CM | POA: Diagnosis not present

## 2020-12-03 DIAGNOSIS — I10 Essential (primary) hypertension: Secondary | ICD-10-CM

## 2020-12-03 DIAGNOSIS — K5100 Ulcerative (chronic) pancolitis without complications: Secondary | ICD-10-CM

## 2020-12-03 DIAGNOSIS — R351 Nocturia: Secondary | ICD-10-CM | POA: Diagnosis not present

## 2020-12-03 LAB — LIPID PANEL
Cholesterol: 112 mg/dL (ref 0–200)
HDL: 49.1 mg/dL (ref >39.00)
LDL Cholesterol: 49 mg/dL (ref 0–99)
NonHDL: 63.14
Total CHOL/HDL Ratio: 2
Triglycerides: 71 mg/dL (ref 0.0–149.0)
VLDL: 14.2 mg/dL (ref 0.0–40.0)

## 2020-12-03 LAB — CBC WITH DIFFERENTIAL/PLATELET
Basophils Absolute: 0 10*3/uL (ref 0.0–0.1)
Basophils Relative: 0.5 % (ref 0.0–3.0)
Eosinophils Absolute: 0.1 10*3/uL (ref 0.0–0.7)
Eosinophils Relative: 1.8 % (ref 0.0–5.0)
HCT: 42.8 % (ref 39.0–52.0)
Hemoglobin: 14.7 g/dL (ref 13.0–17.0)
Lymphocytes Relative: 19.7 % (ref 12.0–46.0)
Lymphs Abs: 1 10*3/uL (ref 0.7–4.0)
MCHC: 34.4 g/dL (ref 30.0–36.0)
MCV: 85.9 fl (ref 78.0–100.0)
Monocytes Absolute: 0.5 10*3/uL (ref 0.1–1.0)
Monocytes Relative: 10.9 % (ref 3.0–12.0)
Neutro Abs: 3.3 10*3/uL (ref 1.4–7.7)
Neutrophils Relative %: 67.1 % (ref 43.0–77.0)
Platelets: 141 10*3/uL — ABNORMAL LOW (ref 150.0–400.0)
RBC: 4.98 Mil/uL (ref 4.22–5.81)
RDW: 14.1 % (ref 11.5–15.5)
WBC: 4.9 10*3/uL (ref 4.0–10.5)

## 2020-12-03 LAB — POC URINALSYSI DIPSTICK (AUTOMATED)
Bilirubin, UA: NEGATIVE
Blood, UA: NEGATIVE
Glucose, UA: NEGATIVE
Ketones, UA: NEGATIVE
Leukocytes, UA: NEGATIVE
Nitrite, UA: NEGATIVE
Protein, UA: NEGATIVE
Spec Grav, UA: >=1.03 — AB (ref 1.010–1.025)
Urobilinogen, UA: 0.2 E.U./dL
pH, UA: 5 (ref 5.0–8.0)

## 2020-12-03 LAB — COMPREHENSIVE METABOLIC PANEL
ALT: 11 U/L (ref 0–53)
AST: 14 U/L (ref 0–37)
Albumin: 4.7 g/dL (ref 3.5–5.2)
Alkaline Phosphatase: 70 U/L (ref 39–117)
BUN: 19 mg/dL (ref 6–23)
CO2: 30 mEq/L (ref 19–32)
Calcium: 9.3 mg/dL (ref 8.4–10.5)
Chloride: 105 mEq/L (ref 96–112)
Creatinine, Ser: 0.95 mg/dL (ref 0.40–1.50)
GFR: 80.5 mL/min (ref >60.00)
Glucose, Bld: 91 mg/dL (ref 70–99)
Potassium: 4.2 mEq/L (ref 3.5–5.1)
Sodium: 141 mEq/L (ref 135–145)
Total Bilirubin: 1 mg/dL (ref 0.2–1.2)
Total Protein: 6.7 g/dL (ref 6.0–8.3)

## 2020-12-03 LAB — URIC ACID: Uric Acid, Serum: 5.8 mg/dL (ref 4.0–7.8)

## 2020-12-03 LAB — PSA: PSA: 2.93 ng/mL (ref 0.10–4.00)

## 2020-12-03 NOTE — Addendum Note (Signed)
Addended by: Adah Salvage F on: 12/03/2020 09:14 AM   Modules accepted: Orders

## 2020-12-14 DIAGNOSIS — H903 Sensorineural hearing loss, bilateral: Secondary | ICD-10-CM | POA: Diagnosis not present

## 2020-12-22 DIAGNOSIS — H353122 Nonexudative age-related macular degeneration, left eye, intermediate dry stage: Secondary | ICD-10-CM | POA: Diagnosis not present

## 2020-12-22 DIAGNOSIS — H353211 Exudative age-related macular degeneration, right eye, with active choroidal neovascularization: Secondary | ICD-10-CM | POA: Diagnosis not present

## 2021-01-06 DIAGNOSIS — H353211 Exudative age-related macular degeneration, right eye, with active choroidal neovascularization: Secondary | ICD-10-CM | POA: Diagnosis not present

## 2021-01-11 ENCOUNTER — Ambulatory Visit (INDEPENDENT_AMBULATORY_CARE_PROVIDER_SITE_OTHER): Payer: PPO

## 2021-01-11 DIAGNOSIS — Z0000 Encounter for general adult medical examination without abnormal findings: Secondary | ICD-10-CM

## 2021-01-11 NOTE — Patient Instructions (Addendum)
[name] , Thank you for taking time to come for your Medicare Wellness Visit. I appreciate your ongoing commitment to your health goals. Please review the following plan we discussed and let me know if I can assist you in the future.   Screening recommendations/referrals: Colonoscopy: Done 05/22/15 Recommended yearly ophthalmology/optometry visit for glaucoma screening and checkup Recommended yearly dental visit for hygiene and checkup  Vaccinations: Influenza vaccine: Up to date Pneumococcal vaccine: Up to date Tdap vaccine: Up to date Shingles vaccine: Completed 2/14 & 10/05/19   Covid-19: Completed 2/23, 3/16, & 08/25/20  Advanced directives: Copies in chart  Conditions/risks identified: None at this titme  Next appointment: Follow up in one year for your annual wellness visit.   Preventive Care 34 Years and Older, Male Preventive care refers to lifestyle choices and visits with your health care provider that can promote health and wellness. What does preventive care include?  A yearly physical exam. This is also called an annual well check.  Dental exams once or twice a year.  Routine eye exams. Ask your health care provider how often you should have your eyes checked.  Personal lifestyle choices, including:  Daily care of your teeth and gums.  Regular physical activity.  Eating a healthy diet.  Avoiding tobacco and drug use.  Limiting alcohol use.  Practicing safe sex.  Taking low doses of aspirin every day.  Taking vitamin and mineral supplements as recommended by your health care provider. What happens during an annual well check? The services and screenings done by your health care provider during your annual well check will depend on your age, overall health, lifestyle risk factors, and family history of disease. Counseling  Your health care provider may ask you questions about your:  Alcohol use.  Tobacco use.  Drug use.  Emotional  well-being.  Home and relationship well-being.  Sexual activity.  Eating habits.  History of falls.  Memory and ability to understand (cognition).  Work and work Statistician. Screening  You may have the following tests or measurements:  Height, weight, and BMI.  Blood pressure.  Lipid and cholesterol levels. These may be checked every 5 years, or more frequently if you are over 28 years old.  Skin check.  Lung cancer screening. You may have this screening every year starting at age 79 if you have a 30-pack-year history of smoking and currently smoke or have quit within the past 15 years.  Fecal occult blood test (FOBT) of the stool. You may have this test every year starting at age 12.  Flexible sigmoidoscopy or colonoscopy. You may have a sigmoidoscopy every 5 years or a colonoscopy every 10 years starting at age 19.  Prostate cancer screening. Recommendations will vary depending on your family history and other risks.  Hepatitis C blood test.  Hepatitis B blood test.  Sexually transmitted disease (STD) testing.  Diabetes screening. This is done by checking your blood sugar (glucose) after you have not eaten for a while (fasting). You may have this done every 1-3 years.  Abdominal aortic aneurysm (AAA) screening. You may need this if you are a current or former smoker.  Osteoporosis. You may be screened starting at age 88 if you are at high risk. Talk with your health care provider about your test results, treatment options, and if necessary, the need for more tests. Vaccines  Your health care provider may recommend certain vaccines, such as:  Influenza vaccine. This is recommended every year.  Tetanus, diphtheria, and acellular  pertussis (Tdap, Td) vaccine. You may need a Td booster every 10 years.  Zoster vaccine. You may need this after age 19.  Pneumococcal 13-valent conjugate (PCV13) vaccine. One dose is recommended after age 42.  Pneumococcal  polysaccharide (PPSV23) vaccine. One dose is recommended after age 45. Talk to your health care provider about which screenings and vaccines you need and how often you need them. This information is not intended to replace advice given to you by your health care provider. Make sure you discuss any questions you have with your health care provider. Document Released: 11/27/2015 Document Revised: 07/20/2016 Document Reviewed: 09/01/2015 Elsevier Interactive Patient Education  2017 [place] Prevention in the Home Falls can cause injuries. They can happen to people of all ages. There are many things you can do to make your home safe and to help prevent falls. What can I do on the outside of my home?  Regularly fix the edges of walkways and driveways and fix any cracks.  Remove anything that might make you trip as you walk through a door, such as a raised step or threshold.  Trim any bushes or trees on the path to your home.  Use bright outdoor lighting.  Clear any walking paths of anything that might make someone trip, such as rocks or tools.  Regularly check to see if handrails are loose or broken. Make sure that both sides of any steps have handrails.  Any raised decks and porches should have guardrails on the edges.  Have any leaves, snow, or ice cleared regularly.  Use sand or salt on walking paths during winter.  Clean up any spills in your garage right away. This includes oil or grease spills. What can I do in the bathroom?  Use night lights.  Install grab bars by the toilet and in the tub and shower. Do not use towel bars as grab bars.  Use non-skid mats or decals in the tub or shower.  If you need to sit down in the shower, use a plastic, non-slip stool.  Keep the floor dry. Clean up any water that spills on the floor as soon as it happens.  Remove soap buildup in the tub or shower regularly.  Attach bath mats securely with double-sided non-slip rug  tape.  Do not have throw rugs and other things on the floor that can make you trip. What can I do in the bedroom?  Use night lights.  Make sure that you have a light by your bed that is easy to reach.  Do not use any sheets or blankets that are too big for your bed. They should not hang down onto the floor.  Have a firm chair that has side arms. You can use this for support while you get dressed.  Do not have throw rugs and other things on the floor that can make you trip. What can I do in the kitchen?  Clean up any spills right away.  Avoid walking on wet floors.  Keep items that you use a lot in easy-to-reach places.  If you need to reach something above you, use a strong step stool that has a grab bar.  Keep electrical cords out of the way.  Do not use floor polish or wax that makes floors slippery. If you must use wax, use non-skid floor wax.  Do not have throw rugs and other things on the floor that can make you trip. What can I do with my stairs?  Do not leave any items on the stairs.  Make sure that there are handrails on both sides of the stairs and use them. Fix handrails that are broken or loose. Make sure that handrails are as long as the stairways.  Check any carpeting to make sure that it is firmly attached to the stairs. Fix any carpet that is loose or worn.  Avoid having throw rugs at the top or bottom of the stairs. If you do have throw rugs, attach them to the floor with carpet tape.  Make sure that you have a light switch at the top of the stairs and the bottom of the stairs. If you do not have them, ask someone to add them for you. What else can I do to help prevent falls?  Wear shoes that:  Do not have high heels.  Have rubber bottoms.  Are comfortable and fit you well.  Are closed at the toe. Do not wear sandals.  If you use a stepladder:  Make sure that it is fully opened. Do not climb a closed stepladder.  Make sure that both sides of the  stepladder are locked into place.  Ask someone to hold it for you, if possible.  Clearly mark and make sure that you can see:  Any grab bars or handrails.  First and last steps.  Where the edge of each step is.  Use tools that help you move around (mobility aids) if they are needed. These include:  Canes.  Walkers.  Scooters.  Crutches.  Turn on the lights when you go into a dark area. Replace any light bulbs as soon as they burn out.  Set up your furniture so you have a clear path. Avoid moving your furniture around.  If any of your floors are uneven, fix them.  If there are any pets around you, be aware of where they are.  Review your medicines with your doctor. Some medicines can make you feel dizzy. This can increase your chance of falling. Ask your doctor what other things that you can do to help prevent falls. This information is not intended to replace advice given to you by your health care provider. Make sure you discuss any questions you have with your health care provider. Document Released: 08/27/2009 Document Revised: 04/07/2016 Document Reviewed: 12/05/2014 Elsevier Interactive Patient Education  2017 [employer].

## 2021-01-11 NOTE — Progress Notes (Signed)
Virtual Visit via Telephone Note  I connected with  Edward Goodwin on 01/11/21 at  9:30 AM EST by telephone and verified that I am speaking with the correct person using two identifiers.  Medicare Annual Wellness visit completed telephonically due to Covid-19 pandemic.   Persons participating in this call: This Health Coach and this patient.  Location: Patient: Home Provider: Office    I discussed the limitations, risks, security and privacy concerns of performing an evaluation and management service by telephone and the availability of in person appointments. The patient expressed understanding and agreed to proceed.  Unable to perform video visit due to video visit attempted and failed and/or patient does not have video capability.   Some vital signs may be absent or patient reported.   Willette Brace, LPN    Subjective:   Edward Goodwin is a 70 y.o. male who presents for Medicare Annual/Subsequent preventive examination.  Review of Systems     Cardiac Risk Factors include: advanced age (>28mn, >>14women);dyslipidemia;hypertension;male gender     Objective:    There were no vitals filed for this visit. There is no height or weight on file to calculate BMI.  Advanced Directives 01/11/2021 12/09/2019 12/05/2018 04/08/2017 05/22/2015 05/08/2015  Does Patient Have a Medical Advance Directive? Yes Yes Yes Yes Yes Yes  Type of Advance Directive Healthcare Power of Attorney Living will;Healthcare Power of [place]  Does patient want to make changes to medical advance directive? - No - Patient declined No - Patient declined - - No - Patient declined  Copy of [place] Chart? Yes - validated most recent copy scanned in chart (See row information) Yes - validated most recent copy scanned in chart (See row  information) Yes - validated most recent copy scanned in chart (See row information) No - copy requested - No - copy requested    Current Medications (verified) Outpatient Encounter Medications as of 01/11/2021  Medication Sig  . Ascorbic Acid (VITAMIN C PO) Take 1 tablet by mouth daily. 1000 mg  . atorvastatin (LIPITOR) 20 MG tablet TAKE 1 TABLET BY MOUTH EVERY DAY  . clobetasol ointment (TEMOVATE) 0.05 % Apply topically 2 (two) times daily as needed.  .Marland KitchenL-Lysine 1000 MG TABS Take 500 mg by mouth daily.   .Marland Kitchenlisinopril (ZESTRIL) 20 MG tablet TAKE 1 TABLET BY MOUTH EVERY DAY  . meclizine (ANTIVERT) 25 MG tablet Take 1 tablet (25 mg total) by mouth every 6 (six) hours as needed for dizziness.  . Multiple Vitamins-Minerals (PRESERVISION AREDS 2) CAPS Take 1 capsule by mouth 2 (two) times daily before lunch and supper.   No facility-administered encounter medications on file as of 01/11/2021.    Allergies (verified) Sulfonamide derivatives   History: Past Medical History:  Diagnosis Date  . Chicken pox   . Colitis, ulcerative ([place]   . Hypertension   . Macular degeneration, bilateral 07/2012   areds 2   . Psoriasis    Past Surgical History:  Procedure Laterality Date  . ACNE CYST REMOVAL    . CATARACT EXTRACTION W/ INTRAOCULAR LENS IMPLANT  10/2014   bilat  . COLONOSCOPY    . FOOT SURGERY  1988  . SHOULDER ARTHROSCOPY  2007   Right  . TONSILLECTOMY     Family History  Problem Relation Age of Onset  . Heart disease Mother   .  Breast cancer Mother   . Hypertension Mother   . Stroke Mother   . Hypothyroidism Mother   . Dementia Mother   . Heart disease Father        CAD/MI 43s or 81s smoker. led to death age 23.   Marland Kitchen Hypertension Father   . Diabetes Daughter   . Diabetes Maternal Uncle   . Hepatitis C Brother   . Stroke Maternal Grandmother   . Emphysema Maternal Grandfather   . Arthritis Paternal Grandmother   . Colon cancer Neg Hx   . Esophageal cancer Neg Hx   .  Stomach cancer Neg Hx   . Rectal cancer Neg Hx    Social History   Socioeconomic History  . Marital status: Married    Spouse name: Not on file  . Number of children: 1  . Years of education: 71  . Highest education level: Not on file  Occupational History  . Occupation: Herbalist    Comment: retired   Tobacco Use  . Smoking status: Former Smoker    Quit date: 10/23/1989    Years since quitting: 31.2  . Smokeless tobacco: Never Used  Vaping Use  . Vaping Use: Never used  Substance and Sexual Activity  . Alcohol use: Yes    Alcohol/week: 0.0 standard drinks    Comment: rare glass of wine - less than once a year  . Drug use: No  . Sexual activity: Not Currently  Other Topics Concern  . Not on file  Social History Narrative   Married 1971. 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 [employer] 6 years.       ACP: HCPOA - wife. Yes for acute CPR, no for prolonged mechanical ventilatory support, no for prolonged artificial nutrition or other heroic measures leaving him in an incapacitated state.    Social Determinants of Health   Financial Resource Strain: Low Risk   . Difficulty of Paying Living Expenses: Not hard at all  Food Insecurity: No Food Insecurity  . Worried About Charity fundraiser in the Last Year: Never true  . Ran Out of Food in the Last Year: Never true  Transportation Needs: No Transportation Needs  . Lack of Transportation (Medical): No  . Lack of Transportation (Non-Medical): No  Physical Activity: Sufficiently Active  . Days of Exercise per Week: 2 days  . Minutes of Exercise per Session: 120 min  Stress: No Stress Concern Present  . Feeling of Stress : Not at all  Social Connections: Moderately Integrated  . Frequency of Communication with Friends and Family: More than three times a week  . Frequency of Social Gatherings with  Friends and Family: Three times a week  . Attends Religious Services: More than 4 times per year  . Active Member of Clubs or Organizations: No  . Attends Archivist Meetings: Never  . Marital Status: Married    Tobacco Counseling Counseling given: Not Answered   Clinical Intake:  Pre-visit preparation completed: Yes  Pain : No/denies pain     BMI - recorded: 27.08 Nutritional Status: BMI 25 -29 Overweight Nutritional Risks: None Diabetes: No  How often do you need to have someone help you when you read instructions, pamphlets, or other written materials from your doctor or pharmacy?: 1 - Never  Diabetic?No  Interpreter Needed?: No  Information entered  by :: Charlott Rakes, LPN   Activities of Daily Living In your present state of health, do you have any difficulty performing the following activities: 01/11/2021  Hearing? Y  Comment wears hearing aids  Vision? N  Difficulty concentrating or making decisions? N  Walking or climbing stairs? N  Dressing or bathing? N  Doing errands, shopping? N  Preparing Food and eating ? N  Using the Toilet? N  In the past six months, have you accidently leaked urine? N  Do you have problems with loss of bowel control? N  Managing your Medications? N  Managing your Finances? N  Housekeeping or managing your Housekeeping? N  Some recent data might be hidden    Patient Care Team: [name], MD as PCP - General (Family Medicine) [place], [place], [place] as Consulting Physician (Optometry) Harriett Sine, MD as Consulting Physician (Dermatology) Princess Bruins, MD as Consulting Physician (Ophthalmology) Madelin Rear, [facility] as Pharmacist (Pharmacist)  Indicate any recent Medical Services you may have received from other than [facility] providers in the past year (date may be approximate).     Assessment:   This is a routine wellness examination for Edward Goodwin.  Hearing/Vision screen  Hearing Screening   125Hz  250Hz  500Hz   1000Hz  2000Hz  3000Hz  4000Hz  6000Hz  8000Hz   Right ear:           Left ear:           Comments: Pt wears hearing aids   Vision Screening Comments: Pt follows up with [place] eye care annually and also [facility] for macular degeneration treatment   Dietary issues and exercise activities discussed: Current Exercise Habits: Home exercise routine, Type of exercise: walking (golfing), Time (Minutes): > 60, Frequency (Times/Week): 2, Weekly Exercise (Minutes/Week): 0  Goals    . Patient Stated     None at this time     . PharmD Care Plan     CARE PLAN ENTRY (see longitudinal plan of care for additional care plan information)  Current Barriers:  . Chronic Disease Management support, education, and care coordination needs related to Hypertension, Hyperlipidemia, and Gout   Hypertension BP Readings from Last 3 Encounters:  05/29/20 127/80  11/25/19 136/68  01/31/19 122/68   . Pharmacist Clinical Goal(s): o Over the next 180 days, patient will work with PharmD and providers to maintain BP goal <130/80 . Current regimen:  o Lisinopril 20 mg once daily . Interventions: o Diet/exercise recommendations . Patient self care activities - Over the next 180 days, patient will: o Check BP at elast once every 1-2 weeks, document, and provide at future appointments o Ensure daily salt intake < 1800 mg/day  Hyperlipidemia Lab Results  Component Value Date/Time   LDLCALC 54 11/20/2019 08:14 AM   . Pharmacist Clinical Goal(s): o Over the next 180 days, patient will work with PharmD and providers to maintain LDL goal < 70 . Current regimen:  o Atorvastatin 20 mg once daily . Interventions: o Diet / exercise recommendations . Patient self care activities - Over the next 180 days, patient will: o Continue current management Gout . Pharmacist Clinical Goal(s) o Over the next 180 days, patient will work with PharmD and providers to minimize symptoms of gout . Current regimen:   o N/a . Interventions: o Discussed low purine diet . Patient self care activities - Over the next 180 days, patient will: o Continue current management  Medication management . Pharmacist Clinical Goal(s): o Over the next 180 days, patient will work with PharmD and  providers to maintain optimal medication adherence . Current pharmacy: [facility] . Interventions o Comprehensive medication review performed. o Continue current medication management strategy . Patient self care activities - Over the next 180 days, patient will: o Take medications as prescribed o Report any questions or concerns to PharmD and/or provider(s) Initial goal documentation.    . Weight (lb) < 200 lb (90.7 kg)     Patient has a goal of getting to 200lb for his physical this year      Depression Screen PHQ 2/9 Scores 01/11/2021 12/03/2020 12/09/2019 12/05/2018 10/25/2018 12/19/2017 09/23/2016  PHQ - 2 Score 0 0 0 0 0 0 0  PHQ- 9 Score - 0 - - - - -    Fall Risk Fall Risk  01/11/2021 12/03/2020 12/09/2019 11/25/2019 12/05/2018  Falls in the past year? 1 0 1 1 1   Comment - - - Pt says that he tripped over a place in his driveway. -  Number falls in past yr: 1 0 0 - 0  Comment - - - - tripped on uneven surface in driveway  Injury with Fall? 0 0 1 1 1   Comment - - - scrapes and bruises Scraped face and hands in fall  Risk for fall due to : Impaired vision;Impaired balance/gait - History of fall(s) - -  Risk for fall due to: Comment tripped up in living room and fell - - - -  Follow up Falls prevention discussed - Falls evaluation completed;Education provided;Falls prevention discussed - -    FALL RISK PREVENTION PERTAINING TO THE HOME:  Any stairs in or around the home? Yes  If so, are there any without handrails? No  Home free of loose throw rugs in walkways, pet beds, electrical cords, etc? Yes  Adequate lighting in your home to reduce risk of falls? Yes   ASSISTIVE DEVICES UTILIZED TO PREVENT FALLS:  Life alert?  No  Use of a cane, walker or w/c? No  Grab bars in the bathroom? No  Shower chair or bench in shower? No  Elevated toilet seat or a handicapped toilet? Yes   TIMED UP AND GO:  Was the test performed? No .      Cognitive Function: MMSE - Mini Mental State Exam 12/05/2018  Orientation to time 4  Orientation to Place 5  Registration 3  Attention/ Calculation 5  Recall 3  Language- name 2 objects 2  Language- repeat 1  Language- follow 3 step command 3  Language- read & follow direction 1  Write a sentence 1  Copy design 1  Total score 29     6CIT Screen 01/11/2021 12/09/2019  What Year? 0 points 0 points  What month? 0 points 0 points  What time? - 0 points  Count back from 20 0 points 0 points  Months in reverse 0 points 0 points  Repeat phrase 0 points 0 points  Total Score - 0    Immunizations Immunization History  Administered Date(s) Administered  . Fluad Quad(high Dose 65+) 08/27/2019, 09/19/2020  . Influenza Split 10/03/2011  . Influenza Whole 08/29/2012  . Influenza, High Dose Seasonal PF 08/17/2015, 08/17/2015, 09/01/2016, 08/22/2018  . Influenza,inj,Quad PF,6+ Mos 09/05/2013, 09/08/2014, 09/19/2020  . Influenza-Unspecified 08/17/2017, 08/28/2018  . PFIZER(Purple Top)SARS-COV-2 Vaccination 01/07/2020, 01/28/2020, 08/25/2020  . Pneumococcal Conjugate-13 10/08/2013  . Pneumococcal Polysaccharide-23 10/02/2009, 02/04/2015  . Td 10/02/2009  . Tdap 11/26/2019  . Zoster 12/04/2009  . Zoster Recombinat (Shingrix) 12/28/2018, 10/05/2019    TDAP status: Up to date  Flu  Vaccine status: Up to date  Pneumococcal vaccine status: Up to date  Covid-19 vaccine status: Completed vaccines  Qualifies for Shingles Vaccine? Yes   Zostavax completed Yes   Shingrix Completed?: Yes  Screening Tests Health Maintenance  Topic Date Due  . COLONOSCOPY (Pts 45-56yr Insurance coverage will need to be confirmed)  05/21/2025  . TETANUS/TDAP  11/25/2029  . INFLUENZA  VACCINE  Completed  . COVID-19 Vaccine  Completed  . Hepatitis C Screening  Completed  . PNA vac Low Risk Adult  Completed    Health Maintenance  There are no preventive care reminders to display for this patient.  Colorectal cancer screening: Type of screening: Colonoscopy. Completed 05/22/15. Repeat every 10 years  Additional Screening:  Hepatitis C Screening:  Completed 11/15/16  Vision Screening: Recommended annual ophthalmology exams for early detection of glaucoma and other disorders of the eye. Is the patient up to date with their annual eye exam?  Yes  Who is the provider or what is the name of the office in which the patient attends annual eye exams? [facility]  If pt is not established with a provider, would they like to be referred to a provider to establish care? No .   Dental Screening: Recommended annual dental exams for proper oral hygiene  Community Resource Referral / Chronic Care Management: CRR required this visit?  No   CCM required this visit?  No      Plan:     I have personally reviewed and noted the following in the patient's chart:   . Medical and social history . Use of alcohol, tobacco or illicit drugs  . Current medications and supplements . Functional ability and status . Nutritional status . Physical activity . Advanced directives . List of other physicians . Hospitalizations, surgeries, and ER visits in previous 12 months . Vitals . Screenings to include cognitive, depression, and falls . Referrals and appointments  In addition, I have reviewed and discussed with patient certain preventive protocols, quality metrics, and best practice recommendations. A written personalized care plan for preventive services as well as general preventive health recommendations were provided to patient.     TWillette Brace LPN   [id number]  Nurse Notes: None

## 2021-01-16 NOTE — Progress Notes (Signed)
I have personally reviewed the Medicare Annual Wellness Visit and agree with the documentation.  Edward Goodwin. Jerline Pain, MD 01/16/2021 10:44 AM

## 2021-02-02 DIAGNOSIS — H35033 Hypertensive retinopathy, bilateral: Secondary | ICD-10-CM | POA: Diagnosis not present

## 2021-02-02 DIAGNOSIS — H353211 Exudative age-related macular degeneration, right eye, with active choroidal neovascularization: Secondary | ICD-10-CM | POA: Diagnosis not present

## 2021-02-02 DIAGNOSIS — H353122 Nonexudative age-related macular degeneration, left eye, intermediate dry stage: Secondary | ICD-10-CM | POA: Diagnosis not present

## 2021-03-01 ENCOUNTER — Telehealth: Payer: PPO

## 2021-03-16 DIAGNOSIS — H353211 Exudative age-related macular degeneration, right eye, with active choroidal neovascularization: Secondary | ICD-10-CM | POA: Diagnosis not present

## 2021-03-16 DIAGNOSIS — H353122 Nonexudative age-related macular degeneration, left eye, intermediate dry stage: Secondary | ICD-10-CM | POA: Diagnosis not present

## 2021-03-17 ENCOUNTER — Other Ambulatory Visit: Payer: Self-pay | Admitting: Family Medicine

## 2021-04-14 DIAGNOSIS — L821 Other seborrheic keratosis: Secondary | ICD-10-CM | POA: Diagnosis not present

## 2021-04-14 DIAGNOSIS — L814 Other melanin hyperpigmentation: Secondary | ICD-10-CM | POA: Diagnosis not present

## 2021-04-14 DIAGNOSIS — D1801 Hemangioma of skin and subcutaneous tissue: Secondary | ICD-10-CM | POA: Diagnosis not present

## 2021-04-14 DIAGNOSIS — L400 Psoriasis vulgaris: Secondary | ICD-10-CM | POA: Diagnosis not present

## 2021-04-27 DIAGNOSIS — H353122 Nonexudative age-related macular degeneration, left eye, intermediate dry stage: Secondary | ICD-10-CM | POA: Diagnosis not present

## 2021-04-27 DIAGNOSIS — H35033 Hypertensive retinopathy, bilateral: Secondary | ICD-10-CM | POA: Diagnosis not present

## 2021-04-27 DIAGNOSIS — H353211 Exudative age-related macular degeneration, right eye, with active choroidal neovascularization: Secondary | ICD-10-CM | POA: Diagnosis not present

## 2021-04-27 DIAGNOSIS — H43813 Vitreous degeneration, bilateral: Secondary | ICD-10-CM | POA: Diagnosis not present

## 2021-04-28 ENCOUNTER — Other Ambulatory Visit: Payer: Self-pay | Admitting: Family Medicine

## 2021-04-29 ENCOUNTER — Encounter: Payer: Self-pay | Admitting: Family Medicine

## 2021-06-08 DIAGNOSIS — H353211 Exudative age-related macular degeneration, right eye, with active choroidal neovascularization: Secondary | ICD-10-CM | POA: Diagnosis not present

## 2021-06-08 DIAGNOSIS — H353122 Nonexudative age-related macular degeneration, left eye, intermediate dry stage: Secondary | ICD-10-CM | POA: Diagnosis not present

## 2021-06-10 ENCOUNTER — Ambulatory Visit (INDEPENDENT_AMBULATORY_CARE_PROVIDER_SITE_OTHER): Payer: PPO | Admitting: Family Medicine

## 2021-06-10 ENCOUNTER — Encounter: Payer: Self-pay | Admitting: Family Medicine

## 2021-06-10 ENCOUNTER — Other Ambulatory Visit: Payer: Self-pay

## 2021-06-10 VITALS — BP 109/74 | HR 58 | Temp 97.9°F | Ht 69.0 in | Wt 185.8 lb

## 2021-06-10 DIAGNOSIS — E785 Hyperlipidemia, unspecified: Secondary | ICD-10-CM

## 2021-06-10 DIAGNOSIS — R42 Dizziness and giddiness: Secondary | ICD-10-CM | POA: Diagnosis not present

## 2021-06-10 DIAGNOSIS — I10 Essential (primary) hypertension: Secondary | ICD-10-CM

## 2021-06-10 DIAGNOSIS — M1000 Idiopathic gout, unspecified site: Secondary | ICD-10-CM | POA: Diagnosis not present

## 2021-06-10 DIAGNOSIS — I714 Abdominal aortic aneurysm, without rupture, unspecified: Secondary | ICD-10-CM

## 2021-06-10 NOTE — Progress Notes (Signed)
Phone [phone] In person visit   Subjective:   Edward Goodwin is a 70 y.o. year old very pleasant male patient who presents for/with See problem oriented charting Chief Complaint  Patient presents with   Hypertension   Hyperlipidemia   Gout   Extremity Weakness    Pt states that he experienced numbing in his right leg while driving. He says that he could not feel the pedal for a while, but he did regain feeling soon after.     This visit occurred during the SARS-CoV-2 public health emergency.  Safety protocols were in place, including screening questions prior to the visit, additional usage of staff PPE, and extensive cleaning of exam room while observing appropriate contact time as indicated for disinfecting solutions.   Past Medical History-  Patient Active Problem List   Diagnosis Date Noted   AAA (abdominal aortic aneurysm) ([place]) 05/31/2018    Priority: High   Ulcerative colitis ([place]) 08/20/2007    Priority: High   Former smoker 04/19/2018    Priority: Medium   Hyperlipidemia 04/19/2018    Priority: Medium   Vertigo 12/19/2017    Priority: Medium   Gout 03/24/2014    Priority: Medium   Elevated PSA, less than 10 ng/ml 09/09/2013    Priority: Medium   Macular degeneration, bilateral 07/15/2012    Priority: Medium   Obesity (BMI 30.0-34.9) 12/02/2008    Priority: Medium   Essential hypertension 08/20/2007    Priority: Medium   PSORIASIS 08/20/2007    Priority: Medium   Onychomycosis 12/19/2017    Priority: Low   Trochanteric bursitis of left hip 03/31/2015    Priority: Low    Medications- reviewed and updated Current Outpatient Medications  Medication Sig Dispense Refill   Ascorbic Acid (VITAMIN C PO) Take 1 tablet by mouth daily. 1000 mg     atorvastatin (LIPITOR) 20 MG tablet TAKE 1 TABLET BY MOUTH EVERY DAY 90 tablet 3   clobetasol ointment (TEMOVATE) 0.05 % Apply topically 2 (two) times daily as needed.     L-Lysine 1000 MG TABS Take 500 mg by mouth  daily.      lisinopril (ZESTRIL) 20 MG tablet TAKE 1 TABLET BY MOUTH EVERY DAY 90 tablet 1   meclizine (ANTIVERT) 25 MG tablet Take 1 tablet (25 mg total) by mouth every 6 (six) hours as needed for dizziness. 60 tablet 2   Multiple Vitamins-Minerals (PRESERVISION AREDS 2) CAPS Take 1 capsule by mouth 2 (two) times daily before lunch and supper.     No current facility-administered medications for this visit.     Objective:  BP 109/74   Pulse (!) 58   Temp 97.9 F (36.6 C) (Temporal)   Ht 5' 9"  (1.753 m)   Wt 185 lb 12.8 oz (84.3 kg)   SpO2 97%   BMI 27.44 kg/m  Gen: NAD, resting comfortably CV: RRR (not bradycardic on exam) no murmurs rubs or gallops Lungs: CTAB no crackles, wheeze, rhonchi Abdomen: soft/nontender/nondistended/normal bowel sounds.  Ext: no edema Skin: warm, dry    Assessment and Plan   # social update- mowing grass weekly gets 2 miles in walking behind [employer], golfing twice a week- encouraged he is active but could increase exercise  #Right foot numbness-patient reports feeling some numbness in his right lower leg while driving long.  Happened yesterday afternoon  (lasted under a minute when happened and happened 3 x- getting out of car improved symptoms)and happened a long time ago as well. Does not feel  like a cramp. Feels like mainly below the knee- no issue in hand. He did not feel that he could feel the pedal well for a while but did regain feeling soon after-episode lasted With episode-No facial or extremity weakness. No slurred words or trouble swallowing. no blurry vision or double vision. No paresthesias outside of right leg. No confusion or word finding difficulties.  -he will follow up with [place] if becomes more persistent  #yellow nail- treatment x 2 for podiatry- wants to hold off on podiatry for now  #hypertension S: medication: Lisinopril 20 mg once daily Home readings #s: reasonable control on home checks- rarely over 140 within last year  perhaps 2-3 x. Bottom # mostly controlled- drank water when high in past and improved -289 max weight at prior practice- has done an incredible job maintaining weight loss BP Readings from Last 3 Encounters:  06/10/21 109/74  12/03/20 110/76  05/29/20 127/80  A/P: Stable. Continue current medications.   #hyperlipidemia S: Medication:Atorvastatin 20 mg once daily Lab Results  Component Value Date   CHOL 112 12/03/2020   HDL 49.10 12/03/2020   LDLCALC 49 12/03/2020   TRIG 71.0 12/03/2020   CHOLHDL 2 12/03/2020  A/P: very well controlled- continue current meds  #Gout- uric acid has been at goal under 6 S: 0 flares in  last 6 months on no medication -patient did report he cut down on bacon in 2021 - wife helped to ration this and this seemed to help him A/P:doing well with bacon rationing without flare ups- continue to monitor- last uric acid under 6- can recheck next visit   # Aortic dilation/ aneurysm hx- Patient had this previously listed as aortic aneurysm - on repeat ultrasound in 2021 with no measurement over 3 cm - there was no code available for aortic dilation - planned to follow this due to prior report of aneurysm - likely to repeat 2-5 years from last imaging  - may remove diagnosis if not noted on next check  #Vertigo/BPPV-sparing issues - still has meclizine 25 mg on hand if needed - no recent issues. Some orthostatic issues  #macular degeneration- close follow up with optho Dr. Jule Ser- saw him on Tuesday -6 week checks- last check slightly worse- considering when contrast situation better having advanced imaging  #mild thrombocytopenia- chronic stable, other cell lines ok- trend at least annually - wants to hold off on labs today  %Ulcerative colitis- remote issues - patient thinks was misdiagnosed- we planned to continue to monitor for recurrence. Years of remission if this was a true diagnosis  - no blood in stool or abdominal cramping   # Right Hamstring Pain S:We  did suspect hamstring strain/partial tear with noted bruising on a past visit from 05/29/2020 - patient stated he was playing golf and this could have been a factor. Plan was to ice and later apply heat afterwards as well as a home exercise regimen.  - referral to sports medicine was discussed in 12/03/2020 visit if failed to see improvement -  patient did report noticing improvement overall in last visit and that he was only bothered when he had to rakes a lot of leaves.  Today reports no recent issues A/P: glad he is doing better- continue to monitor  Recommended follow up: Return in about 6 months (around 12/11/2021) for physical or sooner if needed. Future Appointments  Date Time Provider [place]  01/24/2022  9:30 AM [facility]    Lab/Order associations:  ICD-10-CM   1. Hyperlipidemia, unspecified hyperlipidemia type  E78.5     2. Essential hypertension  I10     3. Idiopathic gout, unspecified chronicity, unspecified site  M10.00     4. Abdominal aortic aneurysm (AAA) without rupture (HCC)  I71.4     5. Vertigo  R42      I,Harris Phan,acting as a scribe for Garret Reddish, MD.,have documented all relevant documentation on the behalf of Garret Reddish, MD,as directed by  Garret Reddish, MD while in the presence of Garret Reddish, MD.  I, Garret Reddish, MD, have reviewed all documentation for this visit. The documentation on 06/10/21 for the exam, diagnosis, procedures, and orders are all accurate and complete.   Return precautions advised.  Garret Reddish, MD

## 2021-06-10 NOTE — Patient Instructions (Addendum)
I am glad to hear you right hamstring continues to improve. Continue to monitor if you notice any changes.  Everything else look stable- blood work next visit since looked so good last time  Recommended follow up: Return in about 6 months (around 12/11/2021) for physical or sooner if needed.

## 2021-07-20 DIAGNOSIS — H353211 Exudative age-related macular degeneration, right eye, with active choroidal neovascularization: Secondary | ICD-10-CM | POA: Diagnosis not present

## 2021-07-20 DIAGNOSIS — H353122 Nonexudative age-related macular degeneration, left eye, intermediate dry stage: Secondary | ICD-10-CM | POA: Diagnosis not present

## 2021-08-23 DIAGNOSIS — H43813 Vitreous degeneration, bilateral: Secondary | ICD-10-CM | POA: Diagnosis not present

## 2021-08-23 DIAGNOSIS — H35033 Hypertensive retinopathy, bilateral: Secondary | ICD-10-CM | POA: Diagnosis not present

## 2021-08-23 DIAGNOSIS — H353122 Nonexudative age-related macular degeneration, left eye, intermediate dry stage: Secondary | ICD-10-CM | POA: Diagnosis not present

## 2021-08-23 DIAGNOSIS — H353211 Exudative age-related macular degeneration, right eye, with active choroidal neovascularization: Secondary | ICD-10-CM | POA: Diagnosis not present

## 2021-08-28 ENCOUNTER — Encounter: Payer: Self-pay | Admitting: Family Medicine

## 2021-09-27 DIAGNOSIS — H353211 Exudative age-related macular degeneration, right eye, with active choroidal neovascularization: Secondary | ICD-10-CM | POA: Diagnosis not present

## 2021-09-27 DIAGNOSIS — H43813 Vitreous degeneration, bilateral: Secondary | ICD-10-CM | POA: Diagnosis not present

## 2021-09-27 DIAGNOSIS — H353122 Nonexudative age-related macular degeneration, left eye, intermediate dry stage: Secondary | ICD-10-CM | POA: Diagnosis not present

## 2021-10-16 ENCOUNTER — Encounter: Payer: Self-pay | Admitting: Family Medicine

## 2021-10-21 ENCOUNTER — Other Ambulatory Visit: Payer: Self-pay | Admitting: Family Medicine

## 2021-11-02 DIAGNOSIS — H353211 Exudative age-related macular degeneration, right eye, with active choroidal neovascularization: Secondary | ICD-10-CM | POA: Diagnosis not present

## 2021-11-02 DIAGNOSIS — H43813 Vitreous degeneration, bilateral: Secondary | ICD-10-CM | POA: Diagnosis not present

## 2021-11-02 DIAGNOSIS — H353122 Nonexudative age-related macular degeneration, left eye, intermediate dry stage: Secondary | ICD-10-CM | POA: Diagnosis not present

## 2021-12-14 DIAGNOSIS — H35033 Hypertensive retinopathy, bilateral: Secondary | ICD-10-CM | POA: Diagnosis not present

## 2021-12-14 DIAGNOSIS — H353122 Nonexudative age-related macular degeneration, left eye, intermediate dry stage: Secondary | ICD-10-CM | POA: Diagnosis not present

## 2021-12-14 DIAGNOSIS — H43812 Vitreous degeneration, left eye: Secondary | ICD-10-CM | POA: Diagnosis not present

## 2021-12-14 DIAGNOSIS — H353211 Exudative age-related macular degeneration, right eye, with active choroidal neovascularization: Secondary | ICD-10-CM | POA: Diagnosis not present

## 2021-12-15 NOTE — Progress Notes (Signed)
Phone: [phone]   Subjective:  Patient presents today for their annual physical. Chief complaint-noted.   See problem oriented charting- ROS- full  review of systems was completed and negative  except for: runny nose, chest discomfort- see below, urinary frequency, nocturia, difficulty urinating, right nck pain, sparing orthostatic symptoms  The following were reviewed and entered/updated in epic: Past Medical History:  Diagnosis Date   Chicken pox    Colitis, ulcerative ([place])    Hypertension    Macular degeneration, bilateral 07/2012   areds 2    Psoriasis    Patient Active Problem List   Diagnosis Date Noted   nonobstructive CAD (coronary artery disease)- CAC 247 on 05/03/2018  12/16/2021    Priority: High   Abdominal aortic aneurysm (AAA) without rupture, unspecified part     Priority: High   Ulcerative colitis ([place]) 08/20/2007    Priority: High   Former smoker 04/19/2018    Priority: Medium    Hyperlipidemia 04/19/2018    Priority: Medium    Vertigo 12/19/2017    Priority: Medium    Gout 03/24/2014    Priority: Medium    Elevated PSA, less than 10 ng/ml 09/09/2013    Priority: Medium    Macular degeneration, bilateral 07/15/2012    Priority: Medium    Obesity (BMI 30.0-34.9) 12/02/2008    Priority: Medium    Essential hypertension 08/20/2007    Priority: Medium    PSORIASIS 08/20/2007    Priority: Medium    Onychomycosis 12/19/2017    Priority: Low   Trochanteric bursitis of left hip 03/31/2015    Priority: Low   Past Surgical History:  Procedure Laterality Date   ACNE CYST REMOVAL     CATARACT EXTRACTION W/ INTRAOCULAR LENS IMPLANT  10/2014   bilat   COLONOSCOPY     FOOT SURGERY  1988   SHOULDER ARTHROSCOPY  2007   Right   TONSILLECTOMY      Family History  Problem Relation Age of Onset   Heart disease Mother    Breast cancer Mother    Hypertension Mother    Stroke Mother    Hypothyroidism Mother    Dementia Mother    Heart disease  Father        CAD/MI 60s or 71s smoker. led to death age 47.    Hypertension Father    Diabetes Daughter    Diabetes Maternal Uncle    Hepatitis C Brother    Stroke Maternal Grandmother    Emphysema Maternal Grandfather    Arthritis Paternal Grandmother    Colon cancer Neg Hx    Esophageal cancer Neg Hx    Stomach cancer Neg Hx    Rectal cancer Neg Hx     Medications- reviewed and updated Current Outpatient Medications  Medication Sig Dispense Refill   Ascorbic Acid (VITAMIN C PO) Take 1 tablet by mouth daily. 1000 mg     atorvastatin (LIPITOR) 20 MG tablet TAKE 1 TABLET BY MOUTH EVERY DAY 90 tablet 3   clobetasol ointment (TEMOVATE) 0.05 % Apply topically 2 (two) times daily as needed.     L-Lysine 1000 MG TABS Take 500 mg by mouth daily.      lisinopril (ZESTRIL) 20 MG tablet TAKE 1 TABLET BY MOUTH EVERY DAY 90 tablet 1   meclizine (ANTIVERT) 25 MG tablet Take 1 tablet (25 mg total) by mouth every 6 (six) hours as needed for dizziness. 60 tablet 2   Multiple Vitamins-Minerals (PRESERVISION AREDS 2) CAPS Take 1 capsule  by mouth 2 (two) times daily before lunch and supper.     No current facility-administered medications for this visit.    Allergies-reviewed and updated Allergies  Allergen Reactions   Sulfonamide Derivatives     "have just been told that my entire life"    Social History   Social History Narrative   Married 1971. 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 [employer] 6 years.       ACP: HCPOA - wife. Yes for acute CPR, no for prolonged mechanical ventilatory support, no for prolonged artificial nutrition or other heroic measures leaving him in an incapacitated state.    Objective  Objective:  BP 110/70    Pulse 64    Temp 97.6 F (36.4 C)    Ht 5' 9"  (1.753 m)    Wt 186 lb 12.8 oz (84.7 kg)    SpO2 98%    BMI 27.59 kg/m  Gen: NAD, resting  comfortably HEENT: Mucous membranes are moist. Oropharynx normal Neck: no thyromegaly CV: RRR no murmurs rubs or gallops Lungs: CTAB no crackles, wheeze, rhonchi. No pain with deep breaths Abdomen: soft/nontender/nondistended/normal bowel sounds. No rebound or guarding.  Ext: no edema Skin: warm, dry Neuro: grossly normal, moves all extremities, PERRLA  See other note about ekg    Assessment and Plan  71 y.o. male presenting for annual physical.  Health Maintenance counseling: 1. Anticipatory guidance: Patient counseled regarding regular dental exams -q6 months, eye exams -yearly at least-actually sees more regularly due to macular degeneration- injections q6 weeks (did worse with 12 weeks),  avoiding smoking and second hand smoke, limiting alcohol to 2 beverages per day- he does not drink alcohol.  No illicit drugs.  2. Risk factor reduction:  Advised patient of need for regular exercise and diet rich and fruits and vegetables to reduce risk of heart attack and stroke.  Exercise- golf when weather permits, active in his yard, had been doing some walking in summer - encouraged more regular exercise- he admits need to do better.  Diet/weight management-continues to eat healthy diet-weight stable within a few pounds last year-max weight 289 at prior practice  Wt Readings from Last 3 Encounters:  12/16/21 186 lb 12.8 oz (84.7 kg)  06/10/21 185 lb 12.8 oz (84.3 kg)  12/03/20 183 lb 6.4 oz (83.2 kg)  3. Immunizations/screenings/ancillary studies-fully up-to-date  Immunization History  Administered Date(s) Administered   Fluad Quad(high Dose 65+) 08/27/2019, 09/19/2020   Influenza Split 10/03/2011   Influenza Whole 08/29/2012   Influenza, High Dose Seasonal PF 08/17/2015, 08/17/2015, 09/01/2016, 08/22/2018, 08/27/2021   Influenza,inj,Quad PF,6+ Mos 09/05/2013, 09/08/2014, 09/19/2020   Influenza-Unspecified 08/17/2017, 08/28/2018   PFIZER Comirnaty(Gray Top)Covid-19 Tri-Sucrose Vaccine  05/28/2021   PFIZER(Purple Top)SARS-COV-2 Vaccination 01/07/2020, 01/28/2020, 08/25/2020   PNEUMOCOCCAL CONJUGATE-20 04/28/2021   Pfizer Covid-19 Vaccine Bivalent Booster 30yr & up 10/16/2021   Pneumococcal Conjugate-13 10/08/2013   Pneumococcal Polysaccharide-23 10/02/2009, 02/04/2015   Td 10/02/2009   Tdap 11/26/2019   Zoster Recombinat (Shingrix) 12/28/2018, 10/05/2019   Zoster, Live 12/04/2009  4. Prostate cancer screening-patient has wanted to continue PSA trend- Overall low risk trend last year-update PSA today-Stable nocturia noted Lab Results  Component Value Date   PSA 2.93 12/03/2020   PSA 2.56 11/20/2019   PSA 2.92 10/25/2018   5. Colon cancer screening - 05/22/15 with 10 year repeat planned with Dr. GCarlean Purl  6. Skin cancer screening- follows with Dr. Elvera Lennox for dermatology checks in may. advised regular sunscreen use. Denies worrisome, changing, or new skin lesions.  7. Smoking associated screening (lung cancer screening, AAA screen 65-75, UA)- former smoker-quit smoking in 1990.  Aneurysm screening picked up initial aneurysm and then later noted as only dilation without true aneurysm. We did opt to get UA at least every 5years- last 2022.  8. STD screening - not required as monogamous  Status of chronic or acute concerns   See problem oriented note about chest pain  #hypertension S: medication: Lisinopril 20 mg once daily -289 max weight at prior practice- had done an incredible job maintaining weight loss -at home did have one low with dehydration- improved with water BP Readings from Last 3 Encounters:  12/16/21 110/70  06/10/21 109/74  12/03/20 110/76  A/P: Excellent control-continue current medication  #nonobstructive CAD #hyperlipidemia- with coronary artery calcium 247 in 05/03/2018 64% for age S: Medication:Atorvastatin 20 mg once daily Lab Results  Component Value Date   CHOL 112 12/03/2020   HDL 49.10 12/03/2020   LDLCALC 49 12/03/2020   TRIG 71.0  12/03/2020   CHOLHDL 2 12/03/2020   A/P: Excellent control-update lipids and likely continue current medication  #Gout- uric acid has been at goal under 6 S: 0 flares in last year on no medication -patient did report he cut down on bacon in 2021 - wife helped to ration this and this seemed to help him Lab Results  Component Value Date   LABURIC 5.8 12/03/2020  A/P:hopefully stable- update uric acid today. Continue current meds for now as not having flare ups   # Aortic dilation/ aneurysm hx- Patient had this previously listed as aortic aneurysm - on repeat ultrasound in 2021 with no measurement over 3 cm - there was no code available for aortic dilation - planned to follow this due to prior report of aneurysm - likely to repeat 2-5 years from last imaging - he prefers to check now- ordered today  #Vertigo/BPPV-still has meclizine 25 mg on hand if needed - no recent issues- some mild orthostatic issues  #macular degeneration- close follow up with optho Dr. Jule Ser  #Ulcerative colitis- remote issues - patient thought was misdiagnosed- we planned to continue to monitor for recurrence. Years of remission if this was a true diagnosis  - no abdominal pain or rectal bleeding  #mild thrombocytopenia- chronic stable, other cell lines ok- trend at least annually - mild again last year Lab Results  Component Value Date   WBC 4.9 12/03/2020   HGB 14.7 12/03/2020   HCT 42.8 12/03/2020   MCV 85.9 12/03/2020   PLT 141.0 (L) 12/03/2020   Recommended follow up: Return in about 6 months (around 06/15/2022) for follow up- or sooner if needed. Future Appointments  Date Time Provider [place]  01/24/2022  9:30 AM [facility]   Lab/Order associations: fasting   ICD-10-CM   1. Encounter for general adult medical examination with abnormal findings  Z00.01     2. Hyperlipidemia, unspecified hyperlipidemia type  E78.5     3. Essential hypertension  I10     4. Idiopathic  gout, unspecified chronicity, unspecified site  M10.00     6. Ulcerative pancolitis without complication (HCC) Chronic K51.00     7. Abdominal aortic aneurysm (AAA) without rupture, unspecified part Chronic I71.40     8. Nocturia  R35.1     9. Coronary artery disease involving native coronary artery of  native heart without angina pectoris  I25.10      I,Jada Bradford,acting as a scribe for Garret Reddish, MD.,have documented all relevant documentation on the behalf of Garret Reddish, MD,as directed by  Garret Reddish, MD while in the presence of Garret Reddish, MD.  I, Garret Reddish, MD, have reviewed all documentation for this visit. The documentation on 12/16/21 for the exam, diagnosis, procedures, and orders are all accurate and complete.  Return precautions advised.  Garret Reddish, MD

## 2021-12-16 ENCOUNTER — Ambulatory Visit (INDEPENDENT_AMBULATORY_CARE_PROVIDER_SITE_OTHER): Payer: PPO | Admitting: Family Medicine

## 2021-12-16 ENCOUNTER — Encounter: Payer: Self-pay | Admitting: Family Medicine

## 2021-12-16 ENCOUNTER — Ambulatory Visit (INDEPENDENT_AMBULATORY_CARE_PROVIDER_SITE_OTHER)
Admission: RE | Admit: 2021-12-16 | Discharge: 2021-12-16 | Disposition: A | Discharge status: Home / Self Care | Payer: PPO | Source: Ambulatory Visit | Attending: Family Medicine | Admitting: Family Medicine

## 2021-12-16 ENCOUNTER — Other Ambulatory Visit: Payer: Self-pay

## 2021-12-16 VITALS — BP 110/70 | HR 64 | Temp 97.6°F | Ht 69.0 in | Wt 186.8 lb

## 2021-12-16 DIAGNOSIS — E785 Hyperlipidemia, unspecified: Secondary | ICD-10-CM | POA: Diagnosis not present

## 2021-12-16 DIAGNOSIS — K5100 Ulcerative (chronic) pancolitis without complications: Secondary | ICD-10-CM | POA: Diagnosis not present

## 2021-12-16 DIAGNOSIS — I2510 Atherosclerotic heart disease of native coronary artery without angina pectoris: Secondary | ICD-10-CM | POA: Diagnosis not present

## 2021-12-16 DIAGNOSIS — I10 Essential (primary) hypertension: Secondary | ICD-10-CM

## 2021-12-16 DIAGNOSIS — R0789 Other chest pain: Secondary | ICD-10-CM

## 2021-12-16 DIAGNOSIS — M1000 Idiopathic gout, unspecified site: Secondary | ICD-10-CM | POA: Diagnosis not present

## 2021-12-16 DIAGNOSIS — R351 Nocturia: Secondary | ICD-10-CM

## 2021-12-16 DIAGNOSIS — Z0000 Encounter for general adult medical examination without abnormal findings: Secondary | ICD-10-CM

## 2021-12-16 DIAGNOSIS — I714 Abdominal aortic aneurysm, without rupture, unspecified: Secondary | ICD-10-CM | POA: Diagnosis not present

## 2021-12-16 DIAGNOSIS — R079 Chest pain, unspecified: Secondary | ICD-10-CM | POA: Diagnosis not present

## 2021-12-16 DIAGNOSIS — Z0001 Encounter for general adult medical examination with abnormal findings: Secondary | ICD-10-CM

## 2021-12-16 LAB — COMPREHENSIVE METABOLIC PANEL
ALT: 8 U/L (ref 0–53)
AST: 13 U/L (ref 0–37)
Albumin: 4.5 g/dL (ref 3.5–5.2)
Alkaline Phosphatase: 66 U/L (ref 39–117)
BUN: 23 mg/dL (ref 6–23)
CO2: 29 mEq/L (ref 19–32)
Calcium: 9.3 mg/dL (ref 8.4–10.5)
Chloride: 104 mEq/L (ref 96–112)
Creatinine, Ser: 1.05 mg/dL (ref 0.40–1.50)
GFR: 70.87 mL/min (ref >60.00)
Glucose, Bld: 94 mg/dL (ref 70–99)
Potassium: 4.4 mEq/L (ref 3.5–5.1)
Sodium: 139 mEq/L (ref 135–145)
Total Bilirubin: 1 mg/dL (ref 0.2–1.2)
Total Protein: 6.5 g/dL (ref 6.0–8.3)

## 2021-12-16 LAB — CBC WITH DIFFERENTIAL/PLATELET
Basophils Absolute: 0 10*3/uL (ref 0.0–0.1)
Basophils Relative: 0.2 % (ref 0.0–3.0)
Eosinophils Absolute: 0 10*3/uL (ref 0.0–0.7)
Eosinophils Relative: 0.6 % (ref 0.0–5.0)
HCT: 39.1 % (ref 39.0–52.0)
Hemoglobin: 13.1 g/dL (ref 13.0–17.0)
Lymphocytes Relative: 9 % — ABNORMAL LOW (ref 12.0–46.0)
Lymphs Abs: 0.6 10*3/uL — ABNORMAL LOW (ref 0.7–4.0)
MCHC: 33.6 g/dL (ref 30.0–36.0)
MCV: 86 fl (ref 78.0–100.0)
Monocytes Absolute: 0.5 10*3/uL (ref 0.1–1.0)
Monocytes Relative: 7.9 % (ref 3.0–12.0)
Neutro Abs: 5.5 10*3/uL (ref 1.4–7.7)
Neutrophils Relative %: 82.3 % — ABNORMAL HIGH (ref 43.0–77.0)
Platelets: 148 10*3/uL — ABNORMAL LOW (ref 150.0–400.0)
RBC: 4.54 Mil/uL (ref 4.22–5.81)
RDW: 14.3 % (ref 11.5–15.5)
WBC: 6.7 10*3/uL (ref 4.0–10.5)

## 2021-12-16 LAB — LIPID PANEL
Cholesterol: 103 mg/dL (ref 0–200)
HDL: 47.8 mg/dL (ref >39.00)
LDL Cholesterol: 41 mg/dL (ref 0–99)
NonHDL: 54.97
Total CHOL/HDL Ratio: 2
Triglycerides: 72 mg/dL (ref 0.0–149.0)
VLDL: 14.4 mg/dL (ref 0.0–40.0)

## 2021-12-16 LAB — PSA: PSA: 2.68 ng/mL (ref 0.10–4.00)

## 2021-12-16 LAB — URIC ACID: Uric Acid, Serum: 6.5 mg/dL (ref 4.0–7.8)

## 2021-12-16 NOTE — Progress Notes (Signed)
Phone [phone] In person visit   Subjective:   Edward Goodwin is a 71 y.o. year old very pleasant male patient who presents for/with See problem oriented charting   This visit occurred during the SARS-CoV-2 public health emergency.  Safety protocols were in place, including screening questions prior to the visit, additional usage of staff PPE, and extensive cleaning of exam room while observing appropriate contact time as indicated for disinfecting solutions.   Past Medical History-  Patient Active Problem List   Diagnosis Date Noted   nonobstructive CAD (coronary artery disease)- CAC 247 on 05/03/2018  12/16/2021    Priority: High   Abdominal aortic aneurysm (AAA) without rupture, unspecified part     Priority: High   Ulcerative colitis ([place]) 08/20/2007    Priority: High   Former smoker 04/19/2018    Priority: Medium    Hyperlipidemia 04/19/2018    Priority: Medium    Vertigo 12/19/2017    Priority: Medium    Gout 03/24/2014    Priority: Medium    Elevated PSA, less than 10 ng/ml 09/09/2013    Priority: Medium    Macular degeneration, bilateral 07/15/2012    Priority: Medium    Obesity (BMI 30.0-34.9) 12/02/2008    Priority: Medium    Essential hypertension 08/20/2007    Priority: Medium    PSORIASIS 08/20/2007    Priority: Medium    Onychomycosis 12/19/2017    Priority: Low   Trochanteric bursitis of left hip 03/31/2015    Priority: Low    Medications- reviewed and updated Current Outpatient Medications  Medication Sig Dispense Refill   Ascorbic Acid (VITAMIN C PO) Take 1 tablet by mouth daily. 1000 mg     atorvastatin (LIPITOR) 20 MG tablet TAKE 1 TABLET BY MOUTH EVERY DAY 90 tablet 3   clobetasol ointment (TEMOVATE) 0.05 % Apply topically 2 (two) times daily as needed.     L-Lysine 1000 MG TABS Take 500 mg by mouth daily.      lisinopril (ZESTRIL) 20 MG tablet TAKE 1 TABLET BY MOUTH EVERY DAY 90 tablet 1   meclizine (ANTIVERT) 25 MG tablet Take 1 tablet (25  mg total) by mouth every 6 (six) hours as needed for dizziness. 60 tablet 2   Multiple Vitamins-Minerals (PRESERVISION AREDS 2) CAPS Take 1 capsule by mouth 2 (two) times daily before lunch and supper.     No current facility-administered medications for this visit.     Objective:  BP 110/70    Pulse 64    Temp 97.6 F (36.4 C)    Ht 5' 9"  (1.753 m)    Wt 186 lb 12.8 oz (84.7 kg)    SpO2 98%    BMI 27.59 kg/m  Gen: NAD, resting comfortably No chest wall pain with palpation or reproducible pain with deep breath  EKG: sinus bradycardia with rate 56, normal axis, normal intervals, no hypertrophy, no st or t wave changes    Assessment and Plan   # Chest pain in patient with nonobstructive CAD S:patient reports chest pain from yesterday that lasted most of the day. Was worse with deep breath and sometimes with movement. Mild in left upper chest. Today symptoms are pretty much gone except for one deep breath earlier today. Was not worse with going up stairs/exerting himself. No recent fall/injury/heavy lifting.  Just started suddenly when got home from breakfast and took deep breath. No dizziness or palpitations  Longer standing right neck issues for last week- biofreeze helps and improving. No left  arm or neck pain. No shortness of breath. No diaphoresis.     No issue with deep breath in office today A/P: atypical  chest pain in patient wit nonobstructive CAD- not exertional, not relieved by rest. Essentially resolved today after 1 day of pain with deep breath- no symptoms today. No history of dvt/PE. Could be muscular.  - ekg largely reassuring -ct cardiac scoring-with coronary artery calcium 247 in 05/03/2018 64% for age  - offered cardiology appointment but declines -does agree to chest x-ray- doubt pneumothorax but reasonable to check - We verbally discussed if new or worsening symptoms to seek care immediately- particularly worsening chest pain or shortness of breath  Recommended follow  up: ad needed for this acute concern Future Appointments  Date Time Provider [place]  01/24/2022  9:30 AM [facility]    Lab/Order associations:   ICD-10-CM   1. Chest discomfort  R07.89 EKG 12-Lead    DG Chest 2 View     Return precautions advised.  Garret Reddish, MD

## 2021-12-16 NOTE — Patient Instructions (Addendum)
Please stop by lab before you go If you have mychart- we will send your results within 3 business days of [place] receiving them.  If you do not have mychart- we will call you about results within 5 business days of [place] receiving them.  *please also note that you will see labs on mychart as soon as they post. I will later go in and write notes on them- will say "notes from Dr. Yong Channel"   Please go to [facility] (updated 01/09/2020) - located [address] across the street from [place] - in the basement - Hours: 8:30-5:00 PM M-F (with lunch from 12:30- 1 PM). You do NOT need an appointment.   We will call you within two weeks about your referral to aneurysm follow up . If you do not hear within 2 weeks, give [place] a call.    Recommended follow up: Return in about 6 months (around 06/15/2022) for follow up- or sooner if needed. We verbally discussed if new or worsening symptoms to seek care immediately- particularly worsening chest pain or shortness of breath

## 2021-12-30 ENCOUNTER — Ambulatory Visit (HOSPITAL_COMMUNITY)
Admission: RE | Admit: 2021-12-30 | Discharge: 2021-12-30 | Disposition: A | Discharge status: Home / Self Care | Payer: PPO | Source: Ambulatory Visit | Attending: Family Medicine | Admitting: Family Medicine

## 2021-12-30 ENCOUNTER — Other Ambulatory Visit: Payer: Self-pay

## 2021-12-30 DIAGNOSIS — I714 Abdominal aortic aneurysm, without rupture, unspecified: Secondary | ICD-10-CM | POA: Diagnosis not present

## 2022-01-05 DIAGNOSIS — H353212 Exudative age-related macular degeneration, right eye, with inactive choroidal neovascularization: Secondary | ICD-10-CM | POA: Diagnosis not present

## 2022-01-24 ENCOUNTER — Ambulatory Visit: Payer: PPO

## 2022-01-25 DIAGNOSIS — H43812 Vitreous degeneration, left eye: Secondary | ICD-10-CM | POA: Diagnosis not present

## 2022-01-25 DIAGNOSIS — H353211 Exudative age-related macular degeneration, right eye, with active choroidal neovascularization: Secondary | ICD-10-CM | POA: Diagnosis not present

## 2022-01-25 DIAGNOSIS — H353122 Nonexudative age-related macular degeneration, left eye, intermediate dry stage: Secondary | ICD-10-CM | POA: Diagnosis not present

## 2022-01-27 ENCOUNTER — Ambulatory Visit (INDEPENDENT_AMBULATORY_CARE_PROVIDER_SITE_OTHER): Payer: PPO

## 2022-01-27 ENCOUNTER — Other Ambulatory Visit: Payer: Self-pay

## 2022-01-27 DIAGNOSIS — Z0000 Encounter for general adult medical examination without abnormal findings: Secondary | ICD-10-CM

## 2022-01-27 NOTE — Patient Instructions (Addendum)
Edward Goodwin , ?Thank you for taking time to come for your Medicare Wellness Visit. I appreciate your ongoing commitment to your health goals. Please review the following plan we discussed and let me know if I can assist you in the future.  ? ?Screening recommendations/referrals: ?Colonoscopy: Done 05/22/15 repeat every 10 years  ?Recommended yearly ophthalmology/optometry visit for glaucoma screening and checkup ?Recommended yearly dental visit for hygiene and checkup ? ?Vaccinations: ?Influenza vaccine: Done 08/17/21 repeat every year  ?Pneumococcal vaccine: Up to date ?Tdap vaccine: Done 06/08/21 repeat every 10 years  ?Shingles vaccine: Completed 2/14, 10/05/19   ?Covid-19: Completed 2/23, 3/16, 08/25/20, 7/15, 10/16/21 ? ?Advanced directives: Copies in chart  ? ?Conditions/risks identified: None at this time  ? ?Next appointment: Follow up in one year for your annual wellness visit.  ? ?Preventive Care 73 Years and Older, Male ?Preventive care refers to lifestyle choices and visits with your health care provider that can promote health and wellness. ?What does preventive care include? ?A yearly physical exam. This is also called an annual well check. ?Dental exams once or twice a year. ?Routine eye exams. Ask your health care provider how often you should have your eyes checked. ?Personal lifestyle choices, including: ?Daily care of your teeth and gums. ?Regular physical activity. ?Eating a healthy diet. ?Avoiding tobacco and drug use. ?Limiting alcohol use. ?Practicing safe sex. ?Taking low doses of aspirin every day. ?Taking vitamin and mineral supplements as recommended by your health care provider. ?What happens during an annual well check? ?The services and screenings done by your health care provider during your annual well check will depend on your age, overall health, lifestyle risk factors, and family history of disease. ?Counseling  ?Your health care provider may ask you questions about your: ?Alcohol  use. ?Tobacco use. ?Drug use. ?Emotional well-being. ?Home and relationship well-being. ?Sexual activity. ?Eating habits. ?History of falls. ?Memory and ability to understand (cognition). ?Work and work Statistician. ?Screening  ?You may have the following tests or measurements: ?Height, weight, and BMI. ?Blood pressure. ?Lipid and cholesterol levels. These may be checked every 5 years, or more frequently if you are over 73 years old. ?Skin check. ?Lung cancer screening. You may have this screening every year starting at age 73 if you have a 30-pack-year history of smoking and currently smoke or have quit within the past 15 years. ?Fecal occult blood test (FOBT) of the stool. You may have this test every year starting at age 73. ?Flexible sigmoidoscopy or colonoscopy. You may have a sigmoidoscopy every 5 years or a colonoscopy every 10 years starting at age 73. ?Prostate cancer screening. Recommendations will vary depending on your family history and other risks. ?Hepatitis C blood test. ?Hepatitis B blood test. ?Sexually transmitted disease (STD) testing. ?Diabetes screening. This is done by checking your blood sugar (glucose) after you have not eaten for a while (fasting). You may have this done every 1-3 years. ?Abdominal aortic aneurysm (AAA) screening. You may need this if you are a current or former smoker. ?Osteoporosis. You may be screened starting at age 73 if you are at high risk. ?Talk with your health care provider about your test results, treatment options, and if necessary, the need for more tests. ?Vaccines  ?Your health care provider may recommend certain vaccines, such as: ?Influenza vaccine. This is recommended every year. ?Tetanus, diphtheria, and acellular pertussis (Tdap, Td) vaccine. You may need a Td booster every 10 years. ?Zoster vaccine. You may need this after age 73. ?Pneumococcal 13-valent  conjugate (PCV13) vaccine. One dose is recommended after age 65. ?Pneumococcal polysaccharide  (PPSV23) vaccine. One dose is recommended after age 73. ?Talk to your health care provider about which screenings and vaccines you need and how often you need them. ?This information is not intended to replace advice given to you by your health care provider. Make sure you discuss any questions you have with your health care provider. ?Document Released: 11/27/2015 Document Revised: 07/20/2016 Document Reviewed: 09/01/2015 ?Elsevier Interactive Patient Education ? 2017 [employer]. ? ?Fall Prevention in the Home ?Falls can cause injuries. They can happen to people of all ages. There are many things you can do to make your home safe and to help prevent falls. ?What can I do on the outside of my home? ?Regularly fix the edges of walkways and driveways and fix any cracks. ?Remove anything that might make you trip as you walk through a door, such as a raised step or threshold. ?Trim any bushes or trees on the path to your home. ?Use bright outdoor lighting. ?Clear any walking paths of anything that might make someone trip, such as rocks or tools. ?Regularly check to see if handrails are loose or broken. Make sure that both sides of any steps have handrails. ?Any raised decks and porches should have guardrails on the edges. ?Have any leaves, snow, or ice cleared regularly. ?Use sand or salt on walking paths during winter. ?Clean up any spills in your garage right away. This includes oil or grease spills. ?What can I do in the bathroom? ?Use night lights. ?Install grab bars by the toilet and in the tub and shower. Do not use towel bars as grab bars. ?Use non-skid mats or decals in the tub or shower. ?If you need to sit down in the shower, use a plastic, non-slip stool. ?Keep the floor dry. Clean up any water that spills on the floor as soon as it happens. ?Remove soap buildup in the tub or shower regularly. ?Attach bath mats securely with double-sided non-slip rug tape. ?Do not have throw rugs and other things on the  floor that can make you trip. ?What can I do in the bedroom? ?Use night lights. ?Make sure that you have a light by your bed that is easy to reach. ?Do not use any sheets or blankets that are too big for your bed. They should not hang down onto the floor. ?Have a firm chair that has side arms. You can use this for support while you get dressed. ?Do not have throw rugs and other things on the floor that can make you trip. ?What can I do in the kitchen? ?Clean up any spills right away. ?Avoid walking on wet floors. ?Keep items that you use a lot in easy-to-reach places. ?If you need to reach something above you, use a strong step stool that has a grab bar. ?Keep electrical cords out of the way. ?Do not use floor polish or wax that makes floors slippery. If you must use wax, use non-skid floor wax. ?Do not have throw rugs and other things on the floor that can make you trip. ?What can I do with my stairs? ?Do not leave any items on the stairs. ?Make sure that there are handrails on both sides of the stairs and use them. Fix handrails that are broken or loose. Make sure that handrails are as long as the stairways. ?Check any carpeting to make sure that it is firmly attached to the stairs. Fix any carpet that  is loose or worn. ?Avoid having throw rugs at the top or bottom of the stairs. If you do have throw rugs, attach them to the floor with carpet tape. ?Make sure that you have a light switch at the top of the stairs and the bottom of the stairs. If you do not have them, ask someone to add them for you. ?What else can I do to help prevent falls? ?Wear shoes that: ?Do not have high heels. ?Have rubber bottoms. ?Are comfortable and fit you well. ?Are closed at the toe. Do not wear sandals. ?If you use a stepladder: ?Make sure that it is fully opened. Do not climb a closed stepladder. ?Make sure that both sides of the stepladder are locked into place. ?Ask someone to hold it for you, if possible. ?Clearly mark and make  sure that you can see: ?Any grab bars or handrails. ?First and last steps. ?Where the edge of each step is. ?Use tools that help you move around (mobility aids) if they are needed. These include: ?Canes. ?Walkers. ?Sco

## 2022-01-27 NOTE — Progress Notes (Addendum)
Virtual Visit via Telephone Note ? ?I connected with  [name] on 01/27/22 at  2:30 PM EDT by telephone and verified that I am speaking with the correct person using two identifiers. ? ?Medicare Annual Wellness visit completed telephonically due to Covid-19 pandemic.  ? ?Persons participating in this call: This Health Coach and this patient.  ? ?Location: ?Patient: home ?Provider: office ?  ?I discussed the limitations, risks, security and privacy concerns of performing an evaluation and management service by telephone and the availability of in person appointments. The patient expressed understanding and agreed to proceed. ? ?Unable to perform video visit due to video visit attempted and failed and/or patient does not have video capability.  ? ?Some vital signs may be absent or patient reported.  ? ?Willette Brace, LPN ? ? ?Subjective:  ? Koah Chisenhall is a 71 y.o. male who presents for Medicare Annual/Subsequent preventive examination. ? ?Review of Systems    ? ?Cardiac Risk Factors include: advanced age (>51mn, >>44women);hypertension;male gender;dyslipidemia ? ?   ?Objective:  ?  ?There were no vitals filed for this visit. ?There is no height or weight on file to calculate BMI. ? ?Advanced Directives 01/27/2022 01/11/2021 12/09/2019 12/05/2018 04/08/2017 05/22/2015 05/08/2015  ?Does Patient Have a Medical Advance Directive? Yes Yes Yes Yes Yes Yes Yes  ?Type of Advance Directive Healthcare Power of A[place]aleLiving will H[place]Living will H[place]Living will [place]Living will  ?Does patient want to make changes to medical advance directive? - - No - Patient declined No - Patient declined - - No - Patient declined  ?Copy of [place] Chart? Yes - validated most recent copy scanned in chart (See row information) Yes - validated  most recent copy scanned in chart (See row information) Yes - validated most recent copy scanned in chart (See row information) Yes - validated most recent copy scanned in chart (See row information) No - copy requested - No - copy requested  ? ? ?Current Medications (verified) ?Outpatient Encounter Medications as of 01/27/2022  ?Medication Sig  ? Ascorbic Acid (VITAMIN C PO) Take 1 tablet by mouth daily. 1000 mg  ? atorvastatin (LIPITOR) 20 MG tablet TAKE 1 TABLET BY MOUTH EVERY DAY  ? clobetasol ointment (TEMOVATE) 0.05 % Apply topically 2 (two) times daily as needed.  ? L-Lysine 1000 MG TABS Take 500 mg by mouth daily.   ? lisinopril (ZESTRIL) 20 MG tablet TAKE 1 TABLET BY MOUTH EVERY DAY  ? meclizine (ANTIVERT) 25 MG tablet Take 1 tablet (25 mg total) by mouth every 6 (six) hours as needed for dizziness.  ? Multiple Vitamins-Minerals (PRESERVISION AREDS 2) CAPS Take 1 capsule by mouth 2 (two) times daily before lunch and supper.  ? FLUZONE HIGH-DOSE QUADRIVALENT 0.7 ML SUSY   ? PFIZER COVID-19 VAC BIVALENT injection   ? ?No facility-administered encounter medications on file as of 01/27/2022.  ? ? ?Allergies (verified) ?Sulfonamide derivatives  ? ?History: ?Past Medical History:  ?Diagnosis Date  ? Chicken pox   ? [place]ative [place]   ? Hypertension   ? Macular degeneration, bilateral 07/2012  ? areds 2   ? Psoriasis   ? ?Past Surgical History:  ?Procedure Laterality Date  ? ACNE CYST REMOVAL    ? CATARACT EXTRACTION W/ INTRAOCULAR LENS IMPLANT  10/2014  ? bilat  ? COLONOSCOPY    ?  FOOT SURGERY  1988  ? SHOULDER ARTHROSCOPY  2007  ? Right  ? TONSILLECTOMY    ? ?Family History  ?Problem Relation Age of Onset  ? Heart disease Mother   ? Breast cancer Mother   ? Hypertension Mother   ? Stroke Mother   ? Hypothyroidism Mother   ? Dementia Mother   ? Heart disease Father   ?     CAD/MI 49s or 84s smoker. led to death age 30.   ? Hypertension Father   ? Diabetes Daughter   ? Diabetes Maternal Uncle   ? Hepatitis  C Brother   ? Stroke Maternal Grandmother   ? Emphysema Maternal Grandfather   ? Arthritis Paternal Grandmother   ? Colon cancer Neg Hx   ? Esophageal cancer Neg Hx   ? Stomach cancer Neg Hx   ? Rectal cancer Neg Hx   ? ?Social History  ? ?Socioeconomic History  ? Marital status: Married  ?  Spouse name: Not on file  ? Number of children: 1  ? Years of education: 51  ? Highest education level: Not on file  ?Occupational History  ? Occupation: Herbalist  ?  Comment: retired   ?Tobacco Use  ? Smoking status: Former  ?  Types: Cigarettes  ?  Quit date: 10/23/1989  ?  Years since quitting: 32.2  ? Smokeless tobacco: Never  ?Vaping Use  ? Vaping Use: Never used  ?Substance and Sexual Activity  ? Alcohol use: Yes  ?  Alcohol/week: 0.0 standard drinks  ?  Comment: rare glass of wine - less than once a year  ? Drug use: No  ? Sexual activity: Not Currently  ?Other Topics Concern  ? Not on file  ?Social History Narrative  ? Married 1971. I daughter 11; 1 grandchild 2008 - daughter divorced 2010  ?   ? 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  ? 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 [employer] 6 years.   ?   ? ACP: HCPOA - wife. Yes for acute CPR, no for prolonged mechanical ventilatory support, no for prolonged artificial nutrition or other heroic measures leaving him in an incapacitated state.   ? ?Social Determinants of Health  ? ?Financial Resource Strain: Low Risk   ? Difficulty of Paying Living Expenses: Not hard at all  ?Food Insecurity: No Food Insecurity  ? Worried About Charity fundraiser in the Last Year: Never true  ? Ran Out of Food in the Last Year: Never true  ?Transportation Needs: No Transportation Needs  ? Lack of Transportation (Medical): No  ? Lack of Transportation (Non-Medical): No  ?Physical Activity: Sufficiently Active  ? Days of Exercise per Week: 2 days  ? Minutes of Exercise per Session: 150+ min  ?Stress: No Stress Concern Present  ? Feeling of Stress : Not at all  ?Social  Connections: Moderately Integrated  ? Frequency of Communication with Friends and Family: More than three times a week  ? Frequency of Social Gatherings with Friends and Family: More than three times a week  ? Attends Religious Services: More than 4 times per year  ? Active Member of Clubs or Organizations: No  ? Attends Archivist Meetings: Never  ? Marital Status: Married  ? ? ?Tobacco Counseling ?Counseling given: Not Answered ? ? ?Clinical Intake: ? ?Pre-visit preparation completed: Yes ? ?Pain : No/denies pain ? ?  ? ?BMI - recorded: 27.59 ?Nutritional Status: BMI 25 -29 Overweight ?Nutritional  Risks: None ?Diabetes: No ? ?How often do you need to have someone help you when you read instructions, pamphlets, or other written materials from your doctor or pharmacy?: 1 - Never ? ?Diabetic?No ? ?Interpreter Needed?: No ? ?Information entered by :: Charlott Rakes, LPN ? ? ?Activities of Daily Living ?In your present state of health, do you have any difficulty performing the following activities: 01/27/2022  ?Hearing? N  ?Vision? N  ?Difficulty concentrating or making decisions? N  ?Walking or climbing stairs? N  ?Dressing or bathing? N  ?Doing errands, shopping? N  ?Preparing Food and eating ? N  ?Using the Toilet? N  ?In the past six months, have you accidently leaked urine? N  ?Do you have problems with loss of bowel control? N  ?Managing your Medications? N  ?Managing your Finances? N  ?Housekeeping or managing your Housekeeping? N  ?Some recent data might be hidden  ? ? ?Patient Care Team: ?[name], MD as PCP - General (Family Medicine) ?[place], Heather, [place] as Consulting Physician (Optometry) ?Harriett Sine, MD as Consulting Physician (Dermatology) ?Princess Bruins, MD as Consulting Physician (Ophthalmology) ?Madelin Rear, [facility] as Pharmacist (Pharmacist) ? ?Indicate any recent Medical Services you may have received from other than [facility] providers in the past year (date may be approximate). ? ?    ?Assessment:  ? This is a routine wellness examination for Loye. ? ?Hearing/Vision screen ?Hearing Screening - Comments:: Pt wears hearing aids  ?Vision Screening - Comments:: Pt follows up with Dr Myrle Sheng

## 2022-02-09 ENCOUNTER — Encounter: Payer: Self-pay | Admitting: Family Medicine

## 2022-02-09 ENCOUNTER — Ambulatory Visit (INDEPENDENT_AMBULATORY_CARE_PROVIDER_SITE_OTHER)
Admission: RE | Admit: 2022-02-09 | Discharge: 2022-02-09 | Disposition: A | Discharge status: Home / Self Care | Payer: PPO | Source: Ambulatory Visit | Attending: Family Medicine | Admitting: Family Medicine

## 2022-02-09 ENCOUNTER — Other Ambulatory Visit: Payer: Self-pay

## 2022-02-09 ENCOUNTER — Ambulatory Visit (INDEPENDENT_AMBULATORY_CARE_PROVIDER_SITE_OTHER): Payer: PPO | Admitting: Family Medicine

## 2022-02-09 VITALS — BP 104/58 | HR 62 | Temp 97.9°F | Ht 69.0 in | Wt 188.2 lb

## 2022-02-09 DIAGNOSIS — E785 Hyperlipidemia, unspecified: Secondary | ICD-10-CM | POA: Diagnosis not present

## 2022-02-09 DIAGNOSIS — I10 Essential (primary) hypertension: Secondary | ICD-10-CM

## 2022-02-09 DIAGNOSIS — M542 Cervicalgia: Secondary | ICD-10-CM | POA: Diagnosis not present

## 2022-02-09 MED ORDER — CYCLOBENZAPRINE HCL 5 MG PO TABS: 5.0000 mg | ORAL_TABLET | Freq: Every evening | ORAL | 0 refills | Status: DC | PRN

## 2022-02-09 NOTE — Patient Instructions (Addendum)
Please go to [place]  central X-ray (updated 01/09/2020) ?- located [address] across the street from [place] ?- in the basement ?- Hours: 8:30-5:00 PM M-F (with lunch from 12:30- 1 PM). You do NOT need an appointment.  ?- Please ensure you are covid symptom free before going in(No fever, chills, cough, congestion, runny nose, shortness of breath, fatigue, body aches, sore throat, headache, nausea, vomiting, diarrhea, or new loss of taste or smell. No known contacts with covid 19 or someone being tested for covid 19) ? ?If x-rays are ok proceed with physical therapy (schedule at desk before you go)  ? ?If no improvement with PT and flexeril/muscle relaxant before bed within several sessions- lets refer you to sports medicine ? ?Recommended follow up: Return for as needed for new, worsening, persistent symptoms. ? ? ?

## 2022-02-09 NOTE — Progress Notes (Signed)
? ?Phone [phone] ?In person visit ?  ?Subjective:  ? ?Edward Goodwin is a 71 y.o. year old very pleasant male patient who presents for/with See problem oriented charting ?Chief Complaint  ?Patient presents with  ? Follow-up  ?  Pt is here to f/u on neck pain and states the pain is still there. The bio freeze gave temporary releif but when he turns his head he can feel the pain more.   ? ?This visit occurred during the SARS-CoV-2 public health emergency.  Safety protocols were in place, including screening questions prior to the visit, additional usage of staff PPE, and extensive cleaning of exam room while observing appropriate contact time as indicated for disinfecting solutions.  ? ?Past Medical History-  ?Patient Active Problem List  ? Diagnosis Date Noted  ? nonobstructive CAD (coronary artery disease)- CAC 247 on 05/03/2018  12/16/2021  ? Abdominal aortic aneurysm (AAA) without rupture, unspecified part   ? Former smoker 04/19/2018  ? Hyperlipidemia 04/19/2018  ? Vertigo 12/19/2017  ? Onychomycosis 12/19/2017  ? Trochanteric bursitis of left hip 03/31/2015  ? Gout 03/24/2014  ? Elevated PSA, less than 10 ng/ml 09/09/2013  ? Macular degeneration, bilateral 07/15/2012  ? Obesity (BMI 30.0-34.9) 12/02/2008  ? Essential hypertension 08/20/2007  ? Ulcerative colitis ([place]) 08/20/2007  ? PSORIASIS 08/20/2007  ? ? ?Medications- reviewed and updated ?Current Outpatient Medications  ?Medication Sig Dispense Refill  ? Ascorbic Acid (VITAMIN C PO) Take 1 tablet by mouth daily. 1000 mg    ? atorvastatin (LIPITOR) 20 MG tablet TAKE 1 TABLET BY MOUTH EVERY DAY 90 tablet 3  ? clobetasol ointment (TEMOVATE) 0.05 % Apply topically 2 (two) times daily as needed.    ? L-Lysine 1000 MG TABS Take 500 mg by mouth daily.     ? lisinopril (ZESTRIL) 20 MG tablet TAKE 1 TABLET BY MOUTH EVERY DAY 90 tablet 1  ? meclizine (ANTIVERT) 25 MG tablet Take 1 tablet (25 mg total) by mouth every 6 (six) hours as needed for dizziness. 60  tablet 2  ? Multiple Vitamins-Minerals (PRESERVISION AREDS 2) CAPS Take 1 capsule by mouth 2 (two) times daily before lunch and supper.    ? ?No current facility-administered medications for this visit.  ? ?  ?Objective:  ?BP (!) 104/58   Pulse 62   Temp 97.9 ?F (36.6 ?C)   Ht 5' 9"  (1.753 m)   Wt 188 lb 3.2 oz (85.4 kg)   SpO2 97%   BMI 27.79 kg/m?  ?Gen: NAD, resting comfortably ?CV: RRR no murmurs rubs or gallops ?Lungs: CTAB no crackles, wheeze, rhonchi ?Skin: warm, dry ?Neuro: Good strength in upper extremities with no numbness or tingling noted-good gross sensation ?Musculoskeletal: No midline tenderness-no paraspinous muscle tenderness except with turning head in either direction-also note limited range of motion to about 45 degrees on the right ?  ? ?Assessment and Plan  ? ?# Pain in neck  ?S: Patient had mentioned right neck pain during physical on 12/16/2021 as part of review of systems. ? ?Pain started mid January- over 2 months at this point. At rest can barely feel it- feels tension if turns to right or left. Varies in intensity. He moans when he turns in his sleep at times- does not wake him up but wakes wife up. If he turns over before he falls asleep notes significant discomfort. Pain is all in the right side of the neck . No history of similar. Biofreeze is slightly helpful- temporary relief. Motrin with  some relief. No issues with golf interestingly enough. No ardiating pain into arm or shoulder.  Most of the time pain is mild to moderate but in the mornings can be severe or at night though does not wake him from sleep. ? ?-No prior cervical spine films ?- no more chest pain from last month. CXR reassuring ?-claustrophobic so doesn't want MRI ? ?ROS-No saddle anesthesia, worsening bladder incontinence, worseningfecal incontinence, weakness in extremity- upper or lower, numbness or tingling in extremity. History negative for trauma, history of cancer, fever, chills, unintentional weight loss,  recent bacterial infection, recent IV drug use, HIV, pain worse at night or while supine if hes still-does have issues with turning over ? ?A/P: 71 year old male with right-sided neck pain concerning for cervicalgia.  We will get cervical spine films given duration to make sure no bony abnormalities or other significant findings. ?- We will also trial physical therapy ?- Also trial muscle relaxant before bed-seems like nighttime and in the morning is his worst time of the day ?-We discussed if no improvement with the above efforts within a few PT sessions or weeks that we would refer to sports medicine ? ?#hypertension ?S: medication: Lisinopril 20 mg once daily ?-289 max weight at prior practice- had done an incredible job maintaining weight loss: ?Home readings #s: still getting some highs at home- highest he has seen is 160- usually in 110s -130s though ?BP Readings from Last 3 Encounters:  ?02/09/22 (!) 104/58  ?12/16/21 110/70  ?06/10/21 109/74  ?A/P: running slightly lower than normal- at home sometimes still runs high so we opted not to make adjustments- would consider resonably well controlled ? ?#hyperlipidemia with coronary artery calcium 247 in 05/03/2018 64% for age ?S: Medication:Atorvastatin 20 mg once daily ?Lab Results  ?Component Value Date  ? CHOL 103 12/16/2021  ? HDL 47.80 12/16/2021  ? [place] 41 12/16/2021  ? TRIG 72.0 12/16/2021  ? CHOLHDL 2 12/16/2021  ? A/P: excellent control - continue current medicine ? ?Recommended follow up: No follow-ups on file. ?Future Appointments  ?Date Time Provider [place]  ?06/15/2022  8:40 AM Yong Channel, Brayton Mars, MD [facility]  ? ? ?Lab/Order associations: ?No diagnosis found. ? ?No orders of the defined types were placed in this encounter. ? ? ?I,Jada Bradford,acting as a scribe for Garret Reddish, MD.,have documented all relevant documentation on the behalf of Garret Reddish, MD,as directed by  Garret Reddish, MD while in the presence of Garret Reddish,  MD. ? ?I, Garret Reddish, MD, have reviewed all documentation for this visit. The documentation on 02/09/22 for the exam, diagnosis, procedures, and orders are all accurate and complete. ? ?Return precautions advised.  ?Burnett Corrente ? ? ?

## 2022-02-15 DIAGNOSIS — H353211 Exudative age-related macular degeneration, right eye, with active choroidal neovascularization: Secondary | ICD-10-CM | POA: Diagnosis not present

## 2022-02-15 DIAGNOSIS — H43812 Vitreous degeneration, left eye: Secondary | ICD-10-CM | POA: Diagnosis not present

## 2022-02-15 DIAGNOSIS — H353122 Nonexudative age-related macular degeneration, left eye, intermediate dry stage: Secondary | ICD-10-CM | POA: Diagnosis not present

## 2022-02-15 DIAGNOSIS — H35033 Hypertensive retinopathy, bilateral: Secondary | ICD-10-CM | POA: Diagnosis not present

## 2022-02-15 NOTE — Therapy (Signed)
?OUTPATIENT PHYSICAL THERAPY CERVICAL EVALUATION ? ? ?Patient Name: Edward Goodwin ?MRN: [id number] ?DOB:1951-02-16, 71 y.o., male ?Today's Date: 02/16/2022 ? ? PT End of Session - 02/16/22 1133   ? ? Visit Number 1   ? Number of Visits 12   ? Date for PT Re-Evaluation 03/30/22   ? Authorization Type HTA   ? PT Start Time 0845   ? PT Stop Time 0925   ? PT Time Calculation (min) 40 min   ? Activity Tolerance Patient tolerated treatment well   ? Behavior During Therapy [facility] for tasks assessed/performed   ? ?  ?  ? ?  ? ? ?Past Medical History:  ?Diagnosis Date  ? Chicken pox   ? Colitis, ulcerative ([place])   ? Hypertension   ? Macular degeneration, bilateral 07/2012  ? areds 2   ? Psoriasis   ? ?Past Surgical History:  ?Procedure Laterality Date  ? ACNE CYST REMOVAL    ? CATARACT EXTRACTION W/ INTRAOCULAR LENS IMPLANT  10/2014  ? bilat  ? COLONOSCOPY    ? FOOT SURGERY  1988  ? SHOULDER ARTHROSCOPY  2007  ? Right  ? TONSILLECTOMY    ? ?Patient Active Problem List  ? Diagnosis Date Noted  ? nonobstructive CAD (coronary artery disease)- CAC 247 on 05/03/2018  12/16/2021  ? Abdominal aortic aneurysm (AAA) without rupture, unspecified part ([place])   ? Former smoker 04/19/2018  ? Hyperlipidemia 04/19/2018  ? Vertigo 12/19/2017  ? Onychomycosis 12/19/2017  ? Trochanteric bursitis of left hip 03/31/2015  ? Gout 03/24/2014  ? Elevated PSA, less than 10 ng/ml 09/09/2013  ? Macular degeneration, bilateral 07/15/2012  ? Obesity (BMI 30.0-34.9) 12/02/2008  ? Essential hypertension 08/20/2007  ? Ulcerative colitis ([place]) 08/20/2007  ? PSORIASIS 08/20/2007  ? ? ?PCP: [name], MD ? ?REFERRING PROVIDER: [name], MD ? ?REFERRING DIAG: M54.2 (ICD-10-CM) - Neck pain on right side ? ?THERAPY DIAG:  ?Cervicalgia ? ?ONSET DATE: 2 months ago ? ?SUBJECTIVE:                                                                                                                                                                                                         ? ?SUBJECTIVE STATEMENT: ?Pain for about 2 months. Increased pain with neck rotation, worst pain in AM, better during the day. States minimal pain unless he is turning is head.  ? ?PERTINENT HISTORY:  ?None ? ?PAIN:  ?Are you having pain? Yes: NPRS scale: 2/10 up to 10/10 in AM:  10/10 ?Pain location: R neck/UT ?  Pain description: aching, sore ?Aggravating factors: Am, rotation ?Relieving factors: none stated  ? ?PRECAUTIONS: None ? ?WEIGHT BEARING RESTRICTIONS No ? ?FALLS:  ?Has patient fallen in last 6 months? No ? ?OCCUPATION: retired, likes to golf 2x/wk  ? ?PLOF: Independent ? ?PATIENT GOALS  decreased pain in neck with rotation ? ?OBJECTIVE:  ? ?DIAGNOSTIC FINDINGS:  ?Cervical xray: 01/2922 ?MPRESSION: ?No recent fracture is seen. Cervical spondylosis with large anterior ?bony spurs from C4-T1 levels. There is encroachment of neural ?foramina by bony spurs and facet hypertrophy at C4-C5 and C5-C6 ?levels. ? ?COGNITION: ?Overall cognitive status: Within functional limits for tasks assessed ? ? ?POSTURE:  ?Forward head  ? ?PALPATION: ?Hypomobile c-spine , minimal muscular tightness or pain ? ?CERVICAL ROM:  ? ?Active ROM A/PROM (deg) ?02/16/2022  ?Flexion Mild limitation  ?Extension Mod limitation  ?Right lateral flexion Sig limitation  ?Left lateral flexion Sig limitation  ?Right rotation 45  ?Left rotation 60  ? (Blank rows = not tested) ? ? ? UE Measurements ?UE: ROM: WFL, mild limitation for shoulder flexion ?Strength: 4+/5 ? ? ?TODAY'S TREATMENT:  ?02/16/2022  Eval ?Therapeutic Exercise: ? Aerobic: ?Supine: ?Prone: ? Seated:  cervical rotation x 10;  ? Standing: Bwd shoulder rolls x 15;  scap squeezes x 15;  ?Neuromuscular Re-education: ?Manual Therapy: Light cervical distraction 10 sec x 10; Manual PROM for rotation;  ?Self Care: Discussed optimal pillow height  ? ? ?PATIENT EDUCATION:  ?Education details: on current presentation, on HEP, on clinical outcomes score and POC ?Person educated:  Patient ?Education method: Explanation, Demonstration, and Handouts ?Education comprehension: verbalized understanding ? ? ?HOME EXERCISE PROGRAM: ?Access Code: [email or web address] ?URL: [email or web address] ?Date: 02/16/2022 ?Prepared by: Lyndee Hensen ? ?Exercises ?- Standing Backward Shoulder Rolls  - 2 x daily - 1 sets - 10 reps ?- Standing Scapular Retraction  - 2 x daily - 1 sets - 10 reps ?- Seated Cervical Rotation AROM  - 1-2 x daily - 1 sets - 10 reps ? ?ASSESSMENT: ? ?CLINICAL IMPRESSION: ?Patient is a 71 y.o. male who was seen today for physical therapy evaluation and treatment for neck pain. He has hypomobility in c-spine and decreased ROM. He has limited and painful rotation to the R. He has decreased postural awareness and will benefit from education on this for optimal posture and decreased pain. Pt with decreased ability for full functional activities, IADLs, ADLs, and driving, due to pain. Pt to benefit from skilled PT to improve deficits and pain.   ? ? ?OBJECTIVE IMPAIRMENTS decreased activity tolerance, decreased ROM, decreased strength, hypomobility, impaired flexibility, improper body mechanics, and pain.  ? ?ACTIVITY LIMITATIONS cleaning, community activity, driving, and yard work.  ? ?PERSONAL FACTORS  xray findings  are also affecting patient's functional outcome.  ? ? ?REHAB POTENTIAL: Good ? ?CLINICAL DECISION MAKING: Stable/uncomplicated ? ?EVALUATION COMPLEXITY: Low ? ? ?GOALS: ?Goals reviewed with patient?  yes ? ?SHORT TERM GOALS: ? ?Patient will be independent in initial HEP ?Target date: 03/02/2022 ?Goal status: INITIAL ? ?2.  Patient will report at least 50% improvement in overall symptoms and/or function to demonstrate improved functional mobility ?Target date: 03/02/2022 ?Goal status: INITIAL ? ? ?LONG TERM GOALS: ? ?Patient will be independent with final HEP ?Target date: 03/30/2022 ?Goal status: INITIAL ? ?2.  Pt to report decreased pain in neck to 0-2/10 with activity and  rotation  ?Target date: 03/30/2022 ?Goal status: INITIAL ? ?3.  Pt to demo improved ROM for R rotation to be equal to L, up to 60  deg. ?Target date: 03/30/2022 ?Goal status: INITIAL ? ? ? ? ?PLAN: ?PT FREQUENCY: 1-2x/week ? ?PT DURATION: 6 weeks ? ?PLANNED INTERVENTIONS: Therapeutic exercises, Therapeutic activity, Neuromuscular re-education, Balance training, Gait training, Patient/Family education, Joint mobilization, Aquatic Therapy, Dry Needling, Electrical stimulation, Spinal mobilization, Cryotherapy, Moist heat, Traction, Ionotophoresis 26m/ml Dexamethasone, and Manual therapy ? ?PLAN FOR NEXT SESSION: Manual for neck, distraction, postural education, mobility, and strengthening.  ? ?LLyndee Hensen PT, DPT ?12:54 PM  02/16/22 ? ? ? ? ? ?  ?

## 2022-02-16 ENCOUNTER — Encounter: Payer: Self-pay | Admitting: Physical Therapy

## 2022-02-16 ENCOUNTER — Ambulatory Visit: Payer: PPO | Admitting: Physical Therapy

## 2022-02-16 DIAGNOSIS — M542 Cervicalgia: Secondary | ICD-10-CM | POA: Diagnosis not present

## 2022-02-21 ENCOUNTER — Encounter: Payer: Self-pay | Admitting: Physical Therapy

## 2022-02-21 ENCOUNTER — Ambulatory Visit: Payer: PPO | Admitting: Physical Therapy

## 2022-02-21 DIAGNOSIS — M542 Cervicalgia: Secondary | ICD-10-CM

## 2022-02-21 NOTE — Therapy (Signed)
?OUTPATIENT PHYSICAL THERAPY Treatment ? ? ?Patient Name: Edward Goodwin ?MRN: [id number] ?DOB:07-17-1951, 71 y.o., male ?Today's Date: 02/21/2022 ? ? ? PT End of Session - 02/21/22 1007   ? ? Visit Number 2   ? Number of Visits 12   ? Date for PT Re-Evaluation 03/30/22   ? Authorization Type HTA   ? PT Start Time 1008   ? PT Stop Time 1051   ? PT Time Calculation (min) 43 min   ? Activity Tolerance Patient tolerated treatment well   ? Behavior During Therapy [facility] for tasks assessed/performed   ? ?  ?  ? ?  ? ? ? ?Past Medical History:  ?Diagnosis Date  ? Chicken pox   ? Colitis, ulcerative ([place])   ? Hypertension   ? Macular degeneration, bilateral 07/2012  ? areds 2   ? Psoriasis   ? ?Past Surgical History:  ?Procedure Laterality Date  ? ACNE CYST REMOVAL    ? CATARACT EXTRACTION W/ INTRAOCULAR LENS IMPLANT  10/2014  ? bilat  ? COLONOSCOPY    ? FOOT SURGERY  1988  ? SHOULDER ARTHROSCOPY  2007  ? Right  ? TONSILLECTOMY    ? ?Patient Active Problem List  ? Diagnosis Date Noted  ? nonobstructive CAD (coronary artery disease)- CAC 247 on 05/03/2018  12/16/2021  ? Abdominal aortic aneurysm (AAA) without rupture, unspecified part ([place])   ? Former smoker 04/19/2018  ? Hyperlipidemia 04/19/2018  ? Vertigo 12/19/2017  ? Onychomycosis 12/19/2017  ? Trochanteric bursitis of left hip 03/31/2015  ? Gout 03/24/2014  ? Elevated PSA, less than 10 ng/ml 09/09/2013  ? Macular degeneration, bilateral 07/15/2012  ? Obesity (BMI 30.0-34.9) 12/02/2008  ? Essential hypertension 08/20/2007  ? Ulcerative colitis ([place]) 08/20/2007  ? PSORIASIS 08/20/2007  ? ? ?PCP: [name], MD ? ?REFERRING PROVIDER: [name], MD ? ?REFERRING DIAG: M54.2 (ICD-10-CM) - Neck pain on right side ? ?THERAPY DIAG:  ?Cervicalgia ? ?Neck pain on right side ? ?ONSET DATE: 2 months ago ? ?SUBJECTIVE:                                                                                                                                                                                                         ? ?SUBJECTIVE STATEMENT: ?Pt states continued soreness in neck, with rotation.  ? ?PERTINENT HISTORY:  ?None ? ?PAIN:  ?Are you having pain? Yes: NPRS scale: 2/10 up to 10/10 in AM:  10/10 ?Pain location: R neck/UT ?Pain description: aching, sore ?Aggravating factors: Am, rotation ?Relieving factors: none stated  ? ?  PRECAUTIONS: None ? ?WEIGHT BEARING RESTRICTIONS No ? ?FALLS:  ?Has patient fallen in last 6 months? No ? ?OCCUPATION: retired, likes to golf 2x/wk  ? ?PLOF: Independent ? ?PATIENT GOALS  decreased pain in neck with rotation ? ?OBJECTIVE:  ? ?DIAGNOSTIC FINDINGS:  ?Cervical xray: 01/2922 ?MPRESSION: ?No recent fracture is seen. Cervical spondylosis with large anterior ?bony spurs from C4-T1 levels. There is encroachment of neural ?foramina by bony spurs and facet hypertrophy at C4-C5 and C5-C6 ?levels. ? ?COGNITION: ?Overall cognitive status: Within functional limits for tasks assessed ? ? ?POSTURE:  ?Forward head  ? ?PALPATION: ?Hypomobile c-spine , minimal muscular tightness or pain ? ?CERVICAL ROM:  ? ?Active ROM A/PROM (deg) ?02/16/2022  ?Flexion Mild limitation  ?Extension Mod limitation  ?Right lateral flexion Sig limitation  ?Left lateral flexion Sig limitation  ?Right rotation 45  ?Left rotation 60  ? (Blank rows = not tested) ? ? ? UE Measurements ?UE: ROM: WFL, mild limitation for shoulder flexion ?Strength: 4+/5 ? ? ?TODAY'S TREATMENT:  ? ?02/22/23: ?Therapeutic Exercise: ?Aerobic: ?Supine: ?Prone: ?Seated:  cervical rotation x 10;  ?Standing: Bwd shoulder rolls x 20;  scap squeezes (arms at sides) x 20; chin tuck (small ROM) for posture awareness x 10; Shoulder ER ROM bil x 15; seated thoracic rotation x 10 bil;  ?Neuromuscular Re-education: ?Manual Therapy: Light cervical distraction 10 sec x 10; Manual PROM for rotation; STM to R cervical paraspinals, R UT, SOR;  ?Cervical PA mobs, high thoracic PA mobs  ?Self Care:  ? ? ?PATIENT EDUCATION:  ?Education  details: Reviewed and updated HEP.  ?Person educated: Patient ?Education method: Explanation, Demonstration, and Handouts ?Education comprehension: verbalized understanding ? ? ?HOME EXERCISE PROGRAM: ?Access Code: [email or web address] ? ? ?ASSESSMENT: ? ?CLINICAL IMPRESSION: ?Pt with tenderness in R cervical paraspinals and into sub occipitals, addressed with STM today. He continues to have pain on R, with R>L rotation. He has some improvement (5 deg) after manual today. He has much stiffness in c-spine and t-spine with mobilization today.  ? ? ?OBJECTIVE IMPAIRMENTS decreased activity tolerance, decreased ROM, decreased strength, hypomobility, impaired flexibility, improper body mechanics, and pain.  ? ?ACTIVITY LIMITATIONS cleaning, community activity, driving, and yard work.  ? ?PERSONAL FACTORS  xray findings  are also affecting patient's functional outcome.  ? ? ?REHAB POTENTIAL: Good ? ?CLINICAL DECISION MAKING: Stable/uncomplicated ? ?EVALUATION COMPLEXITY: Low ? ? ?GOALS: ?Goals reviewed with patient?  yes ? ?SHORT TERM GOALS: ? ?Patient will be independent in initial HEP ?Target date: 03/07/2022 ?Goal status: INITIAL ? ?2.  Patient will report at least 50% improvement in overall symptoms and/or function to demonstrate improved functional mobility ?Target date: 03/07/2022 ?Goal status: INITIAL ? ? ?LONG TERM GOALS: ? ?Patient will be independent with final HEP ?Target date: 04/04/2022 ?Goal status: INITIAL ? ?2.  Pt to report decreased pain in neck to 0-2/10 with activity and rotation  ?Target date: 04/04/2022 ?Goal status: INITIAL ? ?3.  Pt to demo improved ROM for R rotation to be equal to L, up to 60 deg. ?Target date: 04/04/2022 ?Goal status: INITIAL ? ? ? ? ?PLAN: ?PT FREQUENCY: 1-2x/week ? ?PT DURATION: 6 weeks ? ?PLANNED INTERVENTIONS: Therapeutic exercises, Therapeutic activity, Neuromuscular re-education, Balance training, Gait training, Patient/Family education, Joint mobilization, Aquatic Therapy, Dry Needling,  Electrical stimulation, Spinal mobilization, Cryotherapy, Moist heat, Traction, Ionotophoresis 54m/ml Dexamethasone, and Manual therapy ? ?PLAN FOR NEXT SESSION: Manual for neck, distraction, postural education, mobility, and strengthening.  ? ?LLyndee Hensen PT, DPT ?11:54 AM  02/21/22 ? ? ? ? ? ?  ?

## 2022-02-25 ENCOUNTER — Ambulatory Visit: Payer: PPO | Admitting: Physical Therapy

## 2022-02-25 ENCOUNTER — Encounter: Payer: Self-pay | Admitting: Physical Therapy

## 2022-02-25 DIAGNOSIS — M542 Cervicalgia: Secondary | ICD-10-CM | POA: Diagnosis not present

## 2022-02-25 NOTE — Therapy (Signed)
?OUTPATIENT PHYSICAL THERAPY Treatment ? ? ?Patient Name: Edward Goodwin ?MRN: [id number] ?DOB:01/06/1951, 71 y.o., male ?Today's Date: 02/25/2022 ? ? ? PT End of Session - 02/25/22 1330   ? ? Visit Number 3   ? Number of Visits 12   ? Date for PT Re-Evaluation 03/30/22   ? Authorization Type HTA   ? PT Start Time 1017   ? PT Stop Time 1055   ? PT Time Calculation (min) 38 min   ? Activity Tolerance Patient tolerated treatment well   ? Behavior During Therapy [facility] for tasks assessed/performed   ? ?  ?  ? ?  ? ? ? ? ?Past Medical History:  ?Diagnosis Date  ? Chicken pox   ? Colitis, ulcerative ([place])   ? Hypertension   ? Macular degeneration, bilateral 07/2012  ? areds 2   ? Psoriasis   ? ?Past Surgical History:  ?Procedure Laterality Date  ? ACNE CYST REMOVAL    ? CATARACT EXTRACTION W/ INTRAOCULAR LENS IMPLANT  10/2014  ? bilat  ? COLONOSCOPY    ? FOOT SURGERY  1988  ? SHOULDER ARTHROSCOPY  2007  ? Right  ? TONSILLECTOMY    ? ?Patient Active Problem List  ? Diagnosis Date Noted  ? nonobstructive CAD (coronary artery disease)- CAC 247 on 05/03/2018  12/16/2021  ? Abdominal aortic aneurysm (AAA) without rupture, unspecified part ([place])   ? Former smoker 04/19/2018  ? Hyperlipidemia 04/19/2018  ? Vertigo 12/19/2017  ? Onychomycosis 12/19/2017  ? Trochanteric bursitis of left hip 03/31/2015  ? Gout 03/24/2014  ? Elevated PSA, less than 10 ng/ml 09/09/2013  ? Macular degeneration, bilateral 07/15/2012  ? Obesity (BMI 30.0-34.9) 12/02/2008  ? Essential hypertension 08/20/2007  ? Ulcerative colitis ([place]) 08/20/2007  ? PSORIASIS 08/20/2007  ? ? ?PCP: [name], MD ? ?REFERRING PROVIDER: [name], MD ? ?REFERRING DIAG: M54.2 (ICD-10-CM) - Neck pain on right side ? ?THERAPY DIAG:  ?Cervicalgia ? ?ONSET DATE: 2 months ago ? ?SUBJECTIVE:                                                                                                                                                                                                         ? ?SUBJECTIVE STATEMENT: ?Pt states continued soreness in neck, with rotation, thinks overall pain is improving some, had less stiffness and soreness this am.   ? ?PERTINENT HISTORY:  ?None ? ?PAIN:  ?Are you having pain? Yes: NPRS scale: 2/10 up to 10/10 in AM:  10/10 ?Pain location: R neck/UT ?Pain description: aching,  sore ?Aggravating factors: Am, rotation ?Relieving factors: none stated  ? ?PRECAUTIONS: None ? ?WEIGHT BEARING RESTRICTIONS No ? ?FALLS:  ?Has patient fallen in last 6 months? No ? ?OCCUPATION: retired, likes to golf 2x/wk  ? ?PLOF: Independent ? ?PATIENT GOALS  decreased pain in neck with rotation ? ?OBJECTIVE:  ? ?DIAGNOSTIC FINDINGS:  ?Cervical xray: 01/2922 ?MPRESSION: ?No recent fracture is seen. Cervical spondylosis with large anterior ?bony spurs from C4-T1 levels. There is encroachment of neural ?foramina by bony spurs and facet hypertrophy at C4-C5 and C5-C6 ?levels. ? ?COGNITION: ?Overall cognitive status: Within functional limits for tasks assessed ? ? ?POSTURE:  ?Forward head  ? ?PALPATION: ?Hypomobile c-spine , minimal muscular tightness or pain ? ?CERVICAL ROM:  ? ?Active ROM A/PROM (deg) ?02/16/2022  ?Flexion Mild limitation  ?Extension Mod limitation  ?Right lateral flexion Sig limitation  ?Left lateral flexion Sig limitation  ?Right rotation 45  ?Left rotation 60  ? (Blank rows = not tested) ? ? ? UE Measurements ?UE: ROM: WFL, mild limitation for shoulder flexion ?Strength: 4+/5 ? ? ?TODAY'S TREATMENT:  ? ?02/25/2022 ?Therapeutic Exercise: ?Aerobic: ?Supine:  SA punches x 15;  ?Prone: ?Seated:  Thoracic rotation x 15;  ?Standing: Rows Blue TB x 20;  Shoulder ER bil RTB x 20; Low row- no weight x 10;  ?Stretches: supine pec stretch 30 sec x 2 bil;  ?Neuromuscular Re-education: ?Manual Therapy: cervical distraction 10 sec x 10; Manual PROM for rotation; STM to R cervical paraspinals, R UT, SOR; Cervical PA mobs, high thoracic PA mobs  ?Self Care:   ? ? ? ?02/22/23: ?Therapeutic Exercise: ?Aerobic: ?Supine: ?Prone: ?Seated:  cervical rotation x 10;  ?Standing: Bwd shoulder rolls x 20;  scap squeezes (arms at sides) x 20; chin tuck (small ROM) for posture awareness x 10; Shoulder ER ROM bil x 15; seated thoracic rotation x 10 bil;  ?Neuromuscular Re-education: ?Manual Therapy: Light cervical distraction 10 sec x 10; Manual PROM for rotation; STM to R cervical paraspinals, R UT, SOR;  ?Cervical PA mobs, high thoracic PA mobs  ?Self Care:  ? ? ?PATIENT EDUCATION:  ?Education details: Reviewed and updated HEP.  ?Person educated: Patient ?Education method: Explanation, Demonstration, and Handouts ?Education comprehension: verbalized understanding ? ? ?HOME EXERCISE PROGRAM: ?Access Code: [email or web address] ? ? ?ASSESSMENT: ? ?CLINICAL IMPRESSION: ?Pt with mild improvement in pain with rotation after session today, still feels sore, more with R rotation. Pt able to progress postural mobility and strengthening today without increased pain. Plan to progress as tolerated, will continue manual for decompression and to improve ROM.  ? ? ?OBJECTIVE IMPAIRMENTS decreased activity tolerance, decreased ROM, decreased strength, hypomobility, impaired flexibility, improper body mechanics, and pain.  ? ?ACTIVITY LIMITATIONS cleaning, community activity, driving, and yard work.  ? ?PERSONAL FACTORS  xray findings  are also affecting patient's functional outcome.  ? ? ?REHAB POTENTIAL: Good ? ?CLINICAL DECISION MAKING: Stable/uncomplicated ? ?EVALUATION COMPLEXITY: Low ? ? ?GOALS: ?Goals reviewed with patient?  yes ? ?SHORT TERM GOALS: ? ?Patient will be independent in initial HEP ?Target date: 03/11/2022 ?Goal status: INITIAL ? ?2.  Patient will report at least 50% improvement in overall symptoms and/or function to demonstrate improved functional mobility ?Target date: 03/11/2022 ?Goal status: INITIAL ? ? ?LONG TERM GOALS: ? ?Patient will be independent with final HEP ?Target date:  04/08/2022 ?Goal status: INITIAL ? ?2.  Pt to report decreased pain in neck to 0-2/10 with activity and rotation  ?Target date: 04/08/2022 ?Goal status: INITIAL ? ?3.  Pt  to demo improved ROM for R rotation to be equal to L, up to 60 deg. ?Target date: 04/08/2022 ?Goal status: INITIAL ? ? ? ? ?PLAN: ?PT FREQUENCY: 1-2x/week ? ?PT DURATION: 6 weeks ? ?PLANNED INTERVENTIONS: Therapeutic exercises, Therapeutic activity, Neuromuscular re-education, Balance training, Gait training, Patient/Family education, Joint mobilization, Aquatic Therapy, Dry Needling, Electrical stimulation, Spinal mobilization, Cryotherapy, Moist heat, Traction, Ionotophoresis 35m/ml Dexamethasone, and Manual therapy ? ?PLAN FOR NEXT SESSION: Manual for neck, distraction, postural education, mobility, and strengthening.  ? ?LLyndee Hensen PT, DPT ?1:31 PM  02/25/22 ? ? ? ? ? ?  ?

## 2022-02-28 ENCOUNTER — Ambulatory Visit: Payer: PPO | Admitting: Physical Therapy

## 2022-02-28 DIAGNOSIS — M542 Cervicalgia: Secondary | ICD-10-CM | POA: Diagnosis not present

## 2022-02-28 NOTE — Therapy (Signed)
?OUTPATIENT PHYSICAL THERAPY Treatment ? ? ?Patient Name: Edward Goodwin ?MRN: [id number] ?DOB:06-Nov-1951, 71 y.o., male ?Today's Date: 02/28/2022 ? ? ? PT End of Session - 03/01/22 1139   ? ? Visit Number 4   ? Number of Visits 12   ? Date for PT Re-Evaluation 03/30/22   ? Authorization Type HTA   ? PT Start Time 1015   ? PT Stop Time 5102   ? PT Time Calculation (min) 38 min   ? Activity Tolerance Patient tolerated treatment well   ? Behavior During Therapy [facility] for tasks assessed/performed   ? ?  ?  ? ?  ? ? ? ? ? ?Past Medical History:  ?Diagnosis Date  ? Chicken pox   ? Colitis, ulcerative ([place])   ? Hypertension   ? Macular degeneration, bilateral 07/2012  ? areds 2   ? Psoriasis   ? ?Past Surgical History:  ?Procedure Laterality Date  ? ACNE CYST REMOVAL    ? CATARACT EXTRACTION W/ INTRAOCULAR LENS IMPLANT  10/2014  ? bilat  ? COLONOSCOPY    ? FOOT SURGERY  1988  ? SHOULDER ARTHROSCOPY  2007  ? Right  ? TONSILLECTOMY    ? ?Patient Active Problem List  ? Diagnosis Date Noted  ? nonobstructive CAD (coronary artery disease)- CAC 247 on 05/03/2018  12/16/2021  ? Abdominal aortic aneurysm (AAA) without rupture, unspecified part ([place])   ? Former smoker 04/19/2018  ? Hyperlipidemia 04/19/2018  ? Vertigo 12/19/2017  ? Onychomycosis 12/19/2017  ? Trochanteric bursitis of left hip 03/31/2015  ? Gout 03/24/2014  ? Elevated PSA, less than 10 ng/ml 09/09/2013  ? Macular degeneration, bilateral 07/15/2012  ? Obesity (BMI 30.0-34.9) 12/02/2008  ? Essential hypertension 08/20/2007  ? Ulcerative colitis ([place]) 08/20/2007  ? PSORIASIS 08/20/2007  ? ? ?PCP: [name], MD ? ?REFERRING PROVIDER: [name], MD ? ?REFERRING DIAG: M54.2 (ICD-10-CM) - Neck pain on right side ? ?THERAPY DIAG:  ?Cervicalgia ? ?ONSET DATE: 2 months ago ? ?SUBJECTIVE:                                                                                                                                                                                                         ? ?SUBJECTIVE STATEMENT: ?Pt states variable pain, depending on activity. Still sore with R rotation.    ? ?PERTINENT HISTORY:  ?None ? ?PAIN:  ?Are you having pain? Yes: NPRS scale: 2/10 up to 10/10 in AM:  10/10 ?Pain location: R neck/UT ?Pain description: aching, sore ?Aggravating factors: Am, rotation ?Relieving factors:  none stated  ? ?PRECAUTIONS: None ? ?WEIGHT BEARING RESTRICTIONS No ? ?FALLS:  ?Has patient fallen in last 6 months? No ? ?OCCUPATION: retired, likes to golf 2x/wk  ? ?PLOF: Independent ? ?PATIENT GOALS  decreased pain in neck with rotation ? ?OBJECTIVE:  ? ?DIAGNOSTIC FINDINGS:  ?Cervical xray: 01/2922 ?MPRESSION: ?No recent fracture is seen. Cervical spondylosis with large anterior ?bony spurs from C4-T1 levels. There is encroachment of neural ?foramina by bony spurs and facet hypertrophy at C4-C5 and C5-C6 ?levels. ? ?COGNITION: ?Overall cognitive status: Within functional limits for tasks assessed ? ? ?POSTURE:  ?Forward head  ? ?PALPATION: ?Hypomobile c-spine , minimal muscular tightness or pain ? ?CERVICAL ROM:  ? ?Active ROM A/PROM (deg) ?02/16/2022  ?Flexion Mild limitation  ?Extension Mod limitation  ?Right lateral flexion Sig limitation  ?Left lateral flexion Sig limitation  ?Right rotation 45  ?Left rotation 60  ? (Blank rows = not tested) ? ? ? UE Measurements ?UE: ROM: WFL, mild limitation for shoulder flexion ?Strength: 4+/5 ? ? ?TODAY'S TREATMENT:  ? ?02/28/2022 ?Therapeutic Exercise: ?Aerobic: ?Supine:  SA punches x 15;  ?Seated:  Thoracic rotation x 15; Thoracic extension over towel roll x 15;  ?Standing: Rows Blue TB x 20;  Shoulder ER bil RTB x 20; Low row- no weight x 10;  ?Stretches: supine pec stretch 30 sec x 2 bil;  ?Neuromuscular Re-education: ?Manual Therapy: cervical distraction 10 sec x 10; Manual PROM for rotation; STM to R cervical paraspinals, R UT, SOR; Cervical PA mobs, high thoracic PA mobs  ?Self Care:  ? ? ? ?02/22/23: ?Therapeutic  Exercise: ?Aerobic: ?Supine: ?Prone: ?Seated:  cervical rotation x 10;  ?Standing: Bwd shoulder rolls x 20;  scap squeezes (arms at sides) x 20; chin tuck (small ROM) for posture awareness x 10; Shoulder ER ROM bil x 15; seated thoracic rotation x 10 bil;  ?Neuromuscular Re-education: ?Manual Therapy: Light cervical distraction 10 sec x 10; Manual PROM for rotation; STM to R cervical paraspinals, R UT, SOR;  ?Cervical PA mobs, high thoracic PA mobs  ?Self Care:  ? ? ?PATIENT EDUCATION:  ?Education details: Reviewed and updated HEP.  ?Person educated: Patient ?Education method: Explanation, Demonstration, and Handouts ?Education comprehension: verbalized understanding ? ? ?HOME EXERCISE PROGRAM: ?Access Code: [email or web address] ? ? ?ASSESSMENT: ? ?CLINICAL IMPRESSION: ?Pt with minimal change in pain with rotation. He has low pain levels during the day, but continues to have increased pain with rotation. Plan to continue manual and ther ex as tolerated.   ? ? ?OBJECTIVE IMPAIRMENTS decreased activity tolerance, decreased ROM, decreased strength, hypomobility, impaired flexibility, improper body mechanics, and pain.  ? ?ACTIVITY LIMITATIONS cleaning, community activity, driving, and yard work.  ? ?PERSONAL FACTORS  xray findings  are also affecting patient's functional outcome.  ? ? ?REHAB POTENTIAL: Good ? ?CLINICAL DECISION MAKING: Stable/uncomplicated ? ?EVALUATION COMPLEXITY: Low ? ? ?GOALS: ?Goals reviewed with patient?  yes ? ?SHORT TERM GOALS: ? ?Patient will be independent in initial HEP ?Target date: 03/15/2022 ?Goal status: INITIAL ? ?2.  Patient will report at least 50% improvement in overall symptoms and/or function to demonstrate improved functional mobility ?Target date: 03/15/2022 ?Goal status: INITIAL ? ? ?LONG TERM GOALS: ? ?Patient will be independent with final HEP ?Target date: 04/12/2022 ?Goal status: INITIAL ? ?2.  Pt to report decreased pain in neck to 0-2/10 with activity and rotation  ?Target date:  04/12/2022 ?Goal status: INITIAL ? ?3.  Pt to demo improved ROM for R rotation to be equal to  L, up to 60 deg. ?Target date: 04/12/2022 ?Goal status: INITIAL ? ? ? ? ?PLAN: ?PT FREQUENCY: 1-2x/week ? ?PT DURATION: 6 weeks ? ?PLANNED INTERVENTIONS: Therapeutic exercises, Therapeutic activity, Neuromuscular re-education, Balance training, Gait training, Patient/Family education, Joint mobilization, Aquatic Therapy, Dry Needling, Electrical stimulation, Spinal mobilization, Cryotherapy, Moist heat, Traction, Ionotophoresis 52m/ml Dexamethasone, and Manual therapy ? ?PLAN FOR NEXT SESSION: Manual for neck, distraction, postural education, mobility, and strengthening.  ? ?LLyndee Hensen PT, DPT ?11:41 AM  03/01/22 ? ? ? ? ? ?  ?

## 2022-03-01 ENCOUNTER — Encounter: Payer: Self-pay | Admitting: Physical Therapy

## 2022-03-04 ENCOUNTER — Encounter: Payer: Self-pay | Admitting: Physical Therapy

## 2022-03-04 ENCOUNTER — Ambulatory Visit: Payer: PPO | Admitting: Physical Therapy

## 2022-03-04 DIAGNOSIS — M542 Cervicalgia: Secondary | ICD-10-CM | POA: Diagnosis not present

## 2022-03-04 NOTE — Therapy (Signed)
?OUTPATIENT PHYSICAL THERAPY Treatment ? ? ?Patient Name: Edward Goodwin ?MRN: [id number] ?DOB:Sep 28, 1951, 71 y.o., male ?Today's Date: 03/04/2022 ? ? ? ? PT End of Session - 03/04/22 1014   ? ? Visit Number 5   ? Number of Visits 12   ? Date for PT Re-Evaluation 03/30/22   ? Authorization Type HTA   ? PT Start Time 1015   ? PT Stop Time 3893   ? PT Time Calculation (min) 38 min   ? Activity Tolerance Patient tolerated treatment well   ? Behavior During Therapy [facility] for tasks assessed/performed   ? ?  ?  ? ?  ? ? ? ? ? ?Past Medical History:  ?Diagnosis Date  ? Chicken pox   ? Colitis, ulcerative ([place])   ? Hypertension   ? Macular degeneration, bilateral 07/2012  ? areds 2   ? Psoriasis   ? ?Past Surgical History:  ?Procedure Laterality Date  ? ACNE CYST REMOVAL    ? CATARACT EXTRACTION W/ INTRAOCULAR LENS IMPLANT  10/2014  ? bilat  ? COLONOSCOPY    ? FOOT SURGERY  1988  ? SHOULDER ARTHROSCOPY  2007  ? Right  ? TONSILLECTOMY    ? ?Patient Active Problem List  ? Diagnosis Date Noted  ? nonobstructive CAD (coronary artery disease)- CAC 247 on 05/03/2018  12/16/2021  ? Abdominal aortic aneurysm (AAA) without rupture, unspecified part ([place])   ? Former smoker 04/19/2018  ? Hyperlipidemia 04/19/2018  ? Vertigo 12/19/2017  ? Onychomycosis 12/19/2017  ? Trochanteric bursitis of left hip 03/31/2015  ? Gout 03/24/2014  ? Elevated PSA, less than 10 ng/ml 09/09/2013  ? Macular degeneration, bilateral 07/15/2012  ? Obesity (BMI 30.0-34.9) 12/02/2008  ? Essential hypertension 08/20/2007  ? Ulcerative colitis ([place]) 08/20/2007  ? PSORIASIS 08/20/2007  ? ? ?PCP: [name], MD ? ?REFERRING PROVIDER: [name], MD ? ?REFERRING DIAG: M54.2 (ICD-10-CM) - Neck pain on right side ? ?THERAPY DIAG:  ?Cervicalgia ? ?ONSET DATE: 2 months ago ? ?SUBJECTIVE:                                                                                                                                                                                                         ? ?SUBJECTIVE STATEMENT: ?Pt states slight increase in soreness this week, with mowing lawn and vacuuming.  ? ?PERTINENT HISTORY:  ?None ? ?PAIN:  ?Are you having pain? Yes: NPRS scale: 2/10 up to 10/10 in AM:  10/10 ?Pain location: R neck/UT ?Pain description: aching, sore ?Aggravating factors: Am, rotation ?Relieving factors:  none stated  ? ?PRECAUTIONS: None ? ?WEIGHT BEARING RESTRICTIONS No ? ?FALLS:  ?Has patient fallen in last 6 months? No ? ?OCCUPATION: retired, likes to golf 2x/wk  ? ?PLOF: Independent ? ?PATIENT GOALS  decreased pain in neck with rotation ? ?OBJECTIVE:  ? ?DIAGNOSTIC FINDINGS:  ?Cervical xray: 01/2922 ?MPRESSION: ?No recent fracture is seen. Cervical spondylosis with large anterior ?bony spurs from C4-T1 levels. There is encroachment of neural ?foramina by bony spurs and facet hypertrophy at C4-C5 and C5-C6 ?levels. ? ?COGNITION: ?Overall cognitive status: Within functional limits for tasks assessed ? ? ?POSTURE:  ?Forward head  ? ?PALPATION: ?Hypomobile c-spine , minimal muscular tightness or pain ? ?CERVICAL ROM:  ? ?Active ROM A/PROM (deg) ?02/16/2022  ?Flexion Mild limitation  ?Extension Mod limitation  ?Right lateral flexion Sig limitation  ?Left lateral flexion Sig limitation  ?Right rotation 45  ?Left rotation 60  ? (Blank rows = not tested) ? ? ? UE Measurements ?UE: ROM: WFL, mild limitation for shoulder flexion ?Strength: 4+/5 ? ? ?TODAY'S TREATMENT:  ? ?03/04/22: ?Therapeutic Exercise: ?Aerobic: ?Supine:   ?Seated:  Thoracic rotation x 15;  ?Standing: Rows Blue TB x 20; Shoulder ER bil GTB x 20; Low row- no weight x 10;  ?Stretches: supine pec stretch 30 sec x 2 bil; Supine Shoulder ER butterfly x 10;  ?Neuromuscular Re-education: ?Manual Therapy: cervical distraction ; Manual PROM for rotation; STM to bil cervical paraspinals, R UT; Cervical PA mobs, high thoracic PA mobs  ?Self Care:  ? ? ? ?02/28/2022 ?Therapeutic Exercise: ?Aerobic: ?Supine:  SA punches x 15;   ?Seated:  Thoracic rotation x 15; Thoracic extension over towel roll x 15;  ?Standing: Rows Blue TB x 20;  Shoulder ER bil RTB x 20; Low row- no weight x 10;  ?Stretches: supine pec stretch 30 sec x 2 bil;  ?Neuromuscular Re-education: ?Manual Therapy: cervical distraction 10 sec x 10; Manual PROM for rotation; STM to R cervical paraspinals, R UT, SOR; Cervical PA mobs, high thoracic PA mobs  ?Self Care:  ? ? ? ?02/22/23: ?Therapeutic Exercise: ?Aerobic: ?Supine: ?Prone: ?Seated:  cervical rotation x 10;  ?Standing: Bwd shoulder rolls x 20;  scap squeezes (arms at sides) x 20; chin tuck (small ROM) for posture awareness x 10; Shoulder ER ROM bil x 15; seated thoracic rotation x 10 bil;  ?Neuromuscular Re-education: ?Manual Therapy: Light cervical distraction 10 sec x 10; Manual PROM for rotation; STM to R cervical paraspinals, R UT, SOR;  ?Cervical PA mobs, high thoracic PA mobs  ?Self Care:  ? ? ?PATIENT EDUCATION:  ?Education details: Reviewed and updated HEP.  ?Person educated: Patient ?Education method: Explanation, Demonstration, and Handouts ?Education comprehension: verbalized understanding ? ? ?HOME EXERCISE PROGRAM: ?Access Code: [email or web address] ? ? ?ASSESSMENT: ? ?CLINICAL IMPRESSION: ?Pt with minimal change in pain with rotation. We will plan to continue through next week, and make referral to ortho or sports med as needed, if he does not have significant relief.  ? ?OBJECTIVE IMPAIRMENTS decreased activity tolerance, decreased ROM, decreased strength, hypomobility, impaired flexibility, improper body mechanics, and pain.  ? ?ACTIVITY LIMITATIONS cleaning, community activity, driving, and yard work.  ? ?PERSONAL FACTORS  xray findings  are also affecting patient's functional outcome.  ? ? ?REHAB POTENTIAL: Good ? ?CLINICAL DECISION MAKING: Stable/uncomplicated ? ?EVALUATION COMPLEXITY: Low ? ? ?GOALS: ?Goals reviewed with patient?  yes ? ?SHORT TERM GOALS: ? ?Patient will be independent in initial HEP ?Target  date: 03/18/2022 ?Goal status: INITIAL ? ?2.  Patient  will report at least 50% improvement in overall symptoms and/or function to demonstrate improved functional mobility ?Target date: 03/18/2022 ?Goal status: INITIAL ? ? ?LONG TERM GOALS: ? ?Patient will be independent with final HEP ?Target date: 04/15/2022 ?Goal status: INITIAL ? ?2.  Pt to report decreased pain in neck to 0-2/10 with activity and rotation  ?Target date: 04/15/2022 ?Goal status: INITIAL ? ?3.  Pt to demo improved ROM for R rotation to be equal to L, up to 60 deg. ?Target date: 04/15/2022 ?Goal status: INITIAL ? ? ? ? ?PLAN: ?PT FREQUENCY: 1-2x/week ? ?PT DURATION: 6 weeks ? ?PLANNED INTERVENTIONS: Therapeutic exercises, Therapeutic activity, Neuromuscular re-education, Balance training, Gait training, Patient/Family education, Joint mobilization, Aquatic Therapy, Dry Needling, Electrical stimulation, Spinal mobilization, Cryotherapy, Moist heat, Traction, Ionotophoresis 39m/ml Dexamethasone, and Manual therapy ? ?PLAN FOR NEXT SESSION: Manual for neck, distraction, postural education, mobility, and strengthening.  ? ?LLyndee Hensen PT, DPT ?10:59 AM  03/04/22 ? ? ? ? ? ?  ?

## 2022-03-09 ENCOUNTER — Ambulatory Visit: Payer: PPO | Admitting: Physical Therapy

## 2022-03-09 ENCOUNTER — Other Ambulatory Visit: Payer: Self-pay | Admitting: Family Medicine

## 2022-03-09 DIAGNOSIS — M542 Cervicalgia: Secondary | ICD-10-CM | POA: Diagnosis not present

## 2022-03-09 NOTE — Therapy (Signed)
?OUTPATIENT PHYSICAL THERAPY Treatment ? ? ?Patient Name: Pape Parson ?MRN: [id number] ?DOB:02/13/1951, 71 y.o., male ?Today's Date: 03/11/2022 ? ? ? ? PT End of Session - 03/11/22 0913   ? ? Visit Number 6   ? Number of Visits 12   ? Date for PT Re-Evaluation 03/30/22   ? Authorization Type HTA   ? PT Start Time 1017   ? PT Stop Time 1058   ? PT Time Calculation (min) 41 min   ? Activity Tolerance Patient tolerated treatment well   ? Behavior During Therapy [facility] for tasks assessed/performed   ? ?  ?  ? ?  ? ? ? ? ? ? ?Past Medical History:  ?Diagnosis Date  ? Chicken pox   ? Colitis, ulcerative ([place])   ? Hypertension   ? Macular degeneration, bilateral 07/2012  ? areds 2   ? Psoriasis   ? ?Past Surgical History:  ?Procedure Laterality Date  ? ACNE CYST REMOVAL    ? CATARACT EXTRACTION W/ INTRAOCULAR LENS IMPLANT  10/2014  ? bilat  ? COLONOSCOPY    ? FOOT SURGERY  1988  ? SHOULDER ARTHROSCOPY  2007  ? Right  ? TONSILLECTOMY    ? ?Patient Active Problem List  ? Diagnosis Date Noted  ? nonobstructive CAD (coronary artery disease)- CAC 247 on 05/03/2018  12/16/2021  ? Abdominal aortic aneurysm (AAA) without rupture, unspecified part ([place])   ? Former smoker 04/19/2018  ? Hyperlipidemia 04/19/2018  ? Vertigo 12/19/2017  ? Onychomycosis 12/19/2017  ? Trochanteric bursitis of left hip 03/31/2015  ? Gout 03/24/2014  ? Elevated PSA, less than 10 ng/ml 09/09/2013  ? Macular degeneration, bilateral 07/15/2012  ? Obesity (BMI 30.0-34.9) 12/02/2008  ? Essential hypertension 08/20/2007  ? Ulcerative colitis ([place]) 08/20/2007  ? PSORIASIS 08/20/2007  ? ? ?PCP: [name], MD ? ?REFERRING PROVIDER: [name], MD ? ?REFERRING DIAG: M54.2 (ICD-10-CM) - Neck pain on right side ? ?THERAPY DIAG:  ?Cervicalgia ? ?Neck pain on right side ? ?ONSET DATE: 2 months ago ? ?SUBJECTIVE:                                                                                                                                                                                                         ? ?SUBJECTIVE STATEMENT: ?Pt states continued pain in neck, notes minimal relief, still having pain with rotation.  ? ? ?PERTINENT HISTORY:  ?None ? ?PAIN:  ?Are you having pain? Yes: NPRS scale: 2/10 up to 10/10 in AM:  10/10 ?Pain location: R neck/UT ?Pain  description: aching, sore ?Aggravating factors: Am, rotation ?Relieving factors: none stated  ? ?PRECAUTIONS: None ? ?WEIGHT BEARING RESTRICTIONS No ? ?FALLS:  ?Has patient fallen in last 6 months? No ? ?OCCUPATION: retired, likes to golf 2x/wk  ? ?PLOF: Independent ? ?PATIENT GOALS  decreased pain in neck with rotation ? ?OBJECTIVE:  ? ?DIAGNOSTIC FINDINGS:  ?Cervical xray: 01/2922 ?MPRESSION: ?No recent fracture is seen. Cervical spondylosis with large anterior ?bony spurs from C4-T1 levels. There is encroachment of neural ?foramina by bony spurs and facet hypertrophy at C4-C5 and C5-C6 ?levels. ? ?COGNITION: ?Overall cognitive status: Within functional limits for tasks assessed ? ? ?POSTURE:  ?Forward head  ? ?PALPATION: ?Hypomobile c-spine , minimal muscular tightness or pain ? ?CERVICAL ROM:  ? ?Active ROM A/PROM (deg) ?02/16/2022 03/09/22  ?Flexion Mild limitation WFL- mild pain  ?Extension Mod limitation Mod limitation-pain  ?Right lateral flexion Sig limitation   ?Left lateral flexion Sig limitation   ?Right rotation 45 50- pain  ?Left rotation 60 60  ? (Blank rows = not tested) ? ? ? UE Measurements ?UE: ROM: WFL, mild limitation for shoulder flexion ?Strength: 4+/5 ? ? ?TODAY'S TREATMENT:  ?03/09/22: ?Therapeutic Exercise: ?Aerobic: ?Supine:   ?Seated:  Thoracic rotation x 15;  ?Standing: Rows Blue TB x 20; Shoulder ER bil GTB x 20; Low row- no weight x 10; Wall angel x 10;  ?Stretches: Doorway 30 sec x 3;  supine pec stretch 30 sec x 2 bil; Neuromuscular Re-education: ?Manual Therapy: cervical distraction ; Manual PROM for rotation; STM to bil cervical paraspinals, R UT; R SCM,  Cervical PA mobs, high  thoracic PA mobs  ? ? ? ?03/04/22: ?Therapeutic Exercise: ?Aerobic: ?Supine:   ?Seated:  Thoracic rotation x 15;  ?Standing: Rows Blue TB x 20; Shoulder ER bil GTB x 20; Low row- no weight x 10;  ?Stretches: supine pec stretch 30 sec x 2 bil; Supine Shoulder ER butterfly x 10;  ?Neuromuscular Re-education: ?Manual Therapy: cervical distraction ; Manual PROM for rotation; STM to bil cervical paraspinals, R UT; Cervical PA mobs, high thoracic PA mobs  ?Self Care:  ? ? ? ? ?PATIENT EDUCATION:  ?Education details: Reviewed and updated HEP.  ?Person educated: Patient ?Education method: Explanation, Demonstration, and Handouts ?Education comprehension: verbalized understanding ? ? ?HOME EXERCISE PROGRAM: ?Access Code: [email or web address] ? ? ?ASSESSMENT: ? ?CLINICAL IMPRESSION: ?Pt has been seen for 6 visits. He has had minimal relief of pain. He continues to have pain in R side of neck, increased with rotation and SB.  He has good tolerance to manual therapy and PT sessions, but minimal pain relief at this time. Pt will be referred to sports med for further evaluation and discussion of treatment options.  ? ?OBJECTIVE IMPAIRMENTS decreased activity tolerance, decreased ROM, decreased strength, hypomobility, impaired flexibility, improper body mechanics, and pain.  ? ?ACTIVITY LIMITATIONS cleaning, community activity, driving, and yard work.  ? ?PERSONAL FACTORS  xray findings  are also affecting patient's functional outcome.  ? ? ?REHAB POTENTIAL: Good ? ?CLINICAL DECISION MAKING: Stable/uncomplicated ? ?EVALUATION COMPLEXITY: Low ? ? ?GOALS: ?Goals reviewed with patient?  yes ? ?SHORT TERM GOALS: ? ?Patient will be independent in initial HEP ?Target date: 03/25/2022 ?Goal status: MET ? ?2.  Patient will report at least 50% improvement in overall symptoms and/or function to demonstrate improved functional mobility ?Target date: 03/25/2022 ?Goal status: MET ? ? ?LONG TERM GOALS: ? ?Patient will be independent with final HEP ?Target  date: 04/22/2022 ?Goal status: MET ? ?2.  Pt to report decreased pain in neck to 0-2/10 with activity and rotation  ?Target date: 04/22/2022 ?Goal status: Not met  ? ?3.  Pt to demo improved ROM for R rotation to be equal to L, up to 60 deg. ?Target date: 04/22/2022 ?Goal status: Partially met ? ? ? ? ?PLAN: ?PT FREQUENCY: 1-2x/week ? ?PT DURATION: 6 weeks ? ?PLANNED INTERVENTIONS: Therapeutic exercises, Therapeutic activity, Neuromuscular re-education, Balance training, Gait training, Patient/Family education, Joint mobilization, Aquatic Therapy, Dry Needling, Electrical stimulation, Spinal mobilization, Cryotherapy, Moist heat, Traction, Ionotophoresis 80m/ml Dexamethasone, and Manual therapy ? ?PLAN FOR NEXT SESSION:  ? ? ?LLyndee Hensen PT, DPT ?9:13 AM  03/11/22 ? ? ? ? ? ?  ?

## 2022-03-11 ENCOUNTER — Encounter: Payer: Self-pay | Admitting: Physical Therapy

## 2022-03-11 ENCOUNTER — Encounter: Payer: PPO | Admitting: Physical Therapy

## 2022-03-14 ENCOUNTER — Encounter: Payer: PPO | Admitting: Physical Therapy

## 2022-03-14 ENCOUNTER — Encounter: Payer: Self-pay | Admitting: Family Medicine

## 2022-03-14 ENCOUNTER — Ambulatory Visit: Payer: PPO | Admitting: Family Medicine

## 2022-03-14 VITALS — BP 140/82 | HR 53 | Ht 69.0 in | Wt 193.4 lb

## 2022-03-14 DIAGNOSIS — M5010 Cervical disc disorder with radiculopathy, unspecified cervical region: Secondary | ICD-10-CM

## 2022-03-14 MED ORDER — LORAZEPAM 0.5 MG PO TABS: ORAL_TABLET | ORAL | 0 refills | Status: DC

## 2022-03-14 NOTE — Patient Instructions (Addendum)
Nice to meet you. ? ?You should hear from MRI scheduling within 1 week. If you do not hear please let me know.   ? ?Follow-up: after MRI ?

## 2022-03-14 NOTE — Progress Notes (Signed)
? ?  I, Wendy Poet, LAT, ATC, am serving as scribe for Dr. Lynne Leader. ? ?Subjective:   ? ?CC: Neck pain ? ?HPI: Pt is a 72 y/o male c/o neck pain ongoing since mid-Jan 2023. Pt was seen for this issue by his PCP on 12/16/21 and again on 02/09/22 and was referred to PT, completing 6 visits, and who referred pt to [facility]. Pt locates pain to the R side of his neck.  ? ?Radiates: yes into his R upper trap ?UE Numbness/tingling: no ?Aggravates: cervical rotation, R>L; all cervical AROM, worse in morning ?Treatments tried: biofreeze, IBU; current PT; Aleve; Motrin; Tylenol; muscle relaxers; heat ? ?Dx imaging: 02/09/22 C-spine XR ? ?Pertinent review of Systems: No fevers or chills ? ?Relevant historical information: Ulcerative colitis ? ? ?Objective:   ? ?Vitals:  ? 03/14/22 1254  ?BP: 140/82  ?Pulse: (!) 53  ?SpO2: 96%  ? ?General: Well Developed, well nourished, and in no acute distress.  ? ?MSK: C-spine: Normal-appearing ?Nontender midline. ?Decreased cervical motion. ?Upper extremity is intact. ? ?Lab and Radiology Results ?EXAM: ?CERVICAL SPINE - COMPLETE 4+ VIEW ?  ?COMPARISON:  None. ?  ?FINDINGS: ?No recent fracture is seen. There is 10 mm smooth marginated ?calcification adjacent to the tip of spinous process of C6 vertebra, ?possibly ligament calcification from previous injury. 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 ?Degenerative changes are noted with large bony spurs from C4-T1 ?levels. There is encroachment of neural foramina at C4-C5 and C5-C6 ?levels. Prevertebral soft tissues are unremarkable. ?  ?IMPRESSION: ?No recent fracture is seen. Cervical spondylosis with large anterior ?bony spurs from C4-T1 levels. There is encroachment of neural ?foramina by bony spurs and facet hypertrophy at C4-C5 and C5-C6 ?levels. ?  ?  ?Electronically Signed ?  By: Elmer Picker M.D. ?  On: 02/10/2022 08:52 ?  ?I, Lynne Leader, personally (independently) visualized and performed the  interpretation of the images attached in this note. ? ? ? ?Impression and Recommendations:   ? ?Assessment and Plan: ?71 y.o. male with chronic right cervical neck pain.  Symptoms ongoing despite good trial of conservative management including physical therapy and physician directed home exercise program.  At this point plan for MRI for cervical facet injection planning.  His pain is mostly right lower so we can dissipate facet joints on the right lower area to be the most likely targeted.  Recheck following MRI.Marland Kitchen ? ?PDMP not reviewed this encounter. ?Orders Placed This Encounter  ?Procedures  ? MR CERVICAL SPINE WO CONTRAST  ?  Standing Status:   Future  ?  Standing Expiration Date:   04/14/2022  ?  Order Specific Question:   What is the patient's sedation requirement?  ?  Answer:   Anti-anxiety  ?  Order Specific Question:   Does the patient have a pacemaker or implanted devices?  ?  Answer:   No  ?  Order Specific Question:   Preferred imaging location?  ?  Answer:   Product/process development scientist (table limit-350lbs)  ? ?Meds ordered this encounter  ?Medications  ? LORazepam (ATIVAN) 0.5 MG tablet  ?  Sig: 1-2 tabs 30 - 60 min prior to MRI. Do not drive with this medicine.  ?  Dispense:  4 tablet  ?  Refill:  0  ? ? ?Discussed warning signs or symptoms. Please see discharge instructions. Patient expresses understanding. ? ? ?The above documentation has been reviewed and is accurate and complete Lynne Leader, M.D. ? ?

## 2022-03-16 ENCOUNTER — Encounter: Payer: PPO | Admitting: Physical Therapy

## 2022-03-20 ENCOUNTER — Ambulatory Visit (INDEPENDENT_AMBULATORY_CARE_PROVIDER_SITE_OTHER): Payer: PPO

## 2022-03-20 DIAGNOSIS — M5412 Radiculopathy, cervical region: Secondary | ICD-10-CM

## 2022-03-20 DIAGNOSIS — M5010 Cervical disc disorder with radiculopathy, unspecified cervical region: Secondary | ICD-10-CM

## 2022-03-22 DIAGNOSIS — H353211 Exudative age-related macular degeneration, right eye, with active choroidal neovascularization: Secondary | ICD-10-CM | POA: Diagnosis not present

## 2022-03-22 DIAGNOSIS — H353122 Nonexudative age-related macular degeneration, left eye, intermediate dry stage: Secondary | ICD-10-CM | POA: Diagnosis not present

## 2022-03-22 NOTE — Progress Notes (Signed)
MRI shows areas of neck arthritis that probably are causing neck pain as well as a potential pinched nerve.  Recommend return to clinic to talk about the results in full detail as well as planning for injections potentially.

## 2022-03-22 NOTE — Progress Notes (Signed)
? ?I, Edward Goodwin, am serving as a Education administrator for Dr. Lynne Leader. ? ?Edward Goodwin is a 71 y.o. male who presents to [place] [facility] today for f/u of neck pain and to review his c-spine MRI.  He was last seen by Dr. Georgina Snell on 03/14/22 w/ con't neck pain since Jan 2023 despite completing 6 PT visits.  He was referred for an MRI and is here to review his results.  Today, pt reports that the neck is no change in pain, but overall has been doing alright.  ? ?Diagnostic testing: C-spine MRI- 03/20/22; C-spine XR- 02/09/22 ? ?Pertinent review of systems: No fevers or chills ? ?Relevant historical information: Ulcerative colitis ? ? ?Exam:  ?BP 110/72   Pulse (!) 52   Ht 5' 9"  (1.753 m)   Wt 192 lb (87.1 kg)   SpO2 98%   BMI 28.35 kg/m?  ?General: Well Developed, well nourished, and in no acute distress.  ? ?MSK: C-spine: Decreased cervical motion.  Upper extremity strength is intact. ? ? ? ?Lab and Radiology Results ?No results found for this or any previous visit (from the past 72 hour(s)). ?MR CERVICAL SPINE WO CONTRAST ? ?Result Date: 03/21/2022 ?CLINICAL DATA:  Cervical radiculopathy, no red flags. Right-sided neck pain EXAM: MRI CERVICAL SPINE WITHOUT CONTRAST TECHNIQUE: Multiplanar, multisequence MR imaging of the cervical spine was performed. No intravenous contrast was administered. COMPARISON:  None Available. 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 Right C2-3 facet edema. No fracture or acute abnormality. Cord: Normal signal and morphology. Posterior Fossa, vertebral arteries, paraspinal tissues: Negative. Disc levels: C1-2: Unremarkable. C2-3: Right facet arthrosis and minimal disc bulge. There is no spinal canal stenosis. No neural foraminal stenosis. C3-4: Partial congenital fusion across the disc space. There is no spinal canal stenosis. No neural foraminal stenosis. C4-5: Small disc bulge with minimal uncovertebral  spurring. Mild right facet hypertrophy. There is no spinal canal stenosis. No neural foraminal stenosis. C5-6: Disc space narrowing with small bulge and uncovertebral spurring, left-greater-than-right. There is no spinal canal stenosis. Mild left neural foraminal stenosis. C6-7: Small disc bulge with uncovertebral spurring. There is no spinal canal stenosis. No neural foraminal stenosis. C7-T1: Mild disc degeneration. There is no spinal canal stenosis. No neural foraminal stenosis. IMPRESSION: 1. Right C2-3 facet edema may be a source of local neck pain. 2. Mild left C5-6 neural foraminal stenosis secondary to combination of disc bulge and uncovertebral spurring. 3. No spinal canal stenosis. 4. 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 Electronically Signed   By: Ulyses Jarred M.D.   On: 03/21/2022 14:22   ? ? ?I, Lynne Leader, personally (independently) visualized and performed the interpretation of the images attached in this note. ? ? ?Assessment and Plan: ?71 y.o. male with right lateral neck pain.  Patient has failed typical conservative management including physical therapy.  MRI was obtained for facet injection planning.  Based on his MRI the most likely target is the right C2-3 facet joint.  This is a little challenging to target and inject.  We spent time talking about options.  Plan to refer to [facility] for evaluation and treatment for potential facet injections. ?Recheck back as needed. ? ?Total encounter time 20 minutes including face-to-face time with the patient and, reviewing past medical record, and charting on the date of service.   ?Reviewed MRI findings discussed treatment plan and options. ? ? ?PDMP not reviewed this encounter. ?Orders Placed This Encounter  ?  Procedures  ? Ambulatory referral to Physical Medicine Rehab  ?  Referral Priority:   Routine  ?  Referral Type:   Rehabilitation  ?  Referral Reason:   Specialty Services Required  ?  Referred to Provider:   Magnus Sinning, MD  ?  Requested Specialty:    Physical Medicine and Rehabilitation  ?  Number of Visits Requested:   1  ? ?No orders of the defined types were placed in this encounter. ? ? ? ?Discussed warning signs or symptoms. Please see discharge instructions. Patient expresses understanding. ? ? ?The above documentation has been reviewed and is accurate and complete Lynne Leader, M.D. ? ? ?

## 2022-03-24 ENCOUNTER — Ambulatory Visit: Payer: PPO | Admitting: Family Medicine

## 2022-03-24 VITALS — BP 110/72 | HR 52 | Ht 69.0 in | Wt 192.0 lb

## 2022-03-24 DIAGNOSIS — M5010 Cervical disc disorder with radiculopathy, unspecified cervical region: Secondary | ICD-10-CM

## 2022-03-24 NOTE — Patient Instructions (Addendum)
Good to see you  ?Referral placed to Dr. Ernestina Patches they will call to schedule ?Check back as needed  ? ?

## 2022-03-29 ENCOUNTER — Telehealth: Payer: Self-pay | Admitting: Physical Medicine and Rehabilitation

## 2022-03-29 NOTE — Telephone Encounter (Signed)
Patient called asked for a call back concerning his appointment. Patient asked to see Dr Ernestina Patches for his visit. Patient was upset and wants a call back as soon as possible. The number to contact patient is [phone] ?

## 2022-03-30 ENCOUNTER — Encounter: Payer: Self-pay | Admitting: Physical Medicine and Rehabilitation

## 2022-03-30 ENCOUNTER — Ambulatory Visit: Payer: PPO | Admitting: Physical Medicine and Rehabilitation

## 2022-03-30 VITALS — BP 148/82 | HR 62

## 2022-03-30 DIAGNOSIS — M47816 Spondylosis without myelopathy or radiculopathy, lumbar region: Secondary | ICD-10-CM | POA: Diagnosis not present

## 2022-03-30 DIAGNOSIS — M542 Cervicalgia: Secondary | ICD-10-CM

## 2022-03-30 NOTE — Progress Notes (Signed)
Edward Goodwin - 71 y.o. male MRN [id number]  Date of birth: 1950-11-25  Office Visit Note: Visit Date: 03/30/2022 PCP: [name], MD Referred by: Gregor Hams, MD  Subjective: Chief Complaint  Patient presents with   Neck - Pain   Right Shoulder - Pain   HPI: Edward Goodwin is a 71 y.o. male who comes in today at the request of Dr. Lynne Leader for evaluation of chronic, worsening and severe right sided neck pain.  Patient reports pain has been ongoing for 4 months and is exacerbated by movement and activity. He describes his pain as a sore, dull and aching sensation.  Patient states he is not able to put a number on his pain at this time however states his pain is most severe with side-to-side rotation of his neck.  Patient also states his pain is worse in the morning.  Patient reports some relief of pain with home exercise regimen, rest and over-the-counter medications. Patient recently completed regimen of physical therapy with [facility] and reports no relief of pain with treatments. Patient's recent cervical MRI exhibits right C2-C3 facet edema, no high-grade spinal canal stenosis noted. Patient denies history of cervical injections/surgery. Patient states his pain is negatively impacting his life, states his pain increases with activities such as golf.  Patient was recently see by Dr. Lynne Leader for evaluation, per his notes he recommends C2-C3 facet joint injections. Patient denies focal weakness, numbness and tingling. Patient denies recent trauma or falls.   Review of Systems  Musculoskeletal:  Positive for neck pain.  Neurological:  Negative for tingling, sensory change, focal weakness and weakness.  Otherwise per HPI.  Assessment & Plan: Visit Diagnoses:    ICD-10-CM   1. Cervicalgia  M54.2 Ambulatory referral to Physical Medicine Rehab    2. Neck pain on right side  M54.2 Ambulatory referral to Physical Medicine Rehab    3. Facet arthropathy,  lumbar  M47.816 Ambulatory referral to Physical Medicine Rehab       Plan: Findings:  Chronic, worsening and severe right sided neck pain. No radicular symptoms noted. Patient continues to have severe pain despite good conservative therapies such as formal physical therapy, home exercise regimen, rest and use of medications. Patients clinical presentation and exam are consistent with facet mediated pain. There is right facet arthrosis noted at the level of C2-C3. We believe the next step is to perform a diagnostic and hopefully therapeutic right C2-C3 facet joint injection. If patient gets good relief with facet joint injection there is a possibility for longer sustained pain relief with radiofrequency ablation procedure. I did explain cervical facet joint injection procedure to patient, I did reassure him that we can perform this injection safely under fluoroscopy. Patient would like to proceed with injection, we will work to get him scheduled. Patient encouraged to remain active and to continue with home exercise regimen as tolerated. No red flag symptoms noted upon exam today.    Meds & Orders: No orders of the defined types were placed in this encounter.   Orders Placed This Encounter  Procedures   Ambulatory referral to Physical Medicine Rehab    Follow-up: Return for Right C2-C3 facet joint injection.   Procedures: No procedures performed      Clinical History: EXAM: MRI CERVICAL SPINE WITHOUT CONTRAST   TECHNIQUE: Multiplanar, multisequence MR imaging of the cervical spine was performed. No intravenous contrast was administered.   COMPARISON:  None Available.   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 Right C2-3 facet edema. No fracture or acute abnormality.   Cord: Normal signal and morphology.   Posterior Fossa, vertebral arteries, paraspinal tissues: Negative.   Disc levels:   C1-2: Unremarkable.   C2-3:  Right facet arthrosis and minimal disc bulge. There is no spinal canal stenosis. No neural foraminal stenosis.   C3-4: Partial congenital fusion across the disc space. There is no spinal canal stenosis. No neural foraminal stenosis.   C4-5: Small disc bulge with minimal uncovertebral spurring. Mild right facet hypertrophy. There is no spinal canal stenosis. No neural foraminal stenosis.   C5-6: Disc space narrowing with small bulge and uncovertebral spurring, left-greater-than-right. There is no spinal canal stenosis. Mild left neural foraminal stenosis.   C6-7: Small disc bulge with uncovertebral spurring. There is no spinal canal stenosis. No neural foraminal stenosis.   C7-T1: Mild disc degeneration. There is no spinal canal stenosis. No neural foraminal stenosis.   IMPRESSION: 1. Right C2-3 facet edema may be a source of local neck pain. 2. Mild left C5-6 neural foraminal stenosis secondary to combination of disc bulge and uncovertebral spurring. 3. No spinal canal stenosis. 4. 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     Electronically Signed   By: Ulyses Jarred M.D.   On: 03/21/2022 14:22   He reports that he quit smoking about 32 years ago. His smoking use included cigarettes. He has never used smokeless tobacco.  Recent Labs    12/16/21 1031  LABURIC 6.5    Objective:  VS:  HT:    WT:   BMI:     BP: (!) 148/82  HR:62bpm  TEMP: ( )  RESP:  Physical Exam Vitals and nursing note reviewed.  HENT:     Head: Normocephalic and atraumatic.     Right Ear: External ear normal.     Left Ear: External ear normal.     Nose: Nose normal.     Mouth/Throat:     Mouth: Mucous membranes are moist.  Eyes:     Extraocular Movements: Extraocular movements intact.  Cardiovascular:     Pulses: Normal pulses.  Pulmonary:     Effort: Pulmonary effort is normal.  Abdominal:     General: Abdomen is flat. There is no distension.  Musculoskeletal:        General: Tenderness  present.     Cervical back: Tenderness present.     Comments: Discomfort noted with side-to-side rotation. Patient has good strength in the upper extremities including 5 out of 5 strength in wrist extension, long finger flexion and APB. There is no atrophy of the hands intrinsically.  Sensation intact bilaterally. Negative Hoffman's sign.   Skin:    General: Skin is warm and dry.     Capillary Refill: Capillary refill takes less than 2 seconds.  Neurological:     General: No focal deficit present.     Mental Status: He is alert and oriented to person, place, and time.  Psychiatric:        Mood and Affect: Mood normal.        Behavior: Behavior normal.    Ortho Exam  Imaging: No results found.  Past Medical/Family/Surgical/Social History: Medications & Allergies reviewed per EMR, new medications updated. Patient Active Problem List   Diagnosis Date Noted   nonobstructive CAD (coronary artery disease)- CAC 247 on 05/03/2018  12/16/2021   Abdominal aortic aneurysm (AAA) without rupture, unspecified part ([place])  Former smoker 04/19/2018   Hyperlipidemia 04/19/2018   Vertigo 12/19/2017   Onychomycosis 12/19/2017   Trochanteric bursitis of left hip 03/31/2015   Gout 03/24/2014   Elevated PSA, less than 10 ng/ml 09/09/2013   Macular degeneration, bilateral 07/15/2012   Obesity (BMI 30.0-34.9) 12/02/2008   Essential hypertension 08/20/2007   Ulcerative colitis ([place]) 08/20/2007   PSORIASIS 08/20/2007   Past Medical History:  Diagnosis Date   Chicken pox    Colitis, ulcerative ([place])    Hypertension    Macular degeneration, bilateral 07/2012   areds 2    Psoriasis    Family History  Problem Relation Age of Onset   Heart disease Mother    Breast cancer Mother    Hypertension Mother    Stroke Mother    Hypothyroidism Mother    Dementia Mother    Heart disease Father        CAD/MI 18s or 53s smoker. led to death age 53.    Hypertension Father    Diabetes Daughter     Diabetes Maternal Uncle    Hepatitis C Brother    Stroke Maternal Grandmother    Emphysema Maternal Grandfather    Arthritis Paternal Grandmother    Colon cancer Neg Hx    Esophageal cancer Neg Hx    Stomach cancer Neg Hx    Rectal cancer Neg Hx    Past Surgical History:  Procedure Laterality Date   ACNE CYST REMOVAL     CATARACT EXTRACTION W/ INTRAOCULAR LENS IMPLANT  10/2014   bilat   COLONOSCOPY     FOOT SURGERY  1988   SHOULDER ARTHROSCOPY  2007   Right   TONSILLECTOMY     Social History   Occupational History   Occupation: Herbalist    Comment: retired   Tobacco Use   Smoking status: Former    Types: Cigarettes    Quit date: 10/23/1989    Years since quitting: 32.4   Smokeless tobacco: Never  Vaping Use   Vaping Use: Never used  Substance and Sexual Activity   Alcohol use: Yes    Alcohol/week: 0.0 standard drinks    Comment: rare glass of wine - less than once a year   Drug use: No   Sexual activity: Not Currently

## 2022-03-30 NOTE — Progress Notes (Signed)
Pt state neck pain that travels to his right shoulder. Pt state any movement with his head or neck makes the pain worse. Pt state he takes over the counter pain meds and uses heat and ice to help ease his pain.  Numeric Pain Rating Scale and Functional Assessment Average Pain 8 Pain Right Now 5 My pain is intermittent, sharp, and dull Pain is worse with: bending, some activites, and turnign his head. Pain improves with: heat/ice, therapy/exercise, and medication   In the last MONTH (on 0-10 scale) has pain interfered with the following?  1. General activity like being  able to carry out your everyday physical activities such as walking, climbing stairs, carrying groceries, or moving a chair?  Rating(5)  2. Relation with others like being able to carry out your usual social activities and roles such as  activities at home, at work and in your community. Rating(6)  3. Enjoyment of life such that you have  been bothered by emotional problems such as feeling anxious, depressed or irritable?  Rating(7)

## 2022-04-14 DIAGNOSIS — D1801 Hemangioma of skin and subcutaneous tissue: Secondary | ICD-10-CM | POA: Diagnosis not present

## 2022-04-14 DIAGNOSIS — L821 Other seborrheic keratosis: Secondary | ICD-10-CM | POA: Diagnosis not present

## 2022-04-14 DIAGNOSIS — L400 Psoriasis vulgaris: Secondary | ICD-10-CM | POA: Diagnosis not present

## 2022-04-14 DIAGNOSIS — L814 Other melanin hyperpigmentation: Secondary | ICD-10-CM | POA: Diagnosis not present

## 2022-04-16 ENCOUNTER — Emergency Department (HOSPITAL_BASED_OUTPATIENT_CLINIC_OR_DEPARTMENT_OTHER)
Admission: EM | Admit: 2022-04-16 | Discharge: 2022-04-16 | Disposition: A | Discharge status: Home / Self Care | Payer: PPO | Attending: Emergency Medicine | Admitting: Emergency Medicine

## 2022-04-16 ENCOUNTER — Encounter (HOSPITAL_BASED_OUTPATIENT_CLINIC_OR_DEPARTMENT_OTHER): Payer: Self-pay | Admitting: Emergency Medicine

## 2022-04-16 ENCOUNTER — Emergency Department (HOSPITAL_BASED_OUTPATIENT_CLINIC_OR_DEPARTMENT_OTHER): Payer: PPO

## 2022-04-16 ENCOUNTER — Other Ambulatory Visit: Payer: Self-pay

## 2022-04-16 DIAGNOSIS — Z7901 Long term (current) use of anticoagulants: Secondary | ICD-10-CM | POA: Diagnosis not present

## 2022-04-16 DIAGNOSIS — I10 Essential (primary) hypertension: Secondary | ICD-10-CM | POA: Diagnosis not present

## 2022-04-16 DIAGNOSIS — I2510 Atherosclerotic heart disease of native coronary artery without angina pectoris: Secondary | ICD-10-CM | POA: Diagnosis not present

## 2022-04-16 DIAGNOSIS — R531 Weakness: Secondary | ICD-10-CM | POA: Diagnosis present

## 2022-04-16 DIAGNOSIS — I4819 Other persistent atrial fibrillation: Secondary | ICD-10-CM | POA: Diagnosis not present

## 2022-04-16 DIAGNOSIS — R0602 Shortness of breath: Secondary | ICD-10-CM | POA: Insufficient documentation

## 2022-04-16 DIAGNOSIS — I4891 Unspecified atrial fibrillation: Secondary | ICD-10-CM | POA: Diagnosis not present

## 2022-04-16 LAB — CBC WITH DIFFERENTIAL/PLATELET
Abs Immature Granulocytes: 0.01 10*3/uL (ref 0.00–0.07)
Basophils Absolute: 0 10*3/uL (ref 0.0–0.1)
Basophils Relative: 0 %
Eosinophils Absolute: 0.1 10*3/uL (ref 0.0–0.5)
Eosinophils Relative: 1 %
HCT: 40.6 % (ref 39.0–52.0)
Hemoglobin: 13.2 g/dL (ref 13.0–17.0)
Immature Granulocytes: 0 %
Lymphocytes Relative: 16 %
Lymphs Abs: 0.9 10*3/uL (ref 0.7–4.0)
MCH: 27.7 pg (ref 26.0–34.0)
MCHC: 32.5 g/dL (ref 30.0–36.0)
MCV: 85.3 fL (ref 80.0–100.0)
Monocytes Absolute: 0.6 10*3/uL (ref 0.1–1.0)
Monocytes Relative: 10 %
Neutro Abs: 4.1 10*3/uL (ref 1.7–7.7)
Neutrophils Relative %: 73 %
Platelets: 163 10*3/uL (ref 150–400)
RBC: 4.76 MIL/uL (ref 4.22–5.81)
RDW: 13.9 % (ref 11.5–15.5)
WBC: 5.6 10*3/uL (ref 4.0–10.5)
nRBC: 0 % (ref 0.0–0.2)

## 2022-04-16 LAB — COMPREHENSIVE METABOLIC PANEL
ALT: 9 U/L (ref 0–44)
AST: 12 U/L — ABNORMAL LOW (ref 15–41)
Albumin: 4.3 g/dL (ref 3.5–5.0)
Alkaline Phosphatase: 55 U/L (ref 38–126)
Anion gap: 10 (ref 5–15)
BUN: 21 mg/dL (ref 8–23)
CO2: 25 mmol/L (ref 22–32)
Calcium: 9.4 mg/dL (ref 8.9–10.3)
Chloride: 106 mmol/L (ref 98–111)
Creatinine, Ser: 1.03 mg/dL (ref 0.61–1.24)
GFR, Estimated: >60 mL/min (ref >60)
Glucose, Bld: 93 mg/dL (ref 70–99)
Potassium: 4.3 mmol/L (ref 3.5–5.1)
Sodium: 141 mmol/L (ref 135–145)
Total Bilirubin: 0.9 mg/dL (ref 0.3–1.2)
Total Protein: 6.8 g/dL (ref 6.5–8.1)

## 2022-04-16 LAB — MAGNESIUM: Magnesium: 2.1 mg/dL (ref 1.7–2.4)

## 2022-04-16 LAB — BRAIN NATRIURETIC PEPTIDE: B Natriuretic Peptide: 402.8 pg/mL — ABNORMAL HIGH (ref 0.0–100.0)

## 2022-04-16 MED ORDER — SODIUM CHLORIDE 0.9 % IV BOLUS
1000.0000 mL | Freq: Once | INTRAVENOUS | Status: AC
Start: 2022-04-16 — End: 2022-04-16
  Administered 2022-04-16: 1000 mL via INTRAVENOUS

## 2022-04-16 MED ORDER — APIXABAN 5 MG PO TABS: 5.0000 mg | ORAL_TABLET | Freq: Two times a day (BID) | ORAL | 0 refills | Status: DC

## 2022-04-16 MED ORDER — APIXABAN 2.5 MG PO TABS
5.0000 mg | ORAL_TABLET | Freq: Two times a day (BID) | ORAL | Status: DC
  Administered 2022-04-16: 5 mg via ORAL
  Filled 2022-04-16: qty 2

## 2022-04-16 MED ORDER — DILTIAZEM HCL 30 MG PO TABS: 30.0000 mg | ORAL_TABLET | Freq: Every day | ORAL | 0 refills | Status: DC | PRN

## 2022-04-16 NOTE — ED Triage Notes (Signed)
Pt arrives to ED with c/o weakness and hypotension. Pt reports that he has been extremely weak since he woke up yesterday. He reports that he feels like his heart is "skipping beats." He states that his blood pressure has been in the 75'O systolic at home.

## 2022-04-16 NOTE — Discharge Instructions (Addendum)
Information on my medicine - ELIQUIS (apixaban)  This medication education was reviewed with me or my healthcare representative as part of my discharge preparation.   Why was Eliquis prescribed for you? Eliquis was prescribed for you to reduce the risk of a blood clot forming that can cause a stroke if you have a medical condition called atrial fibrillation (a type of irregular heartbeat).  What do You need to know about Eliquis ? Take your Eliquis TWICE DAILY - one tablet in the morning and one tablet in the evening with or without food. If you have difficulty swallowing the tablet whole please discuss with your pharmacist how to take the medication safely.  Take Eliquis exactly as prescribed by your doctor and DO NOT stop taking Eliquis without talking to the doctor who prescribed the medication.  Stopping may increase your risk of developing a stroke.  Refill your prescription before you run out.  After discharge, you should have regular check-up appointments with your healthcare provider that is prescribing your Eliquis.  In the future your dose may need to be changed if your kidney function or weight changes by a significant amount or as you get older.  What do you do if you miss a dose? If you miss a dose, take it as soon as you remember on the same day and resume taking twice daily.  Do not take more than one dose of ELIQUIS at the same time to make up a missed dose.  Important Safety Information A possible side effect of Eliquis is bleeding. You should call your healthcare provider right away if you experience any of the following: Bleeding from an injury or your nose that does not stop. Unusual colored urine (red or dark brown) or unusual colored stools (red or black). Unusual bruising for unknown reasons. A serious fall or if you hit your head (even if there is no bleeding).  Some medicines may interact with Eliquis and might increase your risk of bleeding or clotting  while on Eliquis. To help avoid this, consult your healthcare provider or pharmacist prior to using any new prescription or non-prescription medications, including herbals, vitamins, non-steroidal anti-inflammatory drugs (NSAIDs) and supplements.  This website has more information on Eliquis (apixaban): http://www.eliquis.com/eliquis/home    You are seen in the ER for weakness, heart palpitations.  Your symptoms are because of a condition called atrial fibrillation.  This condition puts you at high risk for having stroke.  Which is why we will start you on a blood thinner called Eliquis.  Please read the instructions provided on Eliquis.  Take the medications regularly.  Additionally, since your blood pressure is running slightly low we recommend that you discontinue your lisinopril.   Continue to monitor your heart rate and blood pressure periodically.  If your heart rate is over 110, and you are having symptoms like weakness, chest pain, shortness of breath, dizziness and your blood pressure is over 100/60 -you may take diltiazem tablet to see if it helps your symptoms.  If there is no improvement despite the medication in an hour, then return to the ER.    If you are having the symptoms and have elevated heart rate but your blood pressure is less than 100/60, then please do not take diltiazem and just come to the ER for further evaluation.

## 2022-04-16 NOTE — Progress Notes (Signed)
ANTICOAGULATION CONSULT NOTE - Initial Consult  Pharmacy Consult for Apixaban Indication: atrial fibrillation  Allergies  Allergen Reactions   Sulfonamide Derivatives     "have just been told that my entire life"   Patient Measurements: Height: 5' 9"  (175.3 cm) Weight: 83.9 kg (185 lb) IBW/kg (Calculated) : 70.7  Vital Signs: Temp: 98.1 F (36.7 C) (06/03 0813) Temp Source: Oral (06/03 0813) BP: 124/90 (06/03 1000) Pulse Rate: 82 (06/03 1000)  Labs: Recent Labs    04/16/22 0807  HGB 13.2  HCT 40.6  PLT 163  CREATININE 1.03   Estimated Creatinine Clearance: 64.8 mL/min (by C-G formula based on SCr of 1.03 mg/dL).  Medical History: Past Medical History:  Diagnosis Date   Chicken pox    Colitis, ulcerative ([place])    Hypertension    Macular degeneration, bilateral 07/2012   areds 2    Psoriasis    Medications:  (Not in a hospital admission)   Assessment: 36 yom with a history of HTN, HLD, CAD. Presenting with weakness and "heart skipping beats". Apixaban per pharmacy consult placed for new onset AF.  83.9 kg SCr 1.03 Hgb 13.2; plt 163  Goal of Therapy:  Monitor platelets by anticoagulation protocol: Yes   Plan:  Apixaban 32m BID Monitor for s/s of bleeding and H/H  JLorelei Pont PharmD, BCPS 04/16/2022 10:17 AM ED Clinical Pharmacist -  [phone]

## 2022-04-16 NOTE — ED Provider Notes (Signed)
[facility] EMERGENCY DEPT Provider Note   CSN: [id number] Arrival date & time: 04/16/22  0758     History  Chief Complaint  Patient presents with   Hypotension   Weakness    Edward Goodwin is a 71 y.o. male.  HPI     71 year old male comes in with chief complaint of low blood pressure and weakness.  Patient has history of hypertension, hyperlipidemia and nonobstructive CAD.  Patient indicates that over the last 2 days he has been feeling weak and noticing that his heart is skipping beats.  Because of his symptoms, he has been checking his heart rate and blood pressure throughout the day, and his blood pressure is running lower than normal.  Normally his blood pressure is in the 110s to 341 range systolic.  Now it is in the 90-110 range predominantly.  Normally patient's heart rate is in the 50s to 60s range, but the heart rate now has been 80 and above.  He denies any associated chest pain, shortness of breath.  On occasion, with standing up he has felt dizzy.  No recent illnesses.  Home Medications Prior to Admission medications   Medication Sig Start Date End Date Taking? Authorizing Provider  apixaban (ELIQUIS) 5 MG TABS tablet Take 1 tablet (5 mg total) by mouth 2 (two) times daily. 04/16/22 05/16/22 Yes Varney Biles, MD  diltiazem (CARDIZEM) 30 MG tablet Take 1 tablet (30 mg total) by mouth daily as needed for up to 5 doses. 04/16/22  Yes Varney Biles, MD  Ascorbic Acid (VITAMIN C PO) Take 1 tablet by mouth daily. 1000 mg    [provider]  atorvastatin (LIPITOR) 20 MG tablet TAKE 1 TABLET BY MOUTH EVERY DAY 03/09/22   [name], MD  clobetasol ointment (TEMOVATE) 0.05 % Apply topically 2 (two) times daily as needed. 04/08/20   [provider]  cyclobenzaprine (FLEXERIL) 5 MG tablet Take 1 tablet (5 mg total) by mouth at bedtime as needed for muscle spasms. 02/09/22   [name], MD  L-Lysine 1000 MG TABS Take 500 mg by mouth  daily.     [provider]  meclizine (ANTIVERT) 25 MG tablet Take 1 tablet (25 mg total) by mouth every 6 (six) hours as needed for dizziness. 12/19/17   Laurey Morale, MD  Multiple Vitamins-Minerals (PRESERVISION AREDS 2) CAPS Take 1 capsule by mouth 2 (two) times daily before lunch and supper. 07/16/12   [provider]      Allergies    Sulfonamide derivatives    Review of Systems   Review of Systems  All other systems reviewed and are negative.  Physical Exam Updated Vital Signs BP 117/87   Pulse 85   Temp 98.1 F (36.7 C) (Oral)   Resp 18   Ht 5' 9"  (1.753 m)   Wt 83.9 kg   SpO2 98%   BMI 27.32 kg/m  Physical Exam Vitals and nursing note reviewed.  Constitutional:      Appearance: He is well-developed.  HENT:     Head: Atraumatic.  Cardiovascular:     Rate and Rhythm: Normal rate. Rhythm irregular.  Pulmonary:     Effort: Pulmonary effort is normal.  Musculoskeletal:     Cervical back: Neck supple.     Right lower leg: No edema.     Left lower leg: No edema.  Skin:    General: Skin is warm.  Neurological:     Mental Status: He is alert and  oriented to person, place, and time.    ED Results / Procedures / Treatments   Labs (all labs ordered are listed, but only abnormal results are displayed) Labs Reviewed  COMPREHENSIVE METABOLIC PANEL - Abnormal; Notable for the following components:      Result Value   AST 12 (*)    All other components within normal limits  BRAIN NATRIURETIC PEPTIDE - Abnormal; Notable for the following components:   B Natriuretic Peptide 402.8 (*)    All other components within normal limits  CBC WITH DIFFERENTIAL/PLATELET  MAGNESIUM    EKG EKG Interpretation  Date/Time:  Saturday April 16 2022 08:10:14 EDT Ventricular Rate:  77 PR Interval:    QRS Duration: 88 QT Interval:  364 QTC Calculation: 412 R Axis:   72 Text Interpretation: Atrial fibrillation Borderline low voltage, extremity leads afib is new  Confirmed by Varney Biles [phone]) on 04/16/2022 8:52:40 AM  Radiology DG Chest Port 1 View  Result Date: 04/16/2022 CLINICAL DATA:  Atrial fibrillation. EXAM: PORTABLE CHEST 1 VIEW COMPARISON:  12/16/2021 and prior studies FINDINGS: The cardiomediastinal silhouette is unremarkable. Elevation of the RIGHT hemidiaphragm, RIGHT mid lung calcified granuloma and calcified RIGHT hilar lymph nodes are again noted. There is no evidence of focal airspace disease, pulmonary edema, suspicious pulmonary nodule/mass, pleural effusion, or pneumothorax. No acute bony abnormalities are identified. IMPRESSION: No active disease. Electronically Signed   By: Margarette Canada M.D.   On: 04/16/2022 09:00    Procedures Procedures    Medications Ordered in ED Medications  sodium chloride 0.9 % bolus 1,000 mL (1,000 mLs Intravenous New Bag/Given 04/16/22 0910)    ED Course/ Medical Decision Making/ A&P       CHA2DS2-VASc Score: 2                    Medical Decision Making Amount and/or Complexity of Data Reviewed Labs: ordered. Radiology: ordered.  Risk Prescription drug management.   This patient presents to the ED with chief complaint(s) of weakness, low blood pressure with pertinent past medical history of hypertension, hyperlipidemia and nonobstructive CAD which further complicates the presenting complaint. The complaint involves an extensive differential diagnosis and also carries with it a high risk of complications and morbidity.    Patient found to have new onset atrial fibrillation, which is likely the culprit  The differential diagnosis includes : Atrial fibrillation, anemia, electrolyte abnormality, hypovolemia for the weakness and dizziness. Differential diagnosis for new onset A-fib includes electrolyte abnormality, longstanding hypertension, structural changes to the heart.  History is not indicative of any underlying PE, no signs of DVT and patient does not appear to have thyrotoxicosis, toxidrome  or decompensated CHF.  The initial plan is to order basic blood work and get an x-ray.  We will need to discuss the case with cardiology service.  Fortunately, at this time patient is rate controlled.  He did not take his nighttime lisinopril, therefore his blood pressure is also appearing better than it did yesterday.   Additional history obtained: Additional history obtained from spouse Records reviewed  patient's serial blood pressure logs, heart rate logs  Independent labs interpretation:  The following labs were independently interpreted: CBC with no anemia, metabolic profile with no renal failure  Independent visualization of imaging: - I independently visualized the following imaging with scope of interpretation limited to determining acute life threatening conditions related to emergency care: X-ray of the chest, which revealed no evidence of pulmonary edema  Treatment and Reassessment: Results of  the ER discussed with the patient.  Consultation: - Consulted or discussed management/test interpretation w/ external professional: Case discussed with Dr. Percival Spanish, cardiology.  Discussed patient's new onset A-fib with rate control, slightly low blood pressure and associated symptoms.  Also discussed elevated BNP in the setting of no evidence of volume overload or decompensated CHF.  Dr. Percival Spanish recommends that we discontinue lisinopril.  We can start anticoagulation if there is no contraindication.  Patient can be seen in the outpatient setting by A-fib clinic.  Additionally, him and I discussed utility of as needed rate control medication like diltiazem.  He is okay with either 12.5 of Lopressor or 30 mg of diltiazem.   Consideration for admission or further workup: Admission was considered for symptomatic A-fib, however patient is not profoundly decompensated and we are able to establish close follow-up.  Patient is reliable, will take Eliquis, will return to the ER if his symptoms are  getting worse, and we were able to connect with cardiology who can see him quickly therefore we will not proceed with admission for this index visit.   Final Clinical Impression(s) / ED Diagnoses Final diagnoses:  Persistent atrial fibrillation ([place])    Rx / DC Orders ED Discharge Orders          Ordered    Amb referral to AFIB Clinic        04/16/22 0820    apixaban (ELIQUIS) 5 MG TABS tablet  2 times daily        04/16/22 0956    diltiazem (CARDIZEM) 30 MG tablet  Daily PRN        04/16/22 0956              Varney Biles, MD 04/16/22 1001

## 2022-04-16 NOTE — ED Notes (Signed)
Discharge instructions and follow up care with afib clinic reviewed and explained. Eliquis information pamphlet provided, pharmacy tech Roderic Palau spoke with pt providing information about Eliquis (precautions and compliance). Pt and wife both states they understand all discharge instructions provided and had no further questions at discharge. Pt caox4, ambulatory and in no obvious distress on departure.

## 2022-04-21 ENCOUNTER — Ambulatory Visit (HOSPITAL_COMMUNITY)
Admission: RE | Admit: 2022-04-21 | Discharge: 2022-04-21 | Disposition: A | Discharge status: Home / Self Care | Payer: PPO | Source: Ambulatory Visit | Attending: Physician Assistant | Admitting: Physician Assistant

## 2022-04-21 VITALS — BP 166/72 | HR 57 | Ht 69.0 in | Wt 194.0 lb

## 2022-04-21 DIAGNOSIS — I10 Essential (primary) hypertension: Secondary | ICD-10-CM | POA: Diagnosis not present

## 2022-04-21 DIAGNOSIS — I2510 Atherosclerotic heart disease of native coronary artery without angina pectoris: Secondary | ICD-10-CM | POA: Diagnosis not present

## 2022-04-21 DIAGNOSIS — I480 Paroxysmal atrial fibrillation: Secondary | ICD-10-CM | POA: Insufficient documentation

## 2022-04-21 DIAGNOSIS — R0683 Snoring: Secondary | ICD-10-CM | POA: Insufficient documentation

## 2022-04-21 DIAGNOSIS — D6869 Other thrombophilia: Secondary | ICD-10-CM | POA: Diagnosis not present

## 2022-04-21 DIAGNOSIS — E785 Hyperlipidemia, unspecified: Secondary | ICD-10-CM | POA: Diagnosis not present

## 2022-04-21 DIAGNOSIS — Z7901 Long term (current) use of anticoagulants: Secondary | ICD-10-CM | POA: Diagnosis not present

## 2022-04-21 MED ORDER — APIXABAN 5 MG PO TABS: 5.0000 mg | ORAL_TABLET | Freq: Two times a day (BID) | ORAL | 3 refills | Status: DC

## 2022-04-21 MED ORDER — DILTIAZEM HCL 30 MG PO TABS: 30.0000 mg | ORAL_TABLET | Freq: Every day | ORAL | 1 refills | Status: DC | PRN

## 2022-04-21 NOTE — Patient Instructions (Signed)
Resume Eliquis on June 16th

## 2022-04-21 NOTE — Progress Notes (Signed)
Primary Care Physician: [name], MD Primary Cardiologist: none Primary Electrophysiologist: none Referring Physician: [facility]   Edward Goodwin is a 71 y.o. male with a history of HTN, HLD, nonobstructive CAD, atrial fibrillation who presents for consultation in the [place].  The patient was initially diagnosed with atrial fibrillation 04/16/22 after presenting to the ED with symptoms of weakness, "skipped beats", and low BP. ECG showed rate controlled afib. His lisinopril was held and he was started on diltiazem PRN for heart racing. Patient was also started on Eliquis for a CHADS2VASC score of 3. He felt he was back in SR after leaving the ED and has not had any further symptoms. He stopped Eliquis because he was concerned that he would not be able to have his macular degeneration injection or his epidural injection. He resumed his lisinopril. He does admit to snoring and witnessed apnea.   Today, he denies symptoms of palpitations, chest pain, shortness of breath, orthopnea, PND, lower extremity edema, dizziness, presyncope, syncope, bleeding, or neurologic sequela. The patient is tolerating medications without difficulties and is otherwise without complaint today.    Atrial Fibrillation Risk Factors:  he does have symptoms or diagnosis of sleep apnea. he is agreeable to sleep study.  he does not have a history of rheumatic fever.   he has a BMI of Body mass index is 28.65 kg/m.Marland Kitchen Filed Weights   04/21/22 1519  Weight: 88 kg    Family History  Problem Relation Age of Onset   Heart disease Mother    Breast cancer Mother    Hypertension Mother    Stroke Mother    Hypothyroidism Mother    Dementia Mother    Heart disease Father        CAD/MI 30s or 36s smoker. led to death age 60.    Hypertension Father    Diabetes Daughter    Diabetes Maternal Uncle    Hepatitis C Brother    Stroke Maternal Grandmother    Emphysema Maternal  Grandfather    Arthritis Paternal Grandmother    Colon cancer Neg Hx    Esophageal cancer Neg Hx    Stomach cancer Neg Hx    Rectal cancer Neg Hx      Atrial Fibrillation Management history:  Previous antiarrhythmic drugs: none Previous cardioversions: none Previous ablations: none CHADS2VASC score: 3 Anticoagulation history: Eliquis   Past Medical History:  Diagnosis Date   Chicken pox    Colitis, ulcerative ([place])    Hypertension    Macular degeneration, bilateral 07/2012   areds 2    Psoriasis    Past Surgical History:  Procedure Laterality Date   ACNE CYST REMOVAL     CATARACT EXTRACTION W/ INTRAOCULAR LENS IMPLANT  10/2014   bilat   COLONOSCOPY     FOOT SURGERY  1988   SHOULDER ARTHROSCOPY  2007   Right   TONSILLECTOMY      Current Outpatient Medications  Medication Sig Dispense Refill   Ascorbic Acid (VITAMIN C PO) Take 1 tablet by mouth daily. 1000 mg     atorvastatin (LIPITOR) 20 MG tablet TAKE 1 TABLET BY MOUTH EVERY DAY 90 tablet 3   clobetasol ointment (TEMOVATE) 0.05 % Apply topically 2 (two) times daily as needed.     cyclobenzaprine (FLEXERIL) 5 MG tablet Take 1 tablet (5 mg total) by mouth at bedtime as needed for muscle spasms. 15 tablet 0   diltiazem (CARDIZEM) 30 MG tablet Take  1 tablet (30 mg total) by mouth daily as needed for up to 5 doses. 5 tablet 0   L-Lysine 1000 MG TABS Take 500 mg by mouth daily.      lisinopril (ZESTRIL) 20 MG tablet Take 20 mg by mouth daily.     meclizine (ANTIVERT) 25 MG tablet Take 1 tablet (25 mg total) by mouth every 6 (six) hours as needed for dizziness. 60 tablet 2   Multiple Vitamins-Minerals (PRESERVISION AREDS 2) CAPS Take 1 capsule by mouth 2 (two) times daily before lunch and supper.     Propylene Glycol (SYSTANE COMPLETE OP) Apply 1 drop to eye 2 (two) times daily.     apixaban (ELIQUIS) 5 MG TABS tablet Take 1 tablet (5 mg total) by mouth 2 (two) times daily. (Patient not taking: Reported on 04/21/2022) 60  tablet 0   No current facility-administered medications for this encounter.    Allergies  Allergen Reactions   Sulfonamide Derivatives     "have just been told that my entire life"    Social History   Socioeconomic History   Marital status: Married    Spouse name: Not on file   Number of children: 1   Years of education: 12   Highest education level: Not on file  Occupational History   Occupation: Herbalist    Comment: retired   Tobacco Use   Smoking status: Former    Types: Cigarettes    Quit date: 10/23/1989    Years since quitting: 32.5   Smokeless tobacco: Never  Vaping Use   Vaping Use: Never used  Substance and Sexual Activity   Alcohol use: Yes    Alcohol/week: 0.0 standard drinks of alcohol    Comment: rare glass of wine - less than once a year   Drug use: No   Sexual activity: Not Currently  Other Topics Concern   Not on file  Social History Narrative   Married 1971. 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 [employer] 6 years.       ACP: HCPOA - wife. Yes for acute CPR, no for prolonged mechanical ventilatory support, no for prolonged artificial nutrition or other heroic measures leaving him in an incapacitated state.    Social Determinants of Health   Financial Resource Strain: Low Risk  (01/27/2022)   Overall Financial Resource Strain (CARDIA)    Difficulty of Paying Living Expenses: Not hard at all  Food Insecurity: No Food Insecurity (01/27/2022)   Hunger Vital Sign    Worried About Running Out of Food in the Last Year: Never true    Ran Out of Food in the Last Year: Never true  Transportation Needs: No Transportation Needs (01/27/2022)   PRAPARE - Hydrologist (Medical): No    Lack of Transportation (Non-Medical): No  Physical Activity: Sufficiently Active (01/27/2022)   Exercise Vital Sign    Days  of Exercise per Week: 2 days    Minutes of Exercise per Session: 150+ min  Stress: No Stress Concern Present (01/27/2022)   [place]    Feeling of Stress : Not at all  Social Connections: Moderately Integrated (01/27/2022)   Social Connection and Isolation Panel [NHANES]    Frequency of Communication with Friends and Family: More than three times a week  Frequency of Social Gatherings with Friends and Family: More than three times a week    Attends Religious Services: More than 4 times per year    Active Member of [employer] or Organizations: No    Attends Archivist Meetings: Never    Marital Status: Married  Human resources officer Violence: Not At Risk (01/27/2022)   Humiliation, Afraid, Rape, and Kick questionnaire    Fear of Current or Ex-Partner: No    Emotionally Abused: No    Physically Abused: No    Sexually Abused: No     ROS- All systems are reviewed and negative except as per the HPI above.  Physical Exam: Vitals:   04/21/22 1519  BP: (!) 166/72  Pulse: (!) 57  Weight: 88 kg  Height: 5' 9"  (1.753 m)    GEN- The patient is a well appearing male, alert and oriented x 3 today.   Head- normocephalic, atraumatic Eyes-  Sclera clear, conjunctiva pink Ears- hearing intact Oropharynx- clear Neck- supple  Lungs- Clear to ausculation bilaterally, normal work of breathing Heart- Regular rate and rhythm, no murmurs, rubs or gallops  GI- soft, NT, ND, + BS Extremities- no clubbing, cyanosis, or edema MS- no significant deformity or atrophy Skin- no rash or lesion Psych- euthymic mood, full affect Neuro- strength and sensation are intact  Wt Readings from Last 3 Encounters:  04/21/22 88 kg  04/16/22 83.9 kg  03/24/22 87.1 kg    EKG today demonstrates  SB Vent. rate 57 BPM PR interval 180 ms QRS duration 86 ms QT/QTcB 398/387 ms   Epic records are reviewed at length today  CHA2DS2-VASc Score  = 3  The patient's score is based upon: CHF History: 0 HTN History: 1 Diabetes History: 0 Stroke History: 0 Vascular Disease History: 1 Age Score: 1 Gender Score: 0       ASSESSMENT AND PLAN: 1. Paroxysmal Atrial Fibrillation (ICD10:  I48.0) The patient's CHA2DS2-VASc score is 3, indicating a 3.2% annual risk of stroke.   General education about afib provided and questions answered. We also discussed his stroke risk and the risks and benefits of anticoagulation. Check echocardiogram Resume Eliquis 5 mg BID on 6/16 after his epidural injection.  Continue diltiazem 30 mg PRN q 4 hours for heart racing.   2. Secondary Hypercoagulable State (ICD10:  D68.69) The patient is at significant risk for stroke/thromboembolism based upon his CHA2DS2-VASc Score of 3.  Continue Apixaban (Eliquis).   3. Snoring/witnessed apnea  The importance of adequate treatment of sleep apnea was discussed today in order to improve our ability to maintain sinus rhythm long term. Will refer for sleep study.   4. HTN Elevated today, patient resumed lisinopril. Patient to keep BP log for review.   5. CAD CAC score 247 (64th percentile) No anginal symptoms. On statin   Follow up in the AF clinic in one month.    [facility] [address] [place], [place] [address] [phone] 04/21/2022 3:43 PM

## 2022-04-26 DIAGNOSIS — H353122 Nonexudative age-related macular degeneration, left eye, intermediate dry stage: Secondary | ICD-10-CM | POA: Diagnosis not present

## 2022-04-26 DIAGNOSIS — H353211 Exudative age-related macular degeneration, right eye, with active choroidal neovascularization: Secondary | ICD-10-CM | POA: Diagnosis not present

## 2022-04-28 ENCOUNTER — Other Ambulatory Visit: Payer: Self-pay | Admitting: Family Medicine

## 2022-04-28 ENCOUNTER — Ambulatory Visit: Payer: Self-pay

## 2022-04-28 ENCOUNTER — Encounter: Payer: Self-pay | Admitting: Physical Medicine and Rehabilitation

## 2022-04-28 ENCOUNTER — Ambulatory Visit: Payer: PPO | Admitting: Physical Medicine and Rehabilitation

## 2022-04-28 VITALS — BP 131/83 | HR 74

## 2022-04-28 DIAGNOSIS — M47812 Spondylosis without myelopathy or radiculopathy, cervical region: Secondary | ICD-10-CM

## 2022-04-28 MED ORDER — METHYLPREDNISOLONE ACETATE 80 MG/ML IJ SUSP
80.0000 mg | Freq: Once | INTRAMUSCULAR | Status: AC
  Administered 2022-04-28: 80 mg

## 2022-04-28 NOTE — Patient Instructions (Signed)

## 2022-04-28 NOTE — Telephone Encounter (Signed)
LAST APPOINTMENT DATE: 03/09/2022  NEXT APPOINTMENT DATE: 02/09/2022  LAST REFILL: done by historical provider

## 2022-04-28 NOTE — Progress Notes (Signed)
Edward Goodwin - 71 y.o. male MRN [id number]  Date of birth: 1951-04-02  Office Visit Note: Visit Date: 04/28/2022 PCP: [name], MD Referred by: [name], MD  Subjective: Chief Complaint  Patient presents with   Neck - Pain   Right Shoulder - Pain   HPI:  Edward Goodwin is a 71 y.o. male who comes in today at the request of Barnet Pall, FNP for planned Right  C2-3 Cervical facet/medial branch block with fluoroscopic guidance.  The patient has failed conservative care including home exercise, medications, time and activity modification.  This injection will be diagnostic and hopefully therapeutic.  Please see requesting physician notes for further details and justification.  Exam has shown concordant pain with facet joint loading. MRI reviewed with images and spine model.  MRI reviewed in the note below.    ROS Otherwise per HPI.  Assessment & Plan: Visit Diagnoses:    ICD-10-CM   1. Cervical spondylosis without myelopathy  M47.812 XR C-ARM NO REPORT    Facet Injection    methylPREDNISolone acetate (DEPO-MEDROL) injection 80 mg      Plan: No additional findings.   Meds & Orders:  Meds ordered this encounter  Medications   methylPREDNISolone acetate (DEPO-MEDROL) injection 80 mg    Orders Placed This Encounter  Procedures   Facet Injection   XR C-ARM NO REPORT    Follow-up: Return if symptoms worsen or fail to improve.   Procedures: No procedures performed  Diagnostic Cervical Facet Joint Nerve Block with Fluoroscopic Guidance  Patient: Edward Goodwin      Date of Birth: September 06, 1951 MRN: [id number] PCP: [name], MD      Visit Date: 04/28/2022   Universal Protocol:    Date/Time: 06/15/239:42 AM  Consent Given By: the patient  Position: LATERAL  Additional Comments: Vital signs were monitored before and after the procedure. Patient was prepped and draped in the usual sterile fashion. The correct patient, procedure, and site was  verified.   Injection Procedure Details:   Procedure diagnoses: Cervical spondylosis without myelopathy [M47.812]   Meds Administered:  Meds ordered this encounter  Medications   methylPREDNISolone acetate (DEPO-MEDROL) injection 80 mg     Laterality: Right  Location/Site:  C2-3  Needle size: 25 G  Needle type: spinal needle  Needle Placement: Articular Pillar  Findings:  -Contrast Used: 0.5 mL iohexol 180 mg iodine/mL   -Comments: Excellent flow of contrast across the articular pillars without intravascular flow  Procedure Details: The fluoroscope beam was manipulated to achieve the best "true" lateral view possible by squaring off the endplates with cranial and caudal tilt and using varying obliquity to achieve the a view with the longest length of spinous process.  The region overlying the facet joints mentioned above were then localized under fluoroscopic visualization.  The needle was inserted down to the center of the "trapezoid" outline of the facet joint lateral mass. Bi-planar images were used for confirming placement and spot radiographs were documented.  A 0.25 ml volume of Omnipaque-240 was injected to look for vascular uptake. A 0.5 ml. volume of the anesthetic solution was injected onto the target. This procedure was repeated for each medial branch nerve injected.  Prior to the procedure, the patient was given a Pain Diary which was completed for baseline measurements.  After the procedure, the patient rated their pain every 30 minutes and will continue rating at this frequency for a total of 5 hours.  The patient has  been asked to complete the Diary and return to [place] by mail, fax or hand delivered as soon as possible.   Additional Comments:  The patient tolerated the procedure well Dressing: Band-Aid    Post-procedure details: Patient was observed during the procedure. Post-procedure instructions were reviewed.  Patient left the clinic in stable  condition.      Clinical History: EXAM: MRI CERVICAL SPINE WITHOUT CONTRAST   TECHNIQUE: Multiplanar, multisequence MR imaging of the cervical spine was performed. No intravenous contrast was administered.   COMPARISON:  None Available.   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 Right C2-3 facet edema. No fracture or acute abnormality.   Cord: Normal signal and morphology.   Posterior Fossa, vertebral arteries, paraspinal tissues: Negative.   Disc levels:   C1-2: Unremarkable.   C2-3: Right facet arthrosis and minimal disc bulge. There is no spinal canal stenosis. No neural foraminal stenosis.   C3-4: Partial congenital fusion across the disc space. There is no spinal canal stenosis. No neural foraminal stenosis.   C4-5: Small disc bulge with minimal uncovertebral spurring. Mild right facet hypertrophy. There is no spinal canal stenosis. No neural foraminal stenosis.   C5-6: Disc space narrowing with small bulge and uncovertebral spurring, left-greater-than-right. There is no spinal canal stenosis. Mild left neural foraminal stenosis.   C6-7: Small disc bulge with uncovertebral spurring. There is no spinal canal stenosis. No neural foraminal stenosis.   C7-T1: Mild disc degeneration. There is no spinal canal stenosis. No neural foraminal stenosis.   IMPRESSION: 1. Right C2-3 facet edema may be a source of local neck pain. 2. Mild left C5-6 neural foraminal stenosis secondary to combination of disc bulge and uncovertebral spurring. 3. No spinal canal stenosis. 4. 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     Electronically Signed   By: Ulyses Jarred M.D.   On: 03/21/2022 14:22     Objective:  VS:  HT:    WT:   BMI:     BP:131/83  HR:74bpm  TEMP: ( )  RESP:  Physical Exam Vitals and nursing note reviewed.  Constitutional:      General: He is not in acute distress.    Appearance: Normal  appearance. He is not ill-appearing.  HENT:     Head: Normocephalic and atraumatic.     Right Ear: External ear normal.     Left Ear: External ear normal.  Eyes:     Extraocular Movements: Extraocular movements intact.  Cardiovascular:     Rate and Rhythm: Normal rate.     Pulses: Normal pulses.  Abdominal:     General: There is no distension.     Palpations: Abdomen is soft.  Musculoskeletal:        General: No signs of injury.     Cervical back: Neck supple. Tenderness present. No rigidity.     Right lower leg: No edema.     Left lower leg: No edema.     Comments: Patient has good strength in the upper extremities with 5 out of 5 strength in wrist extension long finger flexion APB.  No intrinsic hand muscle atrophy.  Negative Hoffmann's test.  Lymphadenopathy:     Cervical: No cervical adenopathy.  Skin:    Findings: No erythema or rash.  Neurological:     General: No focal deficit present.     Mental Status: He is alert and oriented to person, place, and time.     Sensory: No sensory  deficit.     Motor: No weakness or abnormal muscle tone.     Coordination: Coordination normal.  Psychiatric:        Mood and Affect: Mood normal.        Behavior: Behavior normal.      Imaging: No results found.

## 2022-04-28 NOTE — Progress Notes (Signed)
Pt state neck pain that travels to his right shoulder. Pt state any movement with his head or neck makes the pain worse. Pt state he takes over the counter pain meds and uses heat and ice to help ease his pain.  Numeric Pain Rating Scale and Functional Assessment Average Pain 7   In the last MONTH (on 0-10 scale) has pain interfered with the following?  1. General activity like being  able to carry out your everyday physical activities such as walking, climbing stairs, carrying groceries, or moving a chair?  Rating(10)   +Driver, -BT, -Dye Allergies.

## 2022-04-28 NOTE — Procedures (Signed)
Diagnostic Cervical Facet Joint Nerve Block with Fluoroscopic Guidance  Patient: Edward Goodwin      Date of Birth: [birth year] MRN: [id number] PCP: [name], MD      Visit Date: 04/28/2022   Universal Protocol:    Date/Time: 06/15/239:42 AM  Consent Given By: the patient  Position: LATERAL  Additional Comments: Vital signs were monitored before and after the procedure. Patient was prepped and draped in the usual sterile fashion. The correct patient, procedure, and site was verified.   Injection Procedure Details:   Procedure diagnoses: Cervical spondylosis without myelopathy [M47.812]   Meds Administered:  Meds ordered this encounter  Medications   methylPREDNISolone acetate (DEPO-MEDROL) injection 80 mg     Laterality: Right  Location/Site:  C2-3  Needle size: 25 G  Needle type: spinal needle  Needle Placement: Articular Pillar  Findings:  -Contrast Used: 0.5 mL iohexol 180 mg iodine/mL   -Comments: Excellent flow of contrast across the articular pillars without intravascular flow  Procedure Details: The fluoroscope beam was manipulated to achieve the best "true" lateral view possible by squaring off the endplates with cranial and caudal tilt and using varying obliquity to achieve the a view with the longest length of spinous process.  The region overlying the facet joints mentioned above were then localized under fluoroscopic visualization.  The needle was inserted down to the center of the "trapezoid" outline of the facet joint lateral mass. Bi-planar images were used for confirming placement and spot radiographs were documented.  A 0.25 ml volume of Omnipaque-240 was injected to look for vascular uptake. A 0.5 ml. volume of the anesthetic solution was injected onto the target. This procedure was repeated for each medial branch nerve injected.  Prior to the procedure, the patient was given a Pain Diary which was completed for baseline measurements.  After  the procedure, the patient rated their pain every 30 minutes and will continue rating at this frequency for a total of 5 hours.  The patient has been asked to complete the Diary and return to [place] by mail, fax or hand delivered as soon as possible.   Additional Comments:  The patient tolerated the procedure well Dressing: Band-Aid    Post-procedure details: Patient was observed during the procedure. Post-procedure instructions were reviewed.  Patient left the clinic in stable condition.

## 2022-04-29 ENCOUNTER — Ambulatory Visit (INDEPENDENT_AMBULATORY_CARE_PROVIDER_SITE_OTHER): Payer: PPO | Admitting: Family Medicine

## 2022-04-29 ENCOUNTER — Encounter: Payer: Self-pay | Admitting: Family Medicine

## 2022-04-29 VITALS — BP 118/76 | HR 58 | Temp 98.7°F | Ht 69.0 in | Wt 190.4 lb

## 2022-04-29 DIAGNOSIS — D6869 Other thrombophilia: Secondary | ICD-10-CM

## 2022-04-29 DIAGNOSIS — I10 Essential (primary) hypertension: Secondary | ICD-10-CM

## 2022-04-29 DIAGNOSIS — E785 Hyperlipidemia, unspecified: Secondary | ICD-10-CM

## 2022-04-29 DIAGNOSIS — I480 Paroxysmal atrial fibrillation: Secondary | ICD-10-CM

## 2022-04-29 NOTE — Patient Instructions (Addendum)
Great job picking the a fib up and getting checked out!  I would start the eliquis 5 mg twice daily - avoid ibuprofen/aleve/motrin- can take tylenol or tylenol arthritis -can do topical voltaren/diclofenac if needed  Recommended follow up: Return for next already scheduled visit or sooner if needed.

## 2022-04-29 NOTE — Progress Notes (Signed)
Phone [phone] In person visit   Subjective:   Edward Goodwin is a 71 y.o. year old very pleasant male patient who presents for/with See problem oriented charting Chief Complaint  Patient presents with   Hospitalization Follow-up    Pt was in the ER on 04/16/2022 and was dx with A-fib. He stated that overall now he feels fine    Past Medical History-  Patient Active Problem List   Diagnosis Date Noted   Paroxysmal atrial fibrillation ([place]) 04/21/2022    Priority: High   Secondary hypercoagulable state ([place]) 04/21/2022    Priority: High   nonobstructive CAD (coronary artery disease)- CAC 247 on 05/03/2018  12/16/2021    Priority: High   Abdominal aortic aneurysm (AAA) without rupture, unspecified part ([place])     Priority: High   Ulcerative colitis ([place]) 08/20/2007    Priority: High   Former smoker 04/19/2018    Priority: Medium    Hyperlipidemia 04/19/2018    Priority: Medium    Vertigo 12/19/2017    Priority: Medium    Gout 03/24/2014    Priority: Medium    Elevated PSA, less than 10 ng/ml 09/09/2013    Priority: Medium    Macular degeneration, bilateral 07/15/2012    Priority: Medium    Obesity (BMI 30.0-34.9) 12/02/2008    Priority: Medium    Essential hypertension 08/20/2007    Priority: Medium    PSORIASIS 08/20/2007    Priority: Medium    Onychomycosis 12/19/2017    Priority: Low   Trochanteric bursitis of left hip 03/31/2015    Priority: Low    Medications- reviewed and updated Current Outpatient Medications  Medication Sig Dispense Refill   apixaban (ELIQUIS) 5 MG TABS tablet Take 1 tablet (5 mg total) by mouth 2 (two) times daily. 60 tablet 3   Ascorbic Acid (VITAMIN C PO) Take 1 tablet by mouth daily. 1000 mg     atorvastatin (LIPITOR) 20 MG tablet TAKE 1 TABLET BY MOUTH EVERY DAY 90 tablet 3   clobetasol ointment (TEMOVATE) 0.05 % Apply topically 2 (two) times daily as needed.     diltiazem (CARDIZEM) 30 MG tablet Take 1 tablet (30 mg total) by  mouth daily as needed. 30 tablet 1   L-Lysine 1000 MG TABS Take 500 mg by mouth daily.      lisinopril (ZESTRIL) 20 MG tablet TAKE 1 TABLET BY MOUTH EVERY DAY 90 tablet 3   meclizine (ANTIVERT) 25 MG tablet Take 1 tablet (25 mg total) by mouth every 6 (six) hours as needed for dizziness. 60 tablet 2   Multiple Vitamins-Minerals (PRESERVISION AREDS 2) CAPS Take 1 capsule by mouth 2 (two) times daily before lunch and supper.     Propylene Glycol (SYSTANE COMPLETE OP) Apply 1 drop to eye 2 (two) times daily.     cyclobenzaprine (FLEXERIL) 5 MG tablet Take 1 tablet (5 mg total) by mouth at bedtime as needed for muscle spasms. (Patient not taking: Reported on 04/29/2022) 15 tablet 0   No current facility-administered medications for this visit.     Objective:  BP 118/76   Pulse (!) 58   Temp 98.7 F (37.1 C)   Ht 5' 9"  (1.753 m)   Wt 190 lb 6.4 oz (86.4 kg)   SpO2 97%   BMI 28.12 kg/m  Gen: NAD, resting comfortably CV: RRR no murmurs rubs or gallops Lungs: CTAB no crackles, wheeze, rhonchi Abdomen: soft/nontender/nondistended/normal bowel sounds.  Ext: no edema Skin: warm, dry  Assessment and Plan    # Atrial fibrillation #Secondary hypercoagulable state S: Rate controlled with diltiazem 30 mg only as needed for elevated heart rate/palpitations/heart racing -new diagnosis 04/16/22 in ER. He felt more tired, HR was higher, blood pressure was loewr.  -Eye injection was on Tuesday this week and neck injection was yesterday so he held eliquis at least until afterwards Anticoagulated plan with Eliquis 5 mg twice daily- as above hasnt started yest  Chadsvasc score of 3 Patient is  followed by cardiology: Atrial fibrillation clinic -upcoming echo next week -they are also going to evaluate for OSA -knows to avoid nsaids orally- can use topical nsaids A/P: new diagnosis a fib. I think as needed diltiazem is reasonable for elevated HR and also he agrees to start eliquis for  anticoagulation after discussion today   #hypertension S: medication: Lisinopril 20 mg once daily, diltiazem as needed for elevated heart rate. Held lisinopril short term when in a fib as BP running lower BP Readings from Last 3 Encounters:  04/29/22 118/76  04/28/22 131/83  04/21/22 (!) 166/72  A/P: Controlled. Continue current medications.   #hyperlipidemia with coronary artery calcium 247 in 05/03/2018 64% for age S: Medication:Atorvastatin 20 mg once daily Lab Results  Component Value Date   CHOL 103 12/16/2021   HDL 47.80 12/16/2021   LDLCALC 41 12/16/2021   TRIG 72.0 12/16/2021   CHOLHDL 2 12/16/2021  A/P: reasonable control on last check- continue current meds  #hopeful neck injection will be helpful  Recommended follow up: Return for next already scheduled visit or sooner if needed. Future Appointments  Date Time Provider [place]  05/02/2022  9:00 AM [facility] ECHO OP 1 [facility]  05/20/2022  9:00 AM Fenton, Benjamin R, PA [facility] None  06/15/2022  9:00 AM Lianni Kanaan, Brayton Mars, MD [facility]   Lab/Order associations:   ICD-10-CM   1. Paroxysmal atrial fibrillation (HCC)  I48.0     2. Secondary hypercoagulable state ([place])  D68.69     3. Essential hypertension  I10     4. Hyperlipidemia, unspecified hyperlipidemia type  E78.5       No orders of the defined types were placed in this encounter.   Return precautions advised.  Garret Reddish, MD

## 2022-05-02 ENCOUNTER — Ambulatory Visit (HOSPITAL_COMMUNITY)
Admission: RE | Admit: 2022-05-02 | Discharge: 2022-05-02 | Disposition: A | Discharge status: Home / Self Care | Payer: PPO | Source: Ambulatory Visit | Attending: Physician Assistant | Admitting: Physician Assistant

## 2022-05-02 DIAGNOSIS — I480 Paroxysmal atrial fibrillation: Secondary | ICD-10-CM | POA: Insufficient documentation

## 2022-05-02 DIAGNOSIS — I10 Essential (primary) hypertension: Secondary | ICD-10-CM | POA: Diagnosis not present

## 2022-05-02 LAB — ECHOCARDIOGRAM COMPLETE
Area-P 1/2: 3.51 cm2
Calc EF: 57.3 %
S' Lateral: 3.6 cm
Single Plane A2C EF: 58.1 %
Single Plane A4C EF: 55.1 %

## 2022-05-02 IMAGING — DX DG CHEST 2V
2 series · 3 of 3 positions shown · non-contrast
Comparison: September 11, 2009

CLINICAL DATA: Left upper chest pain with deep breath.

EXAM:
CHEST - 2 VIEW

[Series 1: chest pa · 0.14mm/px · 2 of 2 slices shown]
[im 1/2]
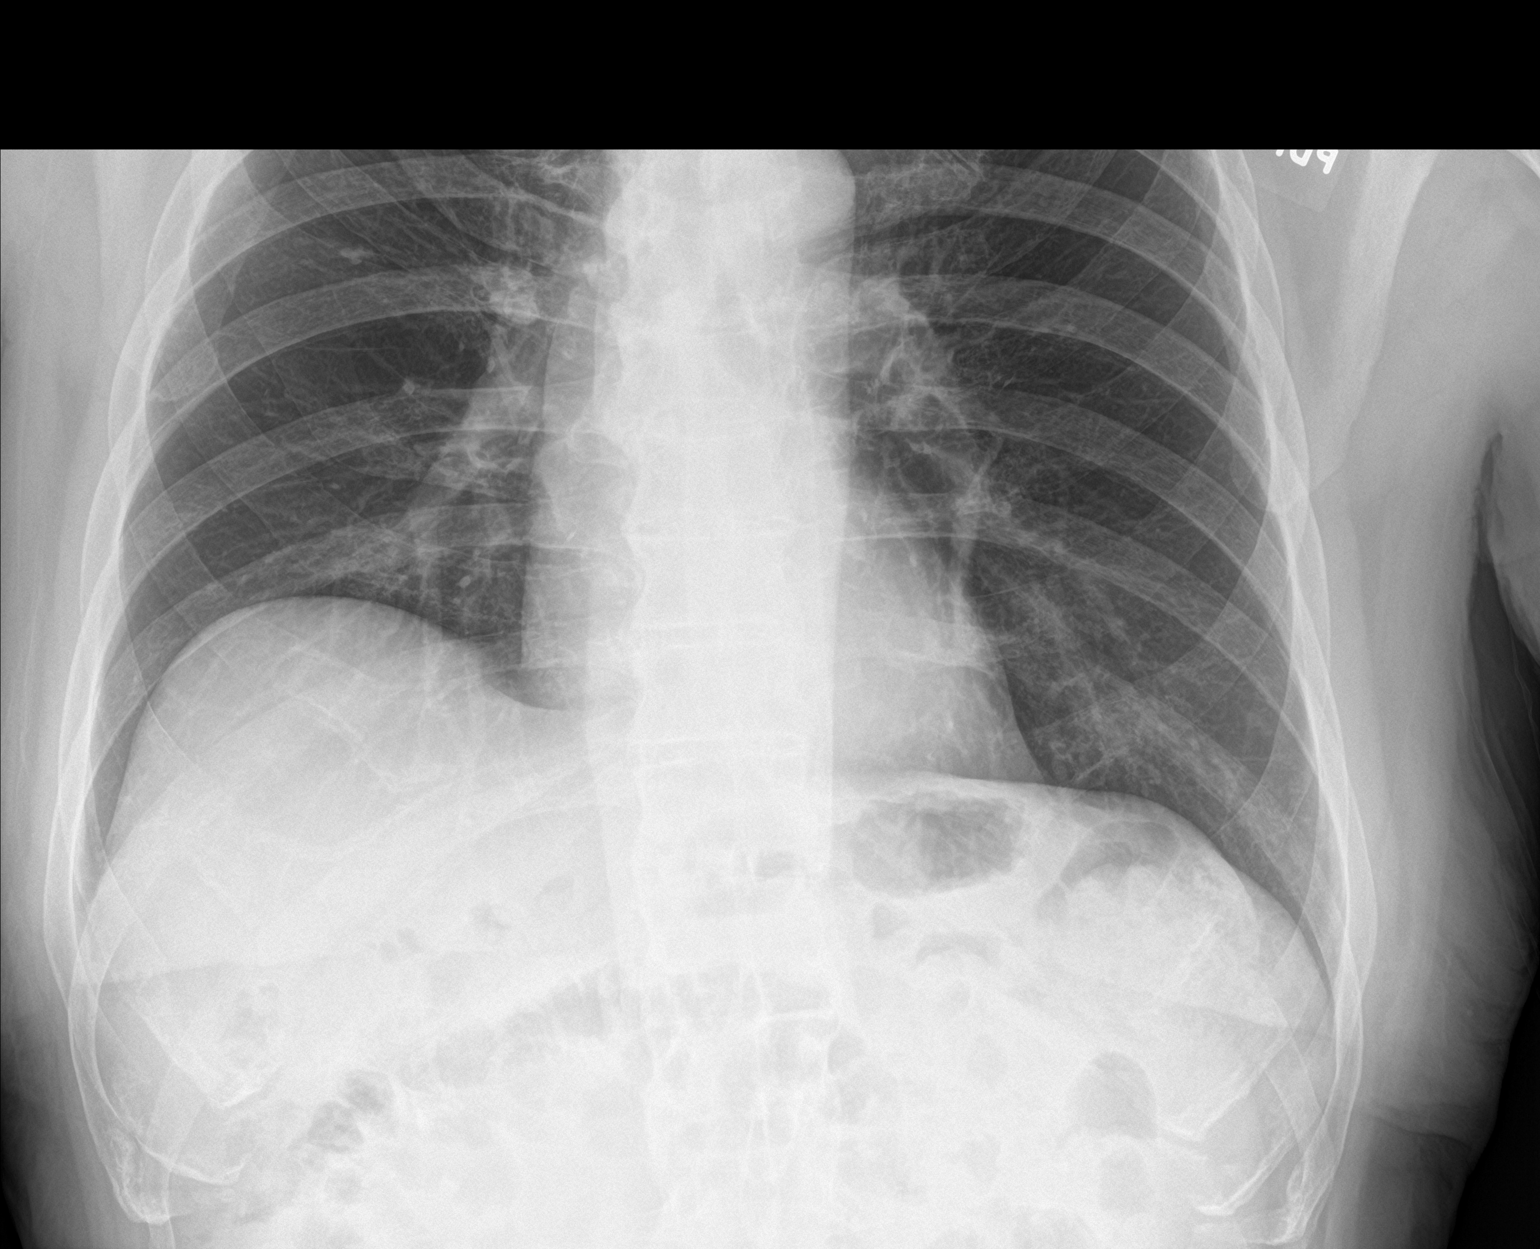
[im 2/2]
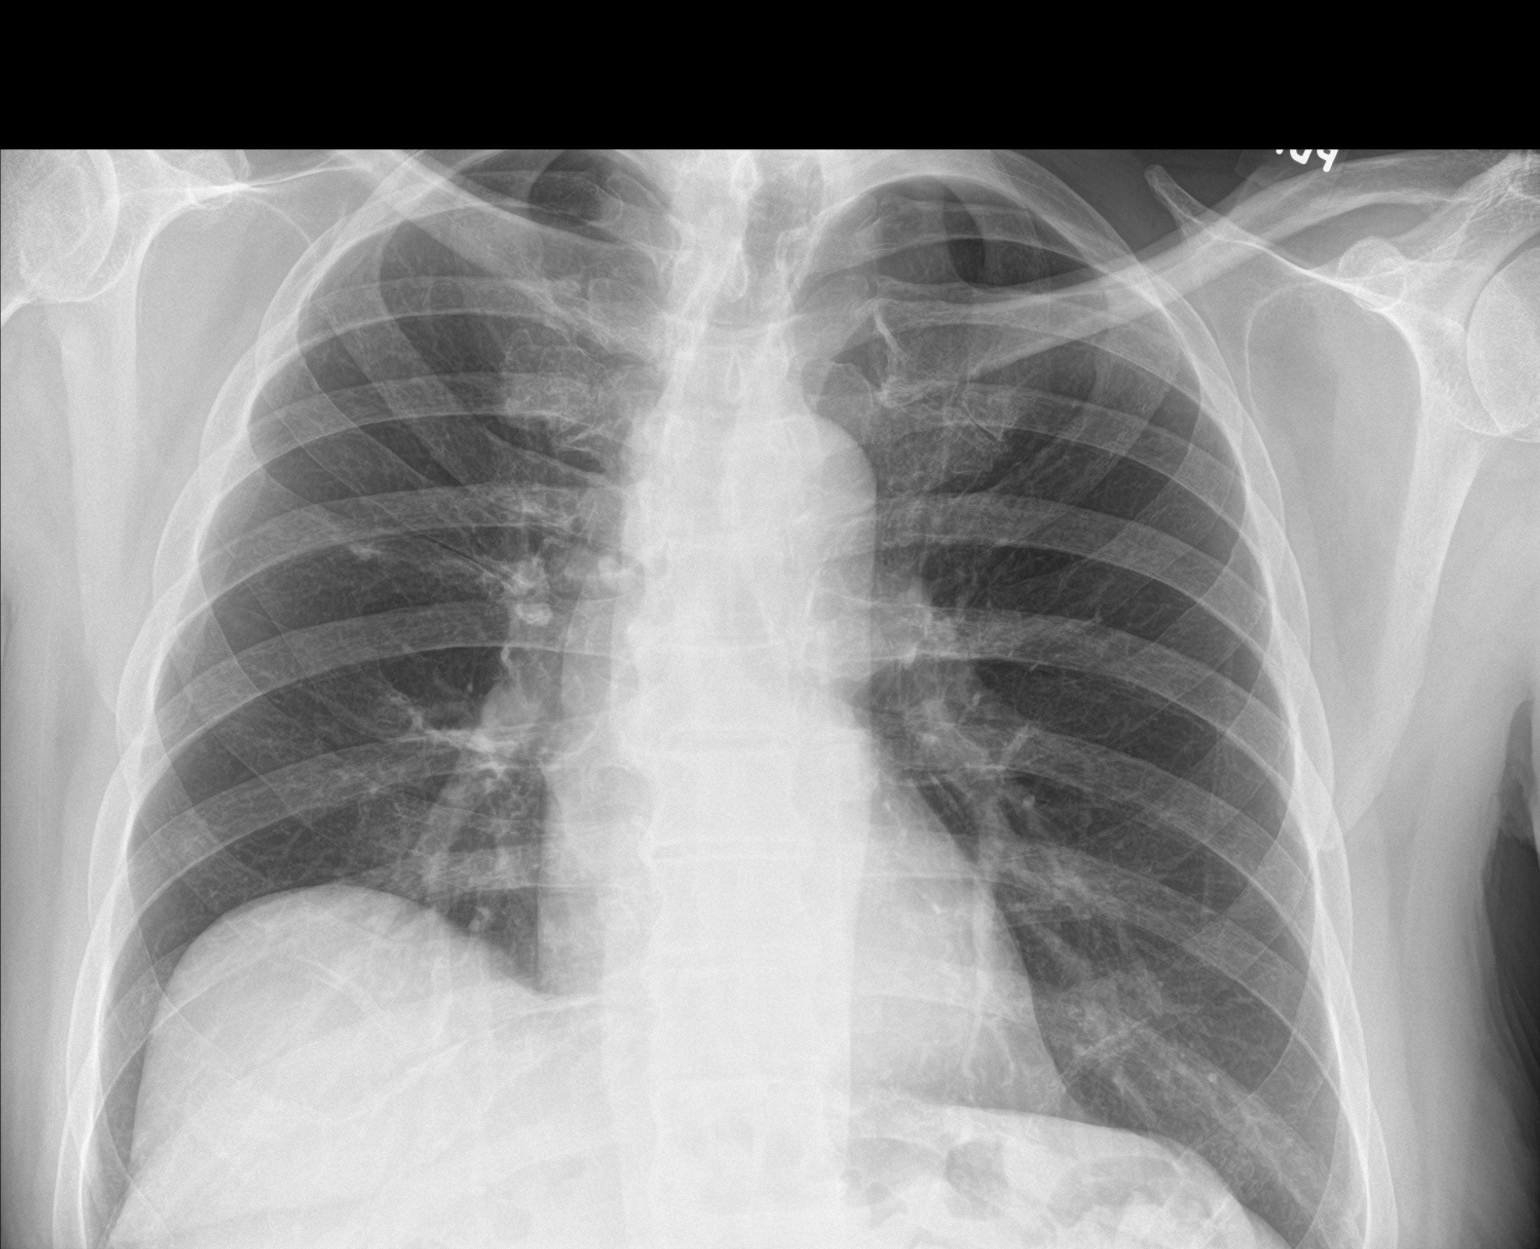

[chest lat]
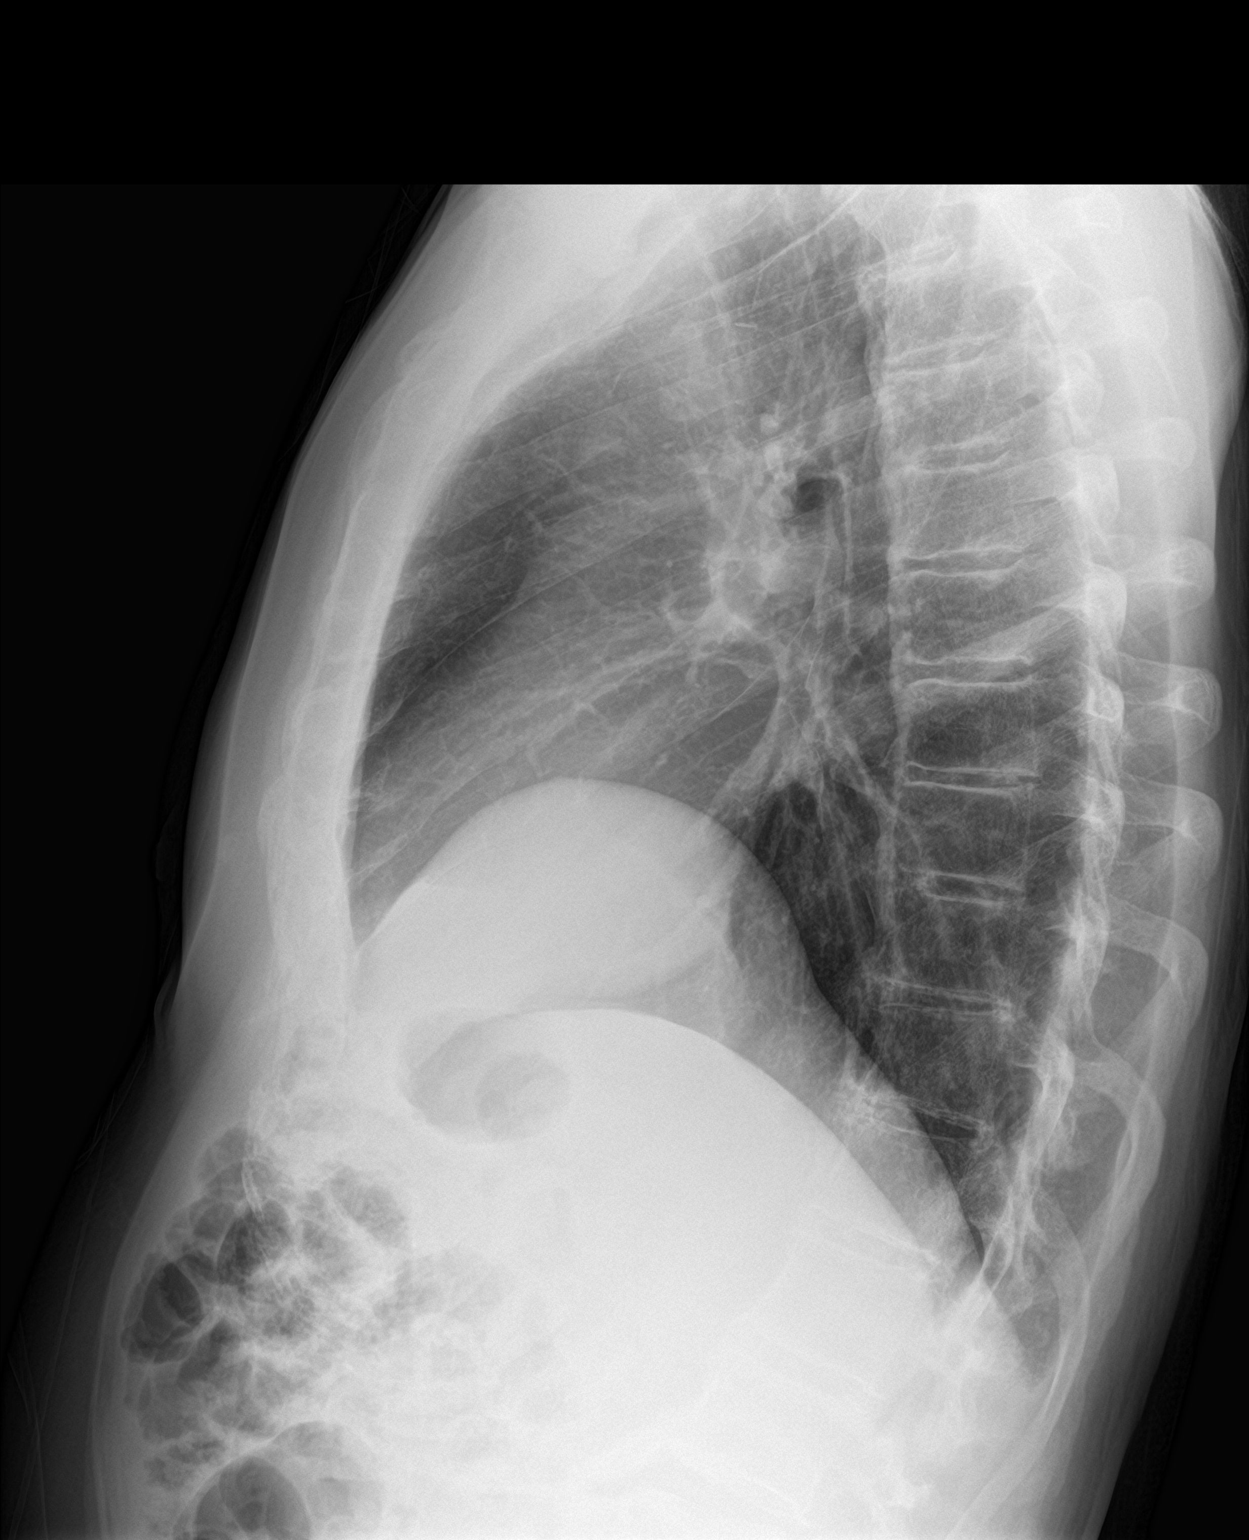

[3 of 3 positions shown; findings below may reference images not displayed]

FINDINGS: A small nodule in the mid right lung is unchanged since 9848,
benign. No other nodules. No focal infiltrates. The
cardiomediastinal silhouette is stable. No other acute abnormalities
or changes. No cause for left chest pain identified.
IMPRESSION: No active cardiopulmonary disease.

## 2022-05-02 NOTE — Progress Notes (Signed)
  Echocardiogram 2D Echocardiogram has been performed.  Edward Goodwin 05/02/2022, 9:42 AM

## 2022-05-12 ENCOUNTER — Ambulatory Visit: Payer: PPO | Admitting: Family Medicine

## 2022-05-20 ENCOUNTER — Ambulatory Visit (HOSPITAL_COMMUNITY)
Admission: RE | Admit: 2022-05-20 | Discharge: 2022-05-20 | Disposition: A | Discharge status: Home / Self Care | Payer: PPO | Source: Ambulatory Visit | Attending: Physician Assistant | Admitting: Physician Assistant

## 2022-05-20 VITALS — BP 116/60 | HR 52 | Ht 69.0 in | Wt 191.4 lb

## 2022-05-20 DIAGNOSIS — R5383 Other fatigue: Secondary | ICD-10-CM | POA: Diagnosis not present

## 2022-05-20 DIAGNOSIS — D6869 Other thrombophilia: Secondary | ICD-10-CM | POA: Diagnosis not present

## 2022-05-20 DIAGNOSIS — I10 Essential (primary) hypertension: Secondary | ICD-10-CM | POA: Diagnosis not present

## 2022-05-20 DIAGNOSIS — I480 Paroxysmal atrial fibrillation: Secondary | ICD-10-CM | POA: Insufficient documentation

## 2022-05-20 DIAGNOSIS — E785 Hyperlipidemia, unspecified: Secondary | ICD-10-CM | POA: Diagnosis not present

## 2022-05-20 DIAGNOSIS — I2510 Atherosclerotic heart disease of native coronary artery without angina pectoris: Secondary | ICD-10-CM | POA: Diagnosis not present

## 2022-05-20 DIAGNOSIS — Z7901 Long term (current) use of anticoagulants: Secondary | ICD-10-CM | POA: Diagnosis not present

## 2022-05-20 DIAGNOSIS — Z79899 Other long term (current) drug therapy: Secondary | ICD-10-CM | POA: Diagnosis not present

## 2022-05-20 LAB — CBC
HCT: 39.4 % (ref 39.0–52.0)
Hemoglobin: 12.5 g/dL — ABNORMAL LOW (ref 13.0–17.0)
MCH: 27.7 pg (ref 26.0–34.0)
MCHC: 31.7 g/dL (ref 30.0–36.0)
MCV: 87.2 fL (ref 80.0–100.0)
Platelets: 129 10*3/uL — ABNORMAL LOW (ref 150–400)
RBC: 4.52 MIL/uL (ref 4.22–5.81)
RDW: 14.2 % (ref 11.5–15.5)
WBC: 5.2 10*3/uL (ref 4.0–10.5)
nRBC: 0 % (ref 0.0–0.2)

## 2022-05-20 NOTE — Progress Notes (Signed)
Primary Care Physician: [name], MD Primary Cardiologist: none Primary Electrophysiologist: none Referring Physician: [facility]   Edward Goodwin is a 71 y.o. male with a history of HTN, HLD, nonobstructive CAD, atrial fibrillation who presents for consultation in the [facility].  The patient was initially diagnosed with atrial fibrillation 04/16/22 after presenting to the ED with symptoms of weakness, "skipped beats", and low BP. ECG showed rate controlled afib. His lisinopril was held and he was started on diltiazem PRN for heart racing. Patient was also started on Eliquis for a CHADS2VASC score of 3. He felt he was back in SR after leaving the ED and has not had any further symptoms. He stopped Eliquis because he was concerned that he would not be able to have his macular degeneration injection or his epidural injection. He resumed his lisinopril. He does admit to snoring and witnessed apnea.   On follow up today, patient reports that he has done well since his last visit. No tachypalpitations, no significant bleeding issues on anticoagulation. Echo showed normal LV function, no valvular issues.   Today, he denies symptoms of palpitations, chest pain, shortness of breath, orthopnea, PND, lower extremity edema, dizziness, presyncope, syncope, bleeding, or neurologic sequela. The patient is tolerating medications without difficulties and is otherwise without complaint today.    Atrial Fibrillation Risk Factors:  he does have symptoms or diagnosis of sleep apnea. he is agreeable to sleep study.  he does not have a history of rheumatic fever.   he has a BMI of Body mass index is 28.26 kg/m.Mar[place] Kitchen Filed Weights   05/20/22 0902  Weight: 86.8 kg     Family History  Problem Relation Age of Onset   Heart disease Mother    Breast cancer Mother    Hypertension Mother    Stroke Mother    Hypothyroidism Mother    Dementia Mother    Heart disease  Father        CAD/MI 37s or 7s smoker. led to death age 2.    Hypertension Father    Diabetes Daughter    Diabetes Maternal Uncle    Hepatitis C Brother    Stroke Maternal Grandmother    Emphysema Maternal Grandfather    Arthritis Paternal Grandmother    Colon cancer Neg Hx    Esophageal cancer Neg Hx    Stomach cancer Neg Hx    Rectal cancer Neg Hx      Atrial Fibrillation Management history:  Previous antiarrhythmic drugs: none Previous cardioversions: none Previous ablations: none CHADS2VASC score: 3 Anticoagulation history: Eliquis   Past Medical History:  Diagnosis Date   Chicken pox    Colitis, ulcerative ([place])    Hypertension    Macular degeneration, bilateral 07/2012   areds 2    Psoriasis    Past Surgical History:  Procedure Laterality Date   ACNE CYST REMOVAL     CATARACT EXTRACTION W/ INTRAOCULAR LENS IMPLANT  10/2014   bilat   COLONOSCOPY     FOOT SURGERY  1988   SHOULDER ARTHROSCOPY  2007   Right   TONSILLECTOMY      Current Outpatient Medications  Medication Sig Dispense Refill   Acetaminophen (TYLENOL PO) Take 1 tablet by mouth as needed. Taking Tylenol Arthritis     apixaban (ELIQUIS) 5 MG TABS tablet Take 1 tablet (5 mg total) by mouth 2 (two) times daily. 60 tablet 3   Ascorbic Acid (VITAMIN C PO) Take 1 tablet  by mouth daily. 1000 mg     atorvastatin (LIPITOR) 20 MG tablet TAKE 1 TABLET BY MOUTH EVERY DAY 90 tablet 3   clobetasol ointment (TEMOVATE) 0.05 % Apply topically 2 (two) times daily as needed.     diltiazem (CARDIZEM) 30 MG tablet Take 1 tablet (30 mg total) by mouth daily as needed. 30 tablet 1   diphenhydramine-acetaminophen (TYLENOL PM) 25-500 MG TABS tablet Take 1 tablet by mouth at bedtime as needed.     Faricimab-svoa (VABYSMO IZ) by Intravitreal route every 30 (thirty) days.     L-Lysine 1000 MG TABS Take 500 mg by mouth daily.      lisinopril (ZESTRIL) 20 MG tablet TAKE 1 TABLET BY MOUTH EVERY DAY 90 tablet 3   meclizine  (ANTIVERT) 25 MG tablet Take 1 tablet (25 mg total) by mouth every 6 (six) hours as needed for dizziness. 60 tablet 2   Multiple Vitamins-Minerals (PRESERVISION AREDS 2) CAPS Take 1 capsule by mouth 2 (two) times daily before lunch and supper.     Propylene Glycol (SYSTANE COMPLETE OP) Apply 1 drop to eye 2 (two) times daily.     No current facility-administered medications for this encounter.    Allergies  Allergen Reactions   Sulfonamide Derivatives     "have just been told that my entire life"    Social History   Socioeconomic History   Marital status: Married    Spouse name: Not on file   Number of children: 1   Years of education: 12   Highest education level: Not on file  Occupational History   Occupation: Herbalist    Comment: retired   Tobacco Use   Smoking status: Former    Types: Cigarettes    Quit date: 10/23/1989    Years since quitting: 32.5   Smokeless tobacco: Never  Vaping Use   Vaping Use: Never used  Substance and Sexual Activity   Alcohol use: Yes    Alcohol/week: 0.0 standard drinks of alcohol    Comment: rare glass of wine - less than once a year   Drug use: No   Sexual activity: Not Currently  Other Topics Concern   Not on file  Social History Narrative   Married 1971. 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 [employer] 6 years.       ACP: HCPOA - wife. Yes for acute CPR, no for prolonged mechanical ventilatory support, no for prolonged artificial nutrition or other heroic measures leaving him in an incapacitated state.    Social Determinants of Health   Financial Resource Strain: Low Risk  (01/27/2022)   Overall Financial Resource Strain (CARDIA)    Difficulty of Paying Living Expenses: Not hard at all  Food Insecurity: No Food Insecurity (01/27/2022)   Hunger Vital Sign    Worried About Running Out of Food in the Last  Year: Never true    Ran Out of Food in the Last Year: Never true  Transportation Needs: No Transportation Needs (01/27/2022)   PRAPARE - Hydrologist (Medical): No    Lack of Transportation (Non-Medical): No  Physical Activity: Sufficiently Active (01/27/2022)   Exercise Vital Sign    Days of Exercise per Week: 2 days    Minutes of Exercise per Session: 150+ min  Stress: No Stress Concern Present (01/27/2022)   [place]  Institute of Occupational Health - Occupational Stress Questionnaire    Feeling of Stress : Not at all  Social Connections: Moderately Integrated (01/27/2022)   Social Connection and Isolation Panel [NHANES]    Frequency of Communication with Friends and Family: More than three times a week    Frequency of Social Gatherings with Friends and Family: More than three times a week    Attends Religious Services: More than 4 times per year    Active Member of [employer] or Organizations: No    Attends Archivist Meetings: Never    Marital Status: Married  Human resources officer Violence: Not At Risk (01/27/2022)   Humiliation, Afraid, Rape, and Kick questionnaire    Fear of Current or Ex-Partner: No    Emotionally Abused: No    Physically Abused: No    Sexually Abused: No     ROS- All systems are reviewed and negative except as per the HPI above.  Physical Exam: Vitals:   05/20/22 0902  BP: 116/60  Pulse: (!) 52  Weight: 86.8 kg  Height: 5' 9"  (1.753 m)     GEN- The patient is a well appearing male, alert and oriented x 3 today.   HEENT-head normocephalic, atraumatic, sclera clear, conjunctiva pink, hearing intact, trachea midline. Lungs- Clear to ausculation bilaterally, normal work of breathing Heart- Regular rate and rhythm, bradycardia, no murmurs, rubs or gallops  GI- soft, NT, ND, + BS Extremities- no clubbing, cyanosis, or edema MS- no significant deformity or atrophy Skin- no rash or lesion Psych- euthymic mood, full  affect Neuro- strength and sensation are intact   Wt Readings from Last 3 Encounters:  05/20/22 86.8 kg  04/29/22 86.4 kg  04/21/22 88 kg    EKG today demonstrates  SB Vent. rate 52 BPM PR interval 148 ms QRS duration 86 ms QT/QTcB 392/364 ms  Echo 05/02/22  1. Left ventricular ejection fraction, by estimation, is 55 to 60%. Left  ventricular ejection fraction by 2D MOD biplane is 57.3 %. The left  ventricle has normal function. The left ventricle has no regional wall  motion abnormalities. Left ventricular diastolic parameters were normal.   2. Right ventricular systolic function is normal. The right ventricular  size is normal.   3. The mitral valve is grossly normal. Trivial mitral valve  regurgitation. No evidence of mitral stenosis.   4. The aortic valve is tricuspid. Aortic valve regurgitation is not  visualized. No aortic stenosis is present.   Epic records are reviewed at length today  CHA2DS2-VASc Score = 3  The patient's score is based upon: CHF History: 0 HTN History: 1 Diabetes History: 0 Stroke History: 0 Vascular Disease History: 1 Age Score: 1 Gender Score: 0       ASSESSMENT AND PLAN: 1. Paroxysmal Atrial Fibrillation (ICD10:  I48.0) The patient's CHA2DS2-VASc score is 3, indicating a 3.2% annual risk of stroke.   Patient appears to be maintaining SR. Continue Eliquis 5 mg BID. Check CBC Continue diltiazem 30 mg PRN q 4 hours for heart racing.   2. Secondary Hypercoagulable State (ICD10:  D68.69) The patient is at significant risk for stroke/thromboembolism based upon his CHA2DS2-VASc Score of 3.  Continue Apixaban (Eliquis).   3. Snoring/witnessed apnea  Referred for sleep study.  4. HTN Stable, no changes today.  5. CAD CAC score 247 (64th percentile) On statin No anginal symptoms.   Will refer to establish care with primary cardiologist.    Adline Peals PA-C [facility] [address] [place], [place]  [address] [phone] 05/20/2022 9:20 AM

## 2022-05-22 ENCOUNTER — Other Ambulatory Visit (HOSPITAL_COMMUNITY): Payer: Self-pay | Admitting: Physician Assistant

## 2022-05-31 ENCOUNTER — Telehealth: Payer: Self-pay | Admitting: Physical Medicine and Rehabilitation

## 2022-05-31 DIAGNOSIS — H353122 Nonexudative age-related macular degeneration, left eye, intermediate dry stage: Secondary | ICD-10-CM | POA: Diagnosis not present

## 2022-05-31 DIAGNOSIS — H353211 Exudative age-related macular degeneration, right eye, with active choroidal neovascularization: Secondary | ICD-10-CM | POA: Diagnosis not present

## 2022-05-31 NOTE — Telephone Encounter (Signed)
Pt called to set an appt. Please call pt at [phone]

## 2022-06-01 ENCOUNTER — Encounter: Payer: Self-pay | Admitting: Physical Medicine and Rehabilitation

## 2022-06-15 ENCOUNTER — Encounter: Payer: Self-pay | Admitting: Family Medicine

## 2022-06-15 ENCOUNTER — Ambulatory Visit (INDEPENDENT_AMBULATORY_CARE_PROVIDER_SITE_OTHER): Payer: PPO | Admitting: Family Medicine

## 2022-06-15 VITALS — BP 110/62 | HR 56 | Temp 98.0°F | Ht 69.0 in | Wt 191.2 lb

## 2022-06-15 DIAGNOSIS — E785 Hyperlipidemia, unspecified: Secondary | ICD-10-CM

## 2022-06-15 DIAGNOSIS — I10 Essential (primary) hypertension: Secondary | ICD-10-CM | POA: Diagnosis not present

## 2022-06-15 DIAGNOSIS — I480 Paroxysmal atrial fibrillation: Secondary | ICD-10-CM

## 2022-06-15 LAB — TSH: TSH: 1.15 u[IU]/mL (ref 0.35–5.50)

## 2022-06-15 LAB — CBC WITH DIFFERENTIAL/PLATELET
Basophils Absolute: 0 10*3/uL (ref 0.0–0.1)
Basophils Relative: 0.7 % (ref 0.0–3.0)
Eosinophils Absolute: 0.1 10*3/uL (ref 0.0–0.7)
Eosinophils Relative: 1.3 % (ref 0.0–5.0)
HCT: 39.2 % (ref 39.0–52.0)
Hemoglobin: 13.1 g/dL (ref 13.0–17.0)
Lymphocytes Relative: 17.1 % (ref 12.0–46.0)
Lymphs Abs: 0.8 10*3/uL (ref 0.7–4.0)
MCHC: 33.5 g/dL (ref 30.0–36.0)
MCV: 85.4 fl (ref 78.0–100.0)
Monocytes Absolute: 0.5 10*3/uL (ref 0.1–1.0)
Monocytes Relative: 10.1 % (ref 3.0–12.0)
Neutro Abs: 3.2 10*3/uL (ref 1.4–7.7)
Neutrophils Relative %: 70.8 % (ref 43.0–77.0)
Platelets: 129 10*3/uL — ABNORMAL LOW (ref 150.0–400.0)
RBC: 4.59 Mil/uL (ref 4.22–5.81)
RDW: 16.4 % — ABNORMAL HIGH (ref 11.5–15.5)
WBC: 4.6 10*3/uL (ref 4.0–10.5)

## 2022-06-15 NOTE — Patient Instructions (Addendum)
Flu shot- we should have these available within a month or two but please let us know if you get at outside pharmacy  Please stop by lab before you go If you have mychart- we will send your results within 3 business days of [place] receiving them.  If you do not have mychart- we will call you about results within 5 business days of [place] receiving them.  *please also note that you will see labs on mychart as soon as they post. I will later go in and write notes on them- will say "notes from Dr. Yong Channel"   Recommended follow up: Return in about 6 months (around 12/16/2022) for physical or sooner if needed.Schedule b4 you leave.

## 2022-06-15 NOTE — Progress Notes (Signed)
Phone [phone] In person visit   Subjective:   Edward Goodwin is a 71 y.o. year old very pleasant male patient who presents for/with See problem oriented charting Chief Complaint  Patient presents with   Follow-up   Hyperlipidemia   Hypertension   Past Medical History-  Patient Active Problem List   Diagnosis Date Noted   Paroxysmal atrial fibrillation ([place]) 04/21/2022    Priority: High   Secondary hypercoagulable state ([place]) 04/21/2022    Priority: High   nonobstructive CAD (coronary artery disease)- CAC 247 on 05/03/2018  12/16/2021    Priority: High   Abdominal aortic aneurysm (AAA) without rupture, unspecified part ([place])     Priority: High   Ulcerative colitis ([place]) 08/20/2007    Priority: High   Former smoker 04/19/2018    Priority: Medium    Hyperlipidemia 04/19/2018    Priority: Medium    Vertigo 12/19/2017    Priority: Medium    Gout 03/24/2014    Priority: Medium    Elevated PSA, less than 10 ng/ml 09/09/2013    Priority: Medium    Macular degeneration, bilateral 07/15/2012    Priority: Medium    Obesity (BMI 30.0-34.9) 12/02/2008    Priority: Medium    Essential hypertension 08/20/2007    Priority: Medium    PSORIASIS 08/20/2007    Priority: Medium    Onychomycosis 12/19/2017    Priority: Low   Trochanteric bursitis of left hip 03/31/2015    Priority: Low    Medications- reviewed and updated Current Outpatient Medications  Medication Sig Dispense Refill   Acetaminophen (TYLENOL PO) Take 1 tablet by mouth as needed. Taking Tylenol Arthritis     Ascorbic Acid (VITAMIN C PO) Take 1 tablet by mouth daily. 1000 mg     atorvastatin (LIPITOR) 20 MG tablet TAKE 1 TABLET BY MOUTH EVERY DAY 90 tablet 3   clobetasol ointment (TEMOVATE) 0.05 % Apply topically 2 (two) times daily as needed.     diltiazem (CARDIZEM) 30 MG tablet Take one tablet by mouth every 4 hrs as needed, Heart rate needs to be above 100, top number blood pressure above 100 45 tablet 1    diphenhydramine-acetaminophen (TYLENOL PM) 25-500 MG TABS tablet Take 1 tablet by mouth at bedtime as needed.     Faricimab-svoa (VABYSMO IZ) by Intravitreal route every 30 (thirty) days.     L-Lysine 1000 MG TABS Take 500 mg by mouth daily.      lisinopril (ZESTRIL) 20 MG tablet TAKE 1 TABLET BY MOUTH EVERY DAY 90 tablet 3   meclizine (ANTIVERT) 25 MG tablet Take 1 tablet (25 mg total) by mouth every 6 (six) hours as needed for dizziness. 60 tablet 2   Multiple Vitamins-Minerals (PRESERVISION AREDS 2) CAPS Take 1 capsule by mouth 2 (two) times daily before lunch and supper.     Propylene Glycol (SYSTANE COMPLETE OP) Apply 1 drop to eye 2 (two) times daily.     apixaban (ELIQUIS) 5 MG TABS tablet Take 1 tablet (5 mg total) by mouth 2 (two) times daily. 60 tablet 3   No current facility-administered medications for this visit.     Objective:  BP 110/62   Pulse (!) 56   Temp 98 F (36.7 C)   Ht 5' 9"  (1.753 m)   Wt 191 lb 3.2 oz (86.7 kg)   SpO2 98%   BMI 28.24 kg/m  Gen: NAD, resting comfortably CV: mildly bradycardic- does sound regular Lungs: CTAB no crackles, wheeze, rhonchi Ext: no edema  Skin: warm, dry    Assessment and Plan   # Atrial fibrillation #Secondary hypercoagulable state S: Rate controlled with diltiazem 30 mg only as needed for elevated heart rate/palpitations/heart racing -new diagnosis 04/16/22 in ER Anticoagulated with Eliquis 5 mg twice daily  Chadsvasc score of 3 Patient is  followed by cardiology: Atrial fibrillation clinic and will get established Monday with primary cardiologist A/P: appropriately anticoagulated and rate controlled mainly without medicine- continue current meds   #hypertension S: medication: Lisinopril 20 mg once daily BP Readings from Last 3 Encounters:  06/15/22 110/62  05/20/22 116/60  04/29/22 118/76  A/P: Controlled. Continue current medications.   #hyperlipidemiawith coronary artery calcium 247 in 05/03/2018 64% for age S:  Medication:Atorvastatin 20 mg once daily Lab Results  Component Value Date   CHOL 103 12/16/2021   HDL 47.80 12/16/2021   LDLCALC 41 12/16/2021   TRIG 72.0 12/16/2021   CHOLHDL 2 12/16/2021  A/P: very well Controlled. Continue current medications.   #Gout- uric acid has been at goal under 6 S: 0 flares on no medication -patient did report he cut down on bacon in 2021 - wife helped to ration this and this seemed to help him Lab Results  Component Value Date   LABURIC 6.5 12/16/2021  A/P:doing well without meds even with slightly elevated uric acid   # Aortic dilation/ aneurysm hx- Patient had this previously listed as aortic aneurysm - on repeat ultrasound in 2021 with no measurement over 3 cm - there was no code available for aortic dilation - planned to follow this due to prior report of aneurysm - likely to repeat 3-5 years from last imaging   #Vertigo/BPPV-sparing issues - still has meclizine 25 mg on hand if needed - has not needed recently  #mild thrombocytopenia- chronic stable, other cell lines ok- trend at least annually - recently checked -also mild anemia last check so we will rechek today Lab Results  Component Value Date   WBC 5.2 05/20/2022   HGB 12.5 (L) 05/20/2022   HCT 39.4 05/20/2022   MCV 87.2 05/20/2022   PLT 129 (L) 05/20/2022   #ongoing neck pain- has seen Dr. Georgina Snell, has had PT, has had MRI- recommended seeing Dr. Ernestina Patches- had injection c2--c3 on June 15th and didn't work substantially maybe 30%- called back and was told to return to sports medicine. At rest 2/10- with motion up to 5/10 in daytime- nighttime is the worst up to 10/10- getting comfortable in the bed -prefers to avoid surgery.   Recommended follow up: Return in about 6 months (around 12/16/2022) for physical or sooner if needed.Schedule b4 you leave. Future Appointments  Date Time Provider [place]  06/20/2022  1:40 PM Donato Heinz, MD [facility]    Lab/Order  associations:   ICD-10-CM   1. Paroxysmal atrial fibrillation (HCC)  I48.0     2. Essential hypertension  I10     3. Hyperlipidemia, unspecified hyperlipidemia type  E78.5       No orders of the defined types were placed in this encounter.   Return precautions advised.  Garret Reddish, MD

## 2022-06-19 NOTE — Progress Notes (Signed)
Cardiology Office Note:    Date:  06/20/2022   ID:  Edward Goodwin, DOB [birth year], MRN [id number]  PCP:  [name], MD  Cardiologist:  None  Electrophysiologist:  None   Referring MD: Oliver Barre, PA   Chief Complaint  Patient presents with   Atrial Fibrillation    History of Present Illness:    Edward Goodwin is a 71 y.o. male with a hx of paroxysmal atrial fibrillation, ulcerative colitis, hypertension, psoriasis who is referred by Edward So, PA for evaluation of atrial fibrillation.  He was diagnosed with A-fib and had ED visit 04/16/2022.  CHA2DS2-VASc score 3, was started on Eliquis 5 mg twice daily as well as diltiazem 30 mg as needed for palpitations/tachycardia.  He reports that he was dehydrated when he went into A-fib.  He felt tired but did not note any palpitations.  His wife checked his pulse and noted it was fast and irregular.  He reports occasional lightheadedness but denies any syncope.  Does report he has occasional chest pain.  Describes tightness in center of his chest.  Has not noted relationship with exertion.  He plays golf twice per week for exercise.  No lower extremity edema.  He is taking Eliquis, denies any bleeding issues.  Smoked for 20 years, quit in his early 42s.  Thinks father had MI in 57s.   Past Medical History:  Diagnosis Date   Chicken pox    Colitis, ulcerative ([place])    Hypertension    Macular degeneration, bilateral 07/2012   areds 2    Psoriasis     Past Surgical History:  Procedure Laterality Date   ACNE CYST REMOVAL     CATARACT EXTRACTION W/ INTRAOCULAR LENS IMPLANT  10/2014   bilat   COLONOSCOPY     FOOT SURGERY  1988   SHOULDER ARTHROSCOPY  2007   Right   TONSILLECTOMY      Current Medications: Current Meds  Medication Sig   Acetaminophen (TYLENOL PO) Take 1 tablet by mouth as needed. Taking Tylenol Arthritis   apixaban (ELIQUIS) 5 MG TABS tablet Take 1 tablet (5 mg total) by mouth 2 (two) times daily.    Ascorbic Acid (VITAMIN C PO) Take 1 tablet by mouth daily. 1000 mg   atorvastatin (LIPITOR) 20 MG tablet TAKE 1 TABLET BY MOUTH EVERY DAY   clobetasol ointment (TEMOVATE) 0.05 % Apply topically 2 (two) times daily as needed.   diltiazem (CARDIZEM) 30 MG tablet Take one tablet by mouth every 4 hrs as needed, Heart rate needs to be above 100, top number blood pressure above 100   diphenhydramine-acetaminophen (TYLENOL PM) 25-500 MG TABS tablet Take 1 tablet by mouth at bedtime as needed.   Faricimab-svoa (VABYSMO IZ) 650 mg by Intravitreal route every 30 (thirty) days.   L-Lysine 1000 MG TABS Take 500 mg by mouth daily.    lisinopril (ZESTRIL) 20 MG tablet TAKE 1 TABLET BY MOUTH EVERY DAY   meclizine (ANTIVERT) 25 MG tablet Take 1 tablet (25 mg total) by mouth every 6 (six) hours as needed for dizziness.   metoprolol tartrate (LOPRESSOR) 25 MG tablet Take 1 tablet (25 mg) TWO hours prior to CT scan   Multiple Vitamins-Minerals (PRESERVISION AREDS 2) CAPS Take 1 capsule by mouth 2 (two) times daily before lunch and supper.   Propylene Glycol (SYSTANE COMPLETE OP) Apply 1 drop to eye 2 (two) times daily.     Allergies:   Sulfonamide derivatives   Social History  Socioeconomic History   Marital status: Married    Spouse name: Not on file   Number of children: 1   Years of education: 40   Highest education level: Not on file  Occupational History   Occupation: Herbalist    Comment: retired   Tobacco Use   Smoking status: Former    Types: Cigarettes    Quit date: 10/23/1989    Years since quitting: 32.6   Smokeless tobacco: Never  Vaping Use   Vaping Use: Never used  Substance and Sexual Activity   Alcohol use: Yes    Alcohol/week: 0.0 standard drinks of alcohol    Comment: rare glass of wine - less than once a year   Drug use: No   Sexual activity: Not Currently  Other Topics Concern   Not on file  Social History Narrative   Married 1971. 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 [employer] 6 years.       ACP: HCPOA - wife. Yes for acute CPR, no for prolonged mechanical ventilatory support, no for prolonged artificial nutrition or other heroic measures leaving him in an incapacitated state.    Social Determinants of Health   Financial Resource Strain: Low Risk  (01/27/2022)   Overall Financial Resource Strain (CARDIA)    Difficulty of Paying Living Expenses: Not hard at all  Food Insecurity: No Food Insecurity (01/27/2022)   Hunger Vital Sign    Worried About Running Out of Food in the Last Year: Never true    Ran Out of Food in the Last Year: Never true  Transportation Needs: No Transportation Needs (01/27/2022)   PRAPARE - Hydrologist (Medical): No    Lack of Transportation (Non-Medical): No  Physical Activity: Sufficiently Active (01/27/2022)   Exercise Vital Sign    Days of Exercise per Week: 2 days    Minutes of Exercise per Session: 150+ min  Stress: No Stress Concern Present (01/27/2022)   [place]    Feeling of Stress : Not at all  Social Connections: Moderately Integrated (01/27/2022)   Social Connection and Isolation Panel [NHANES]    Frequency of Communication with Friends and Family: More than three times a week    Frequency of Social Gatherings with Friends and Family: More than three times a week    Attends Religious Services: More than 4 times per year    Active Member of [employer] or Organizations: No    Attends Music therapist: Never    Marital Status: Married     Family History: The patient's family history includes Arthritis in his paternal grandmother; Breast cancer in his mother; Dementia in his mother; Diabetes in his daughter and maternal uncle; Emphysema in his maternal grandfather; Heart disease in his  father and mother; Hepatitis C in his brother; Hypertension in his father and mother; Hypothyroidism in his mother; Stroke in his maternal grandmother and mother. There is no history of Colon cancer, Esophageal cancer, Stomach cancer, or Rectal cancer.  ROS:   Please see the history of present illness.     All other systems reviewed and are negative.  EKGs/Labs/Other Studies Reviewed:    The following studies were reviewed today:   EKG:   06/20/2022: Normal sinus rhythm, rate 61, no ST abnormality  Recent Labs:  04/16/2022: ALT 9; B Natriuretic Peptide 402.8; BUN 21; Creatinine, Ser 1.03; Magnesium 2.1; Potassium 4.3; Sodium 141 06/15/2022: Hemoglobin 13.1; Platelets 129.0; TSH 1.15  Recent Lipid Panel    Component Value Date/Time   CHOL 103 12/16/2021 1031   TRIG 72.0 12/16/2021 1031   TRIG 58 09/01/2006 0749   HDL 47.80 12/16/2021 1031   CHOLHDL 2 12/16/2021 1031   VLDL 14.4 12/16/2021 1031   LDLCALC 41 12/16/2021 1031    Physical Exam:    VS:  BP (!) 110/58   Pulse 61   Ht 5' 9.5" (1.765 m)   Wt 191 lb 6.4 oz (86.8 kg)   SpO2 98%   BMI 27.86 kg/m     Wt Readings from Last 3 Encounters:  06/20/22 191 lb 6.4 oz (86.8 kg)  06/15/22 191 lb 3.2 oz (86.7 kg)  05/20/22 191 lb 6.4 oz (86.8 kg)     GEN:  Well nourished, well developed in no acute distress HEENT: Normal NECK: No JVD; No carotid bruits LYMPHATICS: No lymphadenopathy CARDIAC: RRR, no murmurs, rubs, gallops RESPIRATORY:  Clear to auscultation without rales, wheezing or rhonchi  ABDOMEN: Soft, non-tender, non-distended MUSCULOSKELETAL:  No edema; No deformity  SKIN: Warm and dry NEUROLOGIC:  Alert and oriented x 3 PSYCHIATRIC:  Normal affect   ASSESSMENT:    1. Paroxysmal atrial fibrillation (HCC)   2. Chest pain of uncertain etiology   3. Essential hypertension   4. Hyperlipidemia, unspecified hyperlipidemia type    PLAN:    Paroxysmal atrial fibrillation: He was diagnosed with A-fib at ED visit  04/16/2022.  CHA2DS2-VASc score 3, was started on Eliquis 5 mg twice daily as well as diltiazem 30 mg as needed for palpitations/tachycardia.  Echo 05/02/22 showed normal biventricular function, no significant valvular disease.  -Continue Eliquis 5 mg twice daily -Continue diltiazem 30 mg as needed -Zio patch x 7 days to evaluate A-fib burden   Chest pain: Calcium score 247 on 05/03/2018.  He reports he has been having intermittent chest pain.  Atypical in description. -Recommend coronary CTA to evaluate for obstructive CAD.  Will give Lopressor 25 mg prior to study -Continue atorvastatin  Hypertension: On lisinopril 20 mg daily.  Appears controlled  Hyperlipidemia: On atorvastatin 20 mg daily.  LDL 41 on 12/16/2021   RTC in 6 months    Medication Adjustments/Labs and Tests Ordered: Current medicines are reviewed at length with the patient today.  Concerns regarding medicines are outlined above.  Orders Placed This Encounter  Procedures   CT CORONARY MORPH W/CTA COR W/SCORE W/CA W/CM &/OR WO/CM   Basic metabolic panel   LONG TERM MONITOR (3-14 DAYS)   EKG 12-Lead   Meds ordered this encounter  Medications   metoprolol tartrate (LOPRESSOR) 25 MG tablet    Sig: Take 1 tablet (25 mg) TWO hours prior to CT scan    Dispense:  1 tablet    Refill:  0    Patient Instructions  Medication Instructions:  Your physician recommends that you continue on your current medications as directed. Please refer to the Current Medication list given to you today.  AM of CT scan-take metoprolol 25 mg TWO hours prior  *If you need a refill on your cardiac medications before your next appointment, please call your pharmacy*   Lab Work: BMET today  If you have labs (blood work) drawn today and your tests are completely normal, you will receive your results only by: [place] (if you have MyChart) OR A paper copy  in the mail If you have any lab test that is abnormal or we need to change your  treatment, we will call you to review the results.   Testing/Procedures: Coronary CTA-see instructions below  ZIO XT- Long Term Monitor Instructions   Your physician has requested you wear a ZIO patch monitor for _7_ days.  This is a single patch monitor.   [employer] supplies one patch monitor per enrollment. Additional stickers are not available. Please do not apply patch if you will be having a Nuclear Stress Test, Echocardiogram, Cardiac CT, MRI, or Chest Xray during the period you would be wearing the monitor. The patch cannot be worn during these tests. You cannot remove and re-apply the ZIO XT patch monitor.  Your ZIO patch monitor will be sent Fed Ex from [employer] directly to your home address. It may take 3-5 days to receive your monitor after you have been enrolled.  Once you have received your monitor, please review the enclosed instructions. Your monitor has already been registered assigning a specific monitor serial # to you.  Billing and Patient Assistance Program Information   We have supplied [employer] with any of your insurance information on file for billing purposes. [employer] offers a sliding scale Patient Assistance Program for patients that do not have insurance, or whose insurance does not completely cover the cost of the ZIO monitor.   You must apply for the Patient Assistance Program to qualify for this discounted rate.     To apply, please call [employer] at [phone], select option 4, then select option 2, and ask to apply for Patient Assistance Program.  Theodore Demark will ask your household income, and how many people are in your household.  They will quote your out-of-pocket cost based on that information.  [employer] will also be able to set up a 15-month interest-free payment plan if needed.  Applying the monitor   Shave hair from upper left chest.  Hold abrader disc by orange tab. Rub abrader in 40 strokes over the upper left chest as indicated in your monitor  instructions.  Clean area with 4 enclosed alcohol pads. Let dry.  Apply patch as indicated in monitor instructions. Patch will be placed under collarbone on left side of chest with arrow pointing upward.  Rub patch adhesive wings for 2 minutes. Remove white label marked "1". Remove the white label marked "2". Rub patch adhesive wings for 2 additional minutes.  While looking in a mirror, press and release button in center of patch. A small green light will flash 3-4 times. This will be your only indicator that the monitor has been turned on. ?  Do not shower for the first 24 hours. You may shower after the first 24 hours.  Press the button if you feel a symptom. You will hear a small click. Record Date, Time and Symptom in the Patient Logbook.  When you are ready to remove the patch, follow instructions on the last 2 pages of the Patient Logbook. Stick patch monitor onto the last page of Patient Logbook.  Place Patient Logbook in the blue and white box.  Use locking tab on box and tape box closed securely.  The blue and white box has prepaid postage on it. Please place it in the mailbox as soon as possible. Your physician should have your test results approximately 7 days after the monitor has been mailed back to [facility]  Call [facility] [phone] you have questions regarding your ZIO XT  patch monitor. Call them immediately if you see an orange light blinking on your monitor.  If your monitor falls off in less than 4 days, contact our Monitor department at [phone]. ?If your monitor becomes loose or falls off after 4 days call [employer] at [phone] for suggestions on securing your monitor.?  Follow-Up: At [facility], you and your health needs are our priority.  As part of our continuing mission to provide you with exceptional heart care, we have created designated Provider Care Teams.  These Care Teams include your primary Cardiologist (physician) and  Advanced Practice Providers (APPs -  Physician Assistants and Nurse Practitioners) who all work together to provide you with the care you need, when you need it.  We recommend signing up for the patient portal called "MyChart".  Sign up information is provided on this After Visit Summary.  MyChart is used to connect with patients for Virtual Visits (Telemedicine).  Patients are able to view lab/test results, encounter notes, upcoming appointments, etc.  Non-urgent messages can be sent to your provider as well.   To learn more about what you can do with MyChart, go to [email or web address].    Your next appointment:   6 month(s)  The format for your next appointment:   In Person  Provider:   Dr. Gardiner Rhyme  Other Instructions   Your cardiac CT will be scheduled at one of the below locations:   [facility] [address] [place], [place] [address] [phone]   If scheduled at [facility], please arrive at the [facility] and Children's Entrance (Entrance C2) of [facility] 30 minutes prior to test start time. You can use the FREE valet parking offered at entrance C (encouraged to control the heart rate for the test)  Proceed to the [facility] Radiology Department (first floor) to check-in and test prep.  All radiology patients and guests should use entrance C2 at [facility], accessed from [facility], even though the hospital's physical address listed is [address].     Please follow these instructions carefully (unless otherwise directed):  Hold all erectile dysfunction medications at least 3 days (72 hrs) prior to test.  On the Night Before the Test: Be sure to Drink plenty of water. Do not consume any caffeinated/decaffeinated beverages or chocolate 12 hours prior to your test. Do not take any antihistamines 12 hours prior to your test.  On the Day of the Test: Drink plenty of water until 1 hour prior to the  test. Do not eat any food 4 hours prior to the test. You may take your regular medications prior to the test.  Take metoprolol (Lopressor) two hours prior to test.      After the Test: Drink plenty of water. After receiving IV contrast, you may experience a mild flushed feeling. This is normal. On occasion, you may experience a mild rash up to 24 hours after the test. This is not dangerous. If this occurs, you can take Benadryl 25 mg and increase your fluid intake. If you experience trouble breathing, this can be serious. If it is severe call 911 IMMEDIATELY. If it is mild, please call our office. If you take any of these medications: Glipizide/Metformin, Avandament, Glucavance, please do not take 48 hours after completing test unless otherwise instructed.  We will call to schedule your test 2-4 weeks out understanding that some insurance companies will need an authorization prior to the service being performed.   For  non-scheduling related questions, please contact the cardiac imaging nurse navigator should you have any questions/concerns: Marchia Bond, Cardiac Imaging Nurse Navigator Gordy Clement, Cardiac Imaging Nurse Navigator [facility] Direct Office Dial: [phone]   For scheduling needs, including cancellations and rescheduling, please call [place], [phone].           Signed, Donato Heinz, MD  06/20/2022 5:10 PM    [facility]

## 2022-06-20 ENCOUNTER — Encounter: Payer: Self-pay | Admitting: Physical Medicine and Rehabilitation

## 2022-06-20 ENCOUNTER — Encounter: Payer: Self-pay | Admitting: Cardiology

## 2022-06-20 ENCOUNTER — Ambulatory Visit: Payer: PPO | Admitting: Cardiology

## 2022-06-20 ENCOUNTER — Ambulatory Visit (INDEPENDENT_AMBULATORY_CARE_PROVIDER_SITE_OTHER): Payer: PPO

## 2022-06-20 ENCOUNTER — Ambulatory Visit: Payer: PPO | Admitting: Physical Medicine and Rehabilitation

## 2022-06-20 VITALS — BP 110/58 | HR 61 | Ht 69.5 in | Wt 191.4 lb

## 2022-06-20 VITALS — BP 137/81 | HR 76

## 2022-06-20 DIAGNOSIS — M47812 Spondylosis without myelopathy or radiculopathy, cervical region: Secondary | ICD-10-CM

## 2022-06-20 DIAGNOSIS — I480 Paroxysmal atrial fibrillation: Secondary | ICD-10-CM | POA: Diagnosis not present

## 2022-06-20 DIAGNOSIS — M542 Cervicalgia: Secondary | ICD-10-CM | POA: Diagnosis not present

## 2022-06-20 DIAGNOSIS — E785 Hyperlipidemia, unspecified: Secondary | ICD-10-CM | POA: Diagnosis not present

## 2022-06-20 DIAGNOSIS — M25511 Pain in right shoulder: Secondary | ICD-10-CM

## 2022-06-20 DIAGNOSIS — R079 Chest pain, unspecified: Secondary | ICD-10-CM

## 2022-06-20 DIAGNOSIS — M4802 Spinal stenosis, cervical region: Secondary | ICD-10-CM

## 2022-06-20 DIAGNOSIS — G8929 Other chronic pain: Secondary | ICD-10-CM | POA: Diagnosis not present

## 2022-06-20 DIAGNOSIS — I10 Essential (primary) hypertension: Secondary | ICD-10-CM

## 2022-06-20 MED ORDER — METOPROLOL TARTRATE 25 MG PO TABS: ORAL_TABLET | ORAL | 0 refills | Status: DC

## 2022-06-20 NOTE — Progress Notes (Unsigned)
Pt has hx of inj on 04/28/22 pt state it help a little bit.Pt state neck pain that travels to his right shoulder. Pt state any movement with his head or neck makes the pain worse. Pt state he takes over the counter pain meds and uses heat and ice to help ease his pain.   Numeric Pain Rating Scale and Functional Assessment Average Pain 9 Pain Right Now 4 My pain is constant and stabbing Pain is worse with: some activites and getting up or turning in the bed makes the pain worse. Pain improves with: heat/ice, medication, and injections   In the last MONTH (on 0-10 scale) has pain interfered with the following?  1. General activity like being  able to carry out your everyday physical activities such as walking, climbing stairs, carrying groceries, or moving a chair?  Rating(5)  2. Relation with others like being able to carry out your usual social activities and roles such as  activities at home, at work and in your community. Rating(6)  3. Enjoyment of life such that you have  been bothered by emotional problems such as feeling anxious, depressed or irritable?  Rating(7)

## 2022-06-20 NOTE — Patient Instructions (Addendum)
Medication Instructions:  Your physician recommends that you continue on your current medications as directed. Please refer to the Current Medication list given to you today.  AM of CT scan-take metoprolol 25 mg TWO hours prior  *If you need a refill on your cardiac medications before your next appointment, please call your pharmacy*   Lab Work: BMET today  If you have labs (blood work) drawn today and your tests are completely normal, you will receive your results only by: [place] (if you have MyChart) OR A paper copy in the mail If you have any lab test that is abnormal or we need to change your treatment, we will call you to review the results.   Testing/Procedures: Coronary CTA-see instructions below  ZIO XT- Long Term Monitor Instructions   Your physician has requested you wear a ZIO patch monitor for _7_ days.  This is a single patch monitor.   [employer] supplies one patch monitor per enrollment. Additional stickers are not available. Please do not apply patch if you will be having a Nuclear Stress Test, Echocardiogram, Cardiac CT, MRI, or Chest Xray during the period you would be wearing the monitor. The patch cannot be worn during these tests. You cannot remove and re-apply the ZIO XT patch monitor.  Your ZIO patch monitor will be sent Fed Ex from [employer] directly to your home address. It may take 3-5 days to receive your monitor after you have been enrolled.  Once you have received your monitor, please review the enclosed instructions. Your monitor has already been registered assigning a specific monitor serial # to you.  Billing and Patient Assistance Program Information   We have supplied [employer] with any of your insurance information on file for billing purposes. [employer] offers a sliding scale Patient Assistance Program for patients that do not have insurance, or whose insurance does not completely cover the cost of the ZIO monitor.   You must apply for  the Patient Assistance Program to qualify for this discounted rate.     To apply, please call [employer] at [phone], select option 4, then select option 2, and ask to apply for Patient Assistance Program.  Theodore Demark will ask your household income, and how many people are in your household.  They will quote your out-of-pocket cost based on that information.  [employer] will also be able to set up a 65-month interest-free payment plan if needed.  Applying the monitor   Shave hair from upper left chest.  Hold abrader disc by orange tab. Rub abrader in 40 strokes over the upper left chest as indicated in your monitor instructions.  Clean area with 4 enclosed alcohol pads. Let dry.  Apply patch as indicated in monitor instructions. Patch will be placed under collarbone on left side of chest with arrow pointing upward.  Rub patch adhesive wings for 2 minutes. Remove white label marked "1". Remove the white label marked "2". Rub patch adhesive wings for 2 additional minutes.  While looking in a mirror, press and release button in center of patch. A small green light will flash 3-4 times. This will be your only indicator that the monitor has been turned on. ?  Do not shower for the first 24 hours. You may shower after the first 24 hours.  Press the button if you feel a symptom. You will hear a small click. Record Date, Time and Symptom in the Patient Logbook.  When you are ready to remove the patch, follow instructions on the last 2  pages of the Patient Logbook. Stick patch monitor onto the last page of Patient Logbook.  Place Patient Logbook in the blue and white box.  Use locking tab on box and tape box closed securely.  The blue and white box has prepaid postage on it. Please place it in the mailbox as soon as possible. Your physician should have your test results approximately 7 days after the monitor has been mailed back to [facility].  Call [place] at [phone] if you have  questions regarding your ZIO XT patch monitor. Call them immediately if you see an orange light blinking on your monitor.  If your monitor falls off in less than 4 days, contact our Monitor department at [phone]. ?If your monitor becomes loose or falls off after 4 days call [employer] at [phone] for suggestions on securing your monitor.?  Follow-Up: At [facility], you and your health needs are our priority.  As part of our continuing mission to provide you with exceptional heart care, we have created designated Provider Care Teams.  These Care Teams include your primary Cardiologist (physician) and Advanced Practice Providers (APPs -  Physician Assistants and Nurse Practitioners) who all work together to provide you with the care you need, when you need it.  We recommend signing up for the patient portal called "MyChart".  Sign up information is provided on this After Visit Summary.  MyChart is used to connect with patients for Virtual Visits (Telemedicine).  Patients are able to view lab/test results, encounter notes, upcoming appointments, etc.  Non-urgent messages can be sent to your provider as well.   To learn more about what you can do with MyChart, go to [email or web address].    Your next appointment:   6 month(s)  The format for your next appointment:   In Person  Provider:   Dr. Gardiner Rhyme  Other Instructions   Your cardiac CT will be scheduled at one of the below locations:   [facility] [address] [place], [place] [address] [phone]   If scheduled at [facility], please arrive at the [facility] and Children's Entrance (Entrance C2) of [facility] 30 minutes prior to test start time. You can use the FREE valet parking offered at entrance C (encouraged to control the heart rate for the test)  Proceed to the [facility] Radiology Department (first floor) to check-in and test prep.  All radiology patients and guests should use  entrance C2 at [facility], accessed from [facility], even though the hospital's physical address listed is [address]     Please follow these instructions carefully (unless otherwise directed):  Hold all erectile dysfunction medications at least 3 days (72 hrs) prior to test.  On the Night Before the Test: Be sure to Drink plenty of water. Do not consume any caffeinated/decaffeinated beverages or chocolate 12 hours prior to your test. Do not take any antihistamines 12 hours prior to your test.  On the Day of the Test: Drink plenty of water until 1 hour prior to the test. Do not eat any food 4 hours prior to the test. You may take your regular medications prior to the test.  Take metoprolol (Lopressor) two hours prior to test.      After the Test: Drink plenty of water. After receiving IV contrast, you may experience a mild flushed feeling. This is normal. On occasion, you may experience a mild rash up to 24 hours after the test. This  is not dangerous. If this occurs, you can take Benadryl 25 mg and increase your fluid intake. If you experience trouble breathing, this can be serious. If it is severe call 911 IMMEDIATELY. If it is mild, please call our office. If you take any of these medications: Glipizide/Metformin, Avandament, Glucavance, please do not take 48 hours after completing test unless otherwise instructed.  We will call to schedule your test 2-4 weeks out understanding that some insurance companies will need an authorization prior to the service being performed.   For non-scheduling related questions, please contact the cardiac imaging nurse navigator should you have any questions/concerns: Marchia Bond, Cardiac Imaging Nurse Navigator Gordy Clement, Cardiac Imaging Nurse Navigator [facility] Direct Office Dial: [phone]   For scheduling needs, including cancellations and rescheduling, please call  [place], [phone].

## 2022-06-20 NOTE — Progress Notes (Unsigned)
Edward Goodwin - 71 y.o. male MRN [id number]  Date of birth: Jan 27, 1951  Office Visit Note: Visit Date: 06/20/2022 PCP: [name], MD Referred by: [name], MD  Subjective: Chief Complaint  Patient presents with   Neck - Pain   Right Shoulder - Pain   HPI: Edward Goodwin is a 71 y.o. male who comes in today for evaluation of chronic, worsening and severe right sided neck pain with referral of pain to right shoulder. Pain ongoing intermittently for several months and is exacerbated by movement and activity, he describes pain as sore and aching sensation, currently rates as 8 out of 10. Patient states pain is most severe when laying down to sleep at night.  Patient reports some relief of pain with home exercise regimen, rest and over-the-counter medications. Patient recently completed regimen of physical therapy with [facility] and reports no relief of pain with treatments.  Patient's recent cervical MRI exhibits right C2-C3 facet edema, mild left C5-6 neural foraminal stenosis, no high-grade spinal canal stenosis noted. Patient recently underwent right C2-C3 facet joint/medial branch block in our office on 04/28/2022 and reports minimal relief of pain, approximately 30% with this procedure. Patients states he does not feel this injection helped to alleviate his pain. Patient was recently started on Eliquis and is being treated Malka So, PA at [facility]. Patient does have upcoming appointment with Dr. Oswaldo Milian with [facility]. Patient denies focal weakness, numbness and tingling. Patient denies recent trauma or falls.    Review of Systems  Musculoskeletal:  Positive for neck pain.  Neurological:  Negative for tingling, sensory change, focal weakness and weakness.  All other systems reviewed and are negative.  Otherwise per HPI.  Assessment & Plan: Visit Diagnoses:    ICD-10-CM   1. Cervicalgia  M54.2  Ambulatory referral to Physical Medicine Rehab    2. Cervical spondylosis without myelopathy  M47.812 Ambulatory referral to Physical Medicine Rehab    3. Chronic right shoulder pain  M25.511 Ambulatory referral to Physical Medicine Rehab   G89.29     4. Foraminal stenosis of cervical region  M48.02 Ambulatory referral to Physical Medicine Rehab       Plan: Findings:  Chronic, worsening and severe right sided neck pain with referral of pain to right shoulder. Patient continues to have severe pain despite good conservative therapies such as formal physical therapy, home exercise regimen, rest and use of medications. Patients clinical presentation and exam are clinically complex, differentials could include facet mediated pain and cervical radiculopathy. Recent cervical MRI imaging does exhibit both right C2-C3 facet edema and mild foraminal narrowing on the left at C5-C6. Minimal relief of pain with recent right C2-C3 facet joint/medial branch block. Next step is to perform right C7-T1 interlaminar epidural steroid injection under fluoroscopic guidance. We will contact [facility] for permission to stop Eliquis for injection procedure. If patient gets good relief with cervical epidural steroid injection we can repeat this procedure infrequently as needed. If his pain persists we discuss re-grouping with physical therapy/chiropractor. No red flag symptoms noted upon exam today.     Meds & Orders: No orders of the defined types were placed in this encounter.   Orders Placed This Encounter  Procedures   Ambulatory referral to Physical Medicine Rehab    Follow-up: Return for Right C7-T1 interlaminar epidural steroid injection.   Procedures: No procedures performed      Clinical History: EXAM:  MRI CERVICAL SPINE WITHOUT CONTRAST   TECHNIQUE: Multiplanar, multisequence MR imaging of the cervical spine was performed. No intravenous contrast was administered.    COMPARISON:  None Available.   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 Right C2-3 facet edema. No fracture or acute abnormality.   Cord: Normal signal and morphology.   Posterior Fossa, vertebral arteries, paraspinal tissues: Negative.   Disc levels:   C1-2: Unremarkable.   C2-3: Right facet arthrosis and minimal disc bulge. There is no spinal canal stenosis. No neural foraminal stenosis.   C3-4: Partial congenital fusion across the disc space. There is no spinal canal stenosis. No neural foraminal stenosis.   C4-5: Small disc bulge with minimal uncovertebral spurring. Mild right facet hypertrophy. There is no spinal canal stenosis. No neural foraminal stenosis.   C5-6: Disc space narrowing with small bulge and uncovertebral spurring, left-greater-than-right. There is no spinal canal stenosis. Mild left neural foraminal stenosis.   C6-7: Small disc bulge with uncovertebral spurring. There is no spinal canal stenosis. No neural foraminal stenosis.   C7-T1: Mild disc degeneration. There is no spinal canal stenosis. No neural foraminal stenosis.   IMPRESSION: 1. Right C2-3 facet edema may be a source of local neck pain. 2. Mild left C5-6 neural foraminal stenosis secondary to combination of disc bulge and uncovertebral spurring. 3. No spinal canal stenosis. 4. 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     Electronically Signed   By: Ulyses Jarred M.D.   On: 03/21/2022 14:22   He reports that he quit smoking about 32 years ago. His smoking use included cigarettes. He has never used smokeless tobacco.  Recent Labs    12/16/21 1031  LABURIC 6.5    Objective:  VS:  HT:    WT:   BMI:     BP:137/81  HR:76bpm  TEMP: ( )  RESP:  Physical Exam Vitals and nursing note reviewed.  HENT:     Head: Normocephalic and atraumatic.     Right Ear: External ear normal.     Left Ear: External ear normal.     Nose:  Nose normal.     Mouth/Throat:     Mouth: Mucous membranes are moist.  Eyes:     Extraocular Movements: Extraocular movements intact.  Cardiovascular:     Rate and Rhythm: Normal rate.     Pulses: Normal pulses.  Pulmonary:     Effort: Pulmonary effort is normal.  Abdominal:     General: Abdomen is flat. There is no distension.  Musculoskeletal:        General: Tenderness present.     Cervical back: Tenderness present.     Comments: Discomfort noted with side-to-side rotation. Patient has good strength in the upper extremities including 5 out of 5 strength in wrist extension, long finger flexion and APB. There is no atrophy of the hands intrinsically.  Sensation intact bilaterally. Negative Hoffman's sign.    Skin:    General: Skin is warm and dry.     Capillary Refill: Capillary refill takes less than 2 seconds.  Neurological:     General: No focal deficit present.     Mental Status: He is alert and oriented to person, place, and time.  Psychiatric:        Mood and Affect: Mood normal.        Behavior: Behavior normal.     Ortho Exam  Imaging: No results found.  Past Medical/Family/Surgical/Social History: Medications & Allergies  reviewed per EMR, new medications updated. Patient Active Problem List   Diagnosis Date Noted   Paroxysmal atrial fibrillation ([place]) 04/21/2022   Secondary hypercoagulable state ([place]) 04/21/2022   nonobstructive CAD (coronary artery disease)- CAC 247 on 05/03/2018  12/16/2021   Abdominal aortic aneurysm (AAA) without rupture, unspecified part ([place])    Former smoker 04/19/2018   Hyperlipidemia 04/19/2018   Vertigo 12/19/2017   Onychomycosis 12/19/2017   Trochanteric bursitis of left hip 03/31/2015   Gout 03/24/2014   Elevated PSA, less than 10 ng/ml 09/09/2013   Macular degeneration, bilateral 07/15/2012   Obesity (BMI 30.0-34.9) 12/02/2008   Essential hypertension 08/20/2007   Ulcerative colitis ([place]) 08/20/2007   PSORIASIS 08/20/2007    Past Medical History:  Diagnosis Date   Chicken pox    Colitis, ulcerative ([place])    Hypertension    Macular degeneration, bilateral 07/2012   areds 2    Psoriasis    Family History  Problem Relation Age of Onset   Heart disease Mother    Breast cancer Mother    Hypertension Mother    Stroke Mother    Hypothyroidism Mother    Dementia Mother    Heart disease Father        CAD/MI 74s or 74s smoker. led to death age 30.    Hypertension Father    Diabetes Daughter    Diabetes Maternal Uncle    Hepatitis C Brother    Stroke Maternal Grandmother    Emphysema Maternal Grandfather    Arthritis Paternal Grandmother    Colon cancer Neg Hx    Esophageal cancer Neg Hx    Stomach cancer Neg Hx    Rectal cancer Neg Hx    Past Surgical History:  Procedure Laterality Date   ACNE CYST REMOVAL     CATARACT EXTRACTION W/ INTRAOCULAR LENS IMPLANT  10/2014   bilat   COLONOSCOPY     FOOT SURGERY  1988   SHOULDER ARTHROSCOPY  2007   Right   TONSILLECTOMY     Social History   Occupational History   Occupation: Herbalist    Comment: retired   Tobacco Use   Smoking status: Former    Types: Cigarettes    Quit date: 10/23/1989    Years since quitting: 32.6   Smokeless tobacco: Never  Vaping Use   Vaping Use: Never used  Substance and Sexual Activity   Alcohol use: Yes    Alcohol/week: 0.0 standard drinks of alcohol    Comment: rare glass of wine - less than once a year   Drug use: No   Sexual activity: Not Currently

## 2022-06-20 NOTE — Progress Notes (Unsigned)
ZIO XT - 7 day mailed to pt's home address

## 2022-06-21 LAB — BASIC METABOLIC PANEL
BUN/Creatinine Ratio: 18 (ref 10–24)
BUN: 19 mg/dL (ref 8–27)
CO2: 25 mmol/L (ref 20–29)
Calcium: 9.1 mg/dL (ref 8.6–10.2)
Chloride: 101 mmol/L (ref 96–106)
Creatinine, Ser: 1.05 mg/dL (ref 0.76–1.27)
Glucose: 90 mg/dL (ref 70–99)
Potassium: 4.5 mmol/L (ref 3.5–5.2)
Sodium: 140 mmol/L (ref 134–144)
eGFR: 75 mL/min/{1.73_m2} (ref >59)

## 2022-06-29 DIAGNOSIS — I480 Paroxysmal atrial fibrillation: Secondary | ICD-10-CM

## 2022-06-30 ENCOUNTER — Telehealth: Payer: Self-pay | Admitting: Cardiology

## 2022-06-30 ENCOUNTER — Encounter: Payer: Self-pay | Admitting: Physical Medicine and Rehabilitation

## 2022-06-30 NOTE — Telephone Encounter (Signed)
   Pre-operative Risk Assessment    Patient Name: Edward Goodwin  DOB: 1951-03-17 MRN: [id number]      Request for Surgical Clearance    Procedure:   Patient getting an epidural injection  Date of Surgery:  Clearance 07/19/22                                 Surgeon:  Dr. Ernestina Patches Surgeon's Group or Practice Name:  [facility] Phone number:  [phone] Fax number:  [phone]   Type of Clearance Requested:  Pharmacy - apixaban (ELIQUIS) 5 MG TABS tablet    Type of Anesthesia:  None    Additional requests/questions:    Caller stated the Eliquis will need to be held for 2 days before procedure.  Signed, Heloise Beecham   06/30/2022, 10:20 AM

## 2022-06-30 NOTE — Telephone Encounter (Signed)
Patient with diagnosis of afib diagnosed 04/16/22 on Eliquis for anticoagulation.    Procedure: epidural injection  Date of procedure: 07/19/22  CHA2DS2-VASc Score = 3  This indicates a 3.2% annual risk of stroke. The patient's score is based upon: CHF History: 0 HTN History: 1 Diabetes History: 0 Stroke History: 0 Vascular Disease History: 1 Age Score: 1 Gender Score: 0   CrCl 4m/min Platelet count 129K  Per office protocol, patient can hold Eliquis for 3 days prior to procedure.    **This guidance is not considered finalized until pre-operative APP has relayed final recommendations.**

## 2022-06-30 NOTE — Telephone Encounter (Signed)
   Patient Name: Edward Goodwin  DOB: 1951/09/02 MRN: [id number]  Primary Cardiologist: None  Clinical pharmacists have reviewed the patient's past medical history, labs, and current medications as part of preoperative protocol coverage. The following recommendations have been made:   Patient with diagnosis of afib diagnosed 04/16/22 on Eliquis for anticoagulation.     Procedure: epidural injection   Date of procedure: 07/19/22   CHA2DS2-VASc Score = 3  This indicates a 3.2% annual risk of stroke. The patient's score is based upon: CHF History: 0 HTN History: 1 Diabetes History: 0 Stroke History: 0 Vascular Disease History: 1 Age Score: 1 Gender Score: 0   CrCl 20m/min Platelet count 129K   Per office protocol, patient can hold Eliquis for 3 days prior to procedure.   I will route this recommendation to the requesting party via Epic fax function and remove from pre-op pool.  Please call with questions.  ELenna Sciara NP 06/30/2022, 3:57 PM

## 2022-07-04 DIAGNOSIS — H353211 Exudative age-related macular degeneration, right eye, with active choroidal neovascularization: Secondary | ICD-10-CM | POA: Diagnosis not present

## 2022-07-04 DIAGNOSIS — H43813 Vitreous degeneration, bilateral: Secondary | ICD-10-CM | POA: Diagnosis not present

## 2022-07-04 DIAGNOSIS — H35033 Hypertensive retinopathy, bilateral: Secondary | ICD-10-CM | POA: Diagnosis not present

## 2022-07-04 DIAGNOSIS — H353122 Nonexudative age-related macular degeneration, left eye, intermediate dry stage: Secondary | ICD-10-CM | POA: Diagnosis not present

## 2022-07-08 ENCOUNTER — Telehealth (HOSPITAL_COMMUNITY): Payer: Self-pay | Admitting: Emergency Medicine

## 2022-07-08 NOTE — Telephone Encounter (Signed)
Attempted to call patient regarding upcoming cardiac CT appointment. °Left message on voicemail with name and callback number °Jacquelene Kopecky RN Navigator Cardiac Imaging °[facility] °[phone] Office °[phone] Cell ° °

## 2022-07-08 NOTE — Telephone Encounter (Signed)
Reaching out to patient to offer assistance regarding upcoming cardiac imaging study; pt verbalizes understanding of appt date/time, parking situation and where to check in, pre-test NPO status and medications ordered, and verified current allergies; name and call back number provided for further questions should they arise Marchia Bond RN Navigator Cardiac Imaging [facility] [phone] office [phone] cell  23m metoprolol tartrate Arrival 830 w/c entrance  Aware nitro L arm better for IV

## 2022-07-11 ENCOUNTER — Ambulatory Visit (HOSPITAL_COMMUNITY)
Admission: RE | Admit: 2022-07-11 | Discharge: 2022-07-11 | Disposition: A | Discharge status: Home / Self Care | Payer: PPO | Source: Ambulatory Visit | Attending: Internal Medicine | Admitting: Internal Medicine

## 2022-07-11 ENCOUNTER — Ambulatory Visit (HOSPITAL_BASED_OUTPATIENT_CLINIC_OR_DEPARTMENT_OTHER)
Admission: RE | Admit: 2022-07-11 | Discharge: 2022-07-11 | Disposition: A | Discharge status: Home / Self Care | Payer: PPO | Source: Ambulatory Visit | Attending: Internal Medicine | Admitting: Internal Medicine

## 2022-07-11 ENCOUNTER — Ambulatory Visit (HOSPITAL_COMMUNITY)
Admission: RE | Admit: 2022-07-11 | Discharge: 2022-07-11 | Disposition: A | Discharge status: Home / Self Care | Payer: PPO | Source: Ambulatory Visit | Attending: Cardiology | Admitting: Cardiology

## 2022-07-11 VITALS — BP 109/66

## 2022-07-11 DIAGNOSIS — R079 Chest pain, unspecified: Secondary | ICD-10-CM | POA: Insufficient documentation

## 2022-07-11 DIAGNOSIS — R931 Abnormal findings on diagnostic imaging of heart and coronary circulation: Secondary | ICD-10-CM | POA: Insufficient documentation

## 2022-07-11 DIAGNOSIS — I700 Atherosclerosis of aorta: Secondary | ICD-10-CM | POA: Insufficient documentation

## 2022-07-11 DIAGNOSIS — I2510 Atherosclerotic heart disease of native coronary artery without angina pectoris: Secondary | ICD-10-CM | POA: Diagnosis not present

## 2022-07-11 MED ORDER — NITROGLYCERIN 0.4 MG SL SUBL
SUBLINGUAL_TABLET | SUBLINGUAL | Status: AC
  Filled 2022-07-11: qty 2

## 2022-07-11 MED ORDER — NITROGLYCERIN 0.4 MG SL SUBL
0.8000 mg | SUBLINGUAL_TABLET | Freq: Once | SUBLINGUAL | Status: AC
  Administered 2022-07-11: 0.8 mg via SUBLINGUAL

## 2022-07-11 MED ORDER — IOHEXOL 350 MG/ML SOLN
100.0000 mL | Freq: Once | INTRAVENOUS | Status: AC | PRN
  Administered 2022-07-11: 100 mL via INTRAVENOUS

## 2022-07-13 DIAGNOSIS — I480 Paroxysmal atrial fibrillation: Secondary | ICD-10-CM | POA: Diagnosis not present

## 2022-07-14 NOTE — Progress Notes (Signed)
Cardiology Office Note:    Date:  07/15/2022   ID:  [name], DOB Apr 18, 1951, MRN [id number]  PCP:  [name], MD  Cardiologist:  None  Electrophysiologist:  None   Referring MD: [name], MD   Chief Complaint  Patient presents with   Coronary Artery Disease    History of Present Illness:    Edward Goodwin is a 70 y.o. male with a hx of paroxysmal atrial fibrillation, ulcerative colitis, hypertension, psoriasis who presents for follow-up.  He was referred by Malka So, PA for evaluation of atrial fibrillation.  He was diagnosed with A-fib and had ED visit 04/16/2022.  CHA2DS2-VASc score 3, was started on Eliquis 5 mg twice daily as well as diltiazem 30 mg as needed for palpitations/tachycardia.  He reports that he was dehydrated when he went into A-fib.  He felt tired but did not note any palpitations.  His wife checked his pulse and noted it was fast and irregular.  He reports occasional lightheadedness but denies any syncope.  Does report he has occasional chest pain.  Describes tightness in center of his chest.  Has not noted relationship with exertion.  He plays golf twice per week for exercise.  No lower extremity edema.  He is taking Eliquis, denies any bleeding issues.  Smoked for 20 years, quit in his early 50s.  Thinks father had MI in 41s.  Echocardiogram 05/02/2022 showed normal biventricular function, no significant valvular disease.  Coronary CTA on 07/11/2022 showed severe stenosis in mid to distal LAD (CT FFR 0.64), calcium score 610 (72nd percentile).  Since last clinic visit, he reports has been doing okay.  No chest pain over the last month.  Does report some dyspnea with walking up hills when he golfs.  Also with some lightheadedness with standing, denies any syncope.  He is taking Eliquis, denies any bleeding issues.  Past Medical History:  Diagnosis Date   Chicken pox    Colitis, ulcerative ([place])    Hypertension    Macular degeneration, bilateral  07/2012   areds 2    Psoriasis     Past Surgical History:  Procedure Laterality Date   ACNE CYST REMOVAL     CATARACT EXTRACTION W/ INTRAOCULAR LENS IMPLANT  10/2014   bilat   COLONOSCOPY     FOOT SURGERY  1988   SHOULDER ARTHROSCOPY  2007   Right   TONSILLECTOMY      Current Medications: Current Meds  Medication Sig   Acetaminophen (TYLENOL PO) Take 1 tablet by mouth as needed. Taking Tylenol Arthritis   apixaban (ELIQUIS) 5 MG TABS tablet Take 1 tablet (5 mg total) by mouth 2 (two) times daily.   Ascorbic Acid (VITAMIN C PO) Take 1 tablet by mouth daily. 1000 mg   aspirin EC 81 MG tablet Take 1 tablet (81 mg total) by mouth daily. Swallow whole.   atorvastatin (LIPITOR) 20 MG tablet TAKE 1 TABLET BY MOUTH EVERY DAY   clobetasol ointment (TEMOVATE) 0.05 % Apply topically 2 (two) times daily as needed.   diphenhydramine-acetaminophen (TYLENOL PM) 25-500 MG TABS tablet Take 1 tablet by mouth at bedtime as needed.   Faricimab-svoa (VABYSMO IZ) 650 mg by Intravitreal route every 30 (thirty) days.   L-Lysine 1000 MG TABS Take 500 mg by mouth daily.    lisinopril (ZESTRIL) 20 MG tablet TAKE 1 TABLET BY MOUTH EVERY DAY   Multiple Vitamins-Minerals (PRESERVISION AREDS 2) CAPS Take 1 capsule by mouth 2 (two) times daily  before lunch and supper.   nitroGLYCERIN (NITROSTAT) 0.4 MG SL tablet Place 1 tablet (0.4 mg total) under the tongue every 5 (five) minutes as needed.   Propylene Glycol (SYSTANE COMPLETE OP) Apply 1 drop to eye 2 (two) times daily.     Allergies:   Sulfonamide derivatives   Social History   Socioeconomic History   Marital status: Married    Spouse name: Not on file   Number of children: 1   Years of education: 12   Highest education level: Not on file  Occupational History   Occupation: Herbalist    Comment: retired   Tobacco Use   Smoking status: Former    Types: Cigarettes    Quit date: 10/23/1989    Years since quitting: 32.7   Smokeless tobacco:  Never  Vaping Use   Vaping Use: Never used  Substance and Sexual Activity   Alcohol use: Yes    Alcohol/week: 0.0 standard drinks of alcohol    Comment: rare glass of wine - less than once a year   Drug use: No   Sexual activity: Not Currently  Other Topics Concern   Not on file  Social History Narrative   Married 1971. 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 [employer] 6 years.       ACP: HCPOA - wife. Yes for acute CPR, no for prolonged mechanical ventilatory support, no for prolonged artificial nutrition or other heroic measures leaving him in an incapacitated state.    Social Determinants of Health   Financial Resource Strain: Low Risk  (01/27/2022)   Overall Financial Resource Strain (CARDIA)    Difficulty of Paying Living Expenses: Not hard at all  Food Insecurity: No Food Insecurity (01/27/2022)   Hunger Vital Sign    Worried About Running Out of Food in the Last Year: Never true    Ran Out of Food in the Last Year: Never true  Transportation Needs: No Transportation Needs (01/27/2022)   PRAPARE - Hydrologist (Medical): No    Lack of Transportation (Non-Medical): No  Physical Activity: Sufficiently Active (01/27/2022)   Exercise Vital Sign    Days of Exercise per Week: 2 days    Minutes of Exercise per Session: 150+ min  Stress: No Stress Concern Present (01/27/2022)   [place]    Feeling of Stress : Not at all  Social Connections: Moderately Integrated (01/27/2022)   Social Connection and Isolation Panel [NHANES]    Frequency of Communication with Friends and Family: More than three times a week    Frequency of Social Gatherings with Friends and Family: More than three times a week    Attends Religious Services: More than 4 times per year    Active Member of  [employer] or Organizations: No    Attends Music therapist: Never    Marital Status: Married     Family History: The patient's family history includes Arthritis in his paternal grandmother; Breast cancer in his mother; Dementia in his mother; Diabetes in his daughter and maternal uncle; Emphysema in his maternal grandfather; Heart disease in his father and mother; Hepatitis C in his brother; Hypertension in his father and mother; Hypothyroidism in his mother; Stroke in his maternal grandmother and mother. There is no history of Colon cancer, Esophageal  cancer, Stomach cancer, or Rectal cancer.  ROS:   Please see the history of present illness.     All other systems reviewed and are negative.  EKGs/Labs/Other Studies Reviewed:    The following studies were reviewed today:   EKG:   06/20/2022: Normal sinus rhythm, rate 61, no ST abnormality 07/15/22: NSR, rate 59  Recent Labs: 04/16/2022: ALT 9; B Natriuretic Peptide 402.8; Magnesium 2.1 06/15/2022: Hemoglobin 13.1; Platelets 129.0; TSH 1.15 06/20/2022: BUN 19; Creatinine, Ser 1.05; Potassium 4.5; Sodium 140  Recent Lipid Panel    Component Value Date/Time   CHOL 103 12/16/2021 1031   TRIG 72.0 12/16/2021 1031   TRIG 58 09/01/2006 0749   HDL 47.80 12/16/2021 1031   CHOLHDL 2 12/16/2021 1031   VLDL 14.4 12/16/2021 1031   LDLCALC 41 12/16/2021 1031    Physical Exam:    VS:  BP 122/64 (BP Location: Left Arm, Patient Position: Sitting, Cuff Size: Normal)   Pulse (!) 55   Ht 5' 9.5" (1.765 m)   Wt 192 lb 9.6 oz (87.4 kg)   SpO2 96%   BMI 28.03 kg/m     Wt Readings from Last 3 Encounters:  07/15/22 192 lb 9.6 oz (87.4 kg)  06/20/22 191 lb 6.4 oz (86.8 kg)  06/15/22 191 lb 3.2 oz (86.7 kg)     GEN:  Well nourished, well developed in no acute distress HEENT: Normal NECK: No JVD; No carotid bruits LYMPHATICS: No lymphadenopathy CARDIAC: RRR, no murmurs, rubs, gallops RESPIRATORY:  Clear to auscultation without rales,  wheezing or rhonchi  ABDOMEN: Soft, non-tender, non-distended MUSCULOSKELETAL:  No edema; No deformity  SKIN: Warm and dry NEUROLOGIC:  Alert and oriented x 3 PSYCHIATRIC:  Normal affect   ASSESSMENT:    1. Coronary artery disease of native artery of native heart with stable angina pectoris (HCC)   2. Paroxysmal atrial fibrillation ([place])   3. Essential hypertension   4. Hyperlipidemia, unspecified hyperlipidemia type     PLAN:    CAD:  He reports he has been having intermittent chest pain.  Atypical in description.  Also with dyspnea on exertion.  Echocardiogram 05/02/2022 showed normal biventricular function, no significant valvular disease.  Coronary CTA on 07/11/2022 showed severe stenosis in mid to distal LAD (CT FFR 0.64), calcium score 610 (72nd percentile). -Start aspirin 81 mg daily -Continue atorvastatin -As needed sublingual nitroglycerin -Plan LHC.  Risks and benefits of cardiac catheterization have been discussed with the patient.  These include bleeding, infection, kidney damage, stroke, heart attack, death.  The patient understands these risks and is willing to proceed.  Paroxysmal atrial fibrillation: He was diagnosed with A-fib at ED visit 04/16/2022.  CHA2DS2-VASc score 3, was started on Eliquis 5 mg twice daily as well as diltiazem 30 mg as needed for palpitations/tachycardia.  Echo 05/02/22 showed normal biventricular function, no significant valvular disease.  -Continue Eliquis 5 mg twice daily -Continue diltiazem 30 mg as needed -Zio patch x 7 days 06/20/2022 showed no A-fib, 49 episodes of SVT with longest lasting 12 seconds with average rate 132 bpm.  Hypertension: On lisinopril 20 mg daily.  Appears controlled  Hyperlipidemia: On atorvastatin 20 mg daily.  LDL 41 on 12/16/2021   RTC in 1 month    Medication Adjustments/Labs and Tests Ordered: Current medicines are reviewed at length with the patient today.  Concerns regarding medicines are outlined above.  Orders  Placed This Encounter  Procedures   Basic metabolic panel   CBC   EKG 12-Lead  Meds ordered this encounter  Medications   aspirin EC 81 MG tablet    Sig: Take 1 tablet (81 mg total) by mouth daily. Swallow whole.    Dispense:  90 tablet    Refill:  3   nitroGLYCERIN (NITROSTAT) 0.4 MG SL tablet    Sig: Place 1 tablet (0.4 mg total) under the tongue every 5 (five) minutes as needed.    Dispense:  25 tablet    Refill:  3    Patient Instructions  START aspirin 81 mg daily Take sublingual nitroglycerin AS NEEDED for chest pain   [place] A DEPT OF [place] [place] [id number] [place] [address] Dept: [phone] Loc: [place]  07/15/2022  You are scheduled for a Cardiac Catheterization on Friday, September 8 with Dr. Daneen Schick.  1. Please arrive at the [facility] (Main Entrance A) at [facility]: [address] [place], [place] [address] at 7:00 AM (This time is two hours before your procedure to ensure your preparation). Free valet parking service is available.   Special note: Every effort is made to have your procedure done on time. Please understand that emergencies sometimes delay scheduled procedures.  2. Diet: Do not eat solid foods after midnight.  The patient may have clear liquids until 5am upon the day of the procedure.  3. Labs: Today in office (BMET, CBC)  4. Medication instructions in preparation for your procedure:   Contrast Allergy: No  Hold Eliquis for 2 days prior (No Eliquis Wednesday or Thursday)  On the morning of your procedure, take your Aspirin and any morning medicines NOT listed above.  You may use sips of water.  5. Plan for one night stay--bring personal belongings. 6. Bring a current list of your medications and current insurance cards. 7. You MUST have a responsible person to  drive you home. 8. Someone MUST be with you the first 24 hours after you arrive home or your discharge will be delayed. 9. Please wear clothes that are easy to get on and off and wear slip-on shoes.  Thank you for allowing [place] to care for you!   -- [place] [facility]   Follow up in 1 month with Dr. Gardiner Rhyme   Signed, Donato Heinz, MD  07/15/2022 8:57 AM    [place]

## 2022-07-14 NOTE — H&P (View-Only) (Signed)
Cardiology Office Note:    Date:  07/15/2022   ID:  Edward Goodwin, DOB 02/24/1951, MRN [id number]  PCP:  [name], MD  Cardiologist:  None  Electrophysiologist:  None   Referring MD: [name], MD   Chief Complaint  Patient presents with   Coronary Artery Disease    History of Present Illness:    Edward Goodwin is a 71 y.o. male with a hx of paroxysmal atrial fibrillation, ulcerative colitis, hypertension, psoriasis who presents for follow-up.  He was referred by Malka So, PA for evaluation of atrial fibrillation.  He was diagnosed with A-fib and had ED visit 04/16/2022.  CHA2DS2-VASc score 3, was started on Eliquis 5 mg twice daily as well as diltiazem 30 mg as needed for palpitations/tachycardia.  He reports that he was dehydrated when he went into A-fib.  He felt tired but did not note any palpitations.  His wife checked his pulse and noted it was fast and irregular.  He reports occasional lightheadedness but denies any syncope.  Does report he has occasional chest pain.  Describes tightness in center of his chest.  Has not noted relationship with exertion.  He plays golf twice per week for exercise.  No lower extremity edema.  He is taking Eliquis, denies any bleeding issues.  Smoked for 20 years, quit in his early 92s.  Thinks father had MI in 21s.  Echocardiogram 05/02/2022 showed normal biventricular function, no significant valvular disease.  Coronary CTA on 07/11/2022 showed severe stenosis in mid to distal LAD (CT FFR 0.64), calcium score 610 (72nd percentile).  Since last clinic visit, he reports has been doing okay.  No chest pain over the last month.  Does report some dyspnea with walking up hills when he golfs.  Also with some lightheadedness with standing, denies any syncope.  He is taking Eliquis, denies any bleeding issues.  Past Medical History:  Diagnosis Date   Chicken pox    Colitis, ulcerative ([place])    Hypertension    Macular degeneration, bilateral  07/2012   areds 2    Psoriasis     Past Surgical History:  Procedure Laterality Date   ACNE CYST REMOVAL     CATARACT EXTRACTION W/ INTRAOCULAR LENS IMPLANT  10/2014   bilat   COLONOSCOPY     FOOT SURGERY  1988   SHOULDER ARTHROSCOPY  2007   Right   TONSILLECTOMY      Current Medications: Current Meds  Medication Sig   Acetaminophen (TYLENOL PO) Take 1 tablet by mouth as needed. Taking Tylenol Arthritis   apixaban (ELIQUIS) 5 MG TABS tablet Take 1 tablet (5 mg total) by mouth 2 (two) times daily.   Ascorbic Acid (VITAMIN C PO) Take 1 tablet by mouth daily. 1000 mg   aspirin EC 81 MG tablet Take 1 tablet (81 mg total) by mouth daily. Swallow whole.   atorvastatin (LIPITOR) 20 MG tablet TAKE 1 TABLET BY MOUTH EVERY DAY   clobetasol ointment (TEMOVATE) 0.05 % Apply topically 2 (two) times daily as needed.   diphenhydramine-acetaminophen (TYLENOL PM) 25-500 MG TABS tablet Take 1 tablet by mouth at bedtime as needed.   Faricimab-svoa (VABYSMO IZ) 650 mg by Intravitreal route every 30 (thirty) days.   L-Lysine 1000 MG TABS Take 500 mg by mouth daily.    lisinopril (ZESTRIL) 20 MG tablet TAKE 1 TABLET BY MOUTH EVERY DAY   Multiple Vitamins-Minerals (PRESERVISION AREDS 2) CAPS Take 1 capsule by mouth 2 (two) times daily  before lunch and supper.   nitroGLYCERIN (NITROSTAT) 0.4 MG SL tablet Place 1 tablet (0.4 mg total) under the tongue every 5 (five) minutes as needed.   Propylene Glycol (SYSTANE COMPLETE OP) Apply 1 drop to eye 2 (two) times daily.     Allergies:   Sulfonamide derivatives   Social History   Socioeconomic History   Marital status: Married    Spouse name: Not on file   Number of children: 1   Years of education: 12   Highest education level: Not on file  Occupational History   Occupation: Herbalist    Comment: retired   Tobacco Use   Smoking status: Former    Types: Cigarettes    Quit date: 10/23/1989    Years since quitting: 32.7   Smokeless tobacco:  Never  Vaping Use   Vaping Use: Never used  Substance and Sexual Activity   Alcohol use: Yes    Alcohol/week: 0.0 standard drinks of alcohol    Comment: rare glass of wine - less than once a year   Drug use: No   Sexual activity: Not Currently  Other Topics Concern   Not on file  Social History Narrative   Married 1971. 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       ACP: HCPOA - wife. Yes for acute CPR, no for prolonged mechanical ventilatory support, no for prolonged artificial nutrition or other heroic measures leaving him in an incapacitated state.    Social Determinants of Health   Financial Resource Strain: Low Risk  (01/27/2022)   Overall Financial Resource Strain (CARDIA)    Difficulty of Paying Living Expenses: Not hard at all  Food Insecurity: No Food Insecurity (01/27/2022)   Hunger Vital Sign    Worried About Running Out of Food in the Last Year: Never true    Ran Out of Food in the Last Year: Never true  Transportation Needs: No Transportation Needs (01/27/2022)   PRAPARE - Hydrologist (Medical): No    Lack of Transportation (Non-Medical): No  Physical Activity: Sufficiently Active (01/27/2022)   Exercise Vital Sign    Days of Exercise per Week: 2 days    Minutes of Exercise per Session: 150+ min  Stress: No Stress Concern Present (01/27/2022)   [place]    Feeling of Stress : Not at all  Social Connections: Moderately Integrated (01/27/2022)   Social Connection and Isolation Panel [NHANES]    Frequency of Communication with Friends and Family: More than three times a week    Frequency of Social Gatherings with Friends and Family: More than three times a week    Attends Religious Services: More than 4 times per year    Active Member of  [employer] or Organizations: No    Attends Music therapist: Never    Marital Status: Married     Family History: The patient's family history includes Arthritis in his paternal grandmother; Breast cancer in his mother; Dementia in his mother; Diabetes in his daughter and maternal uncle; Emphysema in his maternal grandfather; Heart disease in his father and mother; Hepatitis C in his brother; Hypertension in his father and mother; Hypothyroidism in his mother; Stroke in his maternal grandmother and mother. There is no history of Colon cancer, Esophageal  cancer, Stomach cancer, or Rectal cancer.  ROS:   Please see the history of present illness.     All other systems reviewed and are negative.  EKGs/Labs/Other Studies Reviewed:    The following studies were reviewed today:   EKG:   06/20/2022: Normal sinus rhythm, rate 61, no ST abnormality 07/15/22: NSR, rate 59  Recent Labs: 04/16/2022: ALT 9; B Natriuretic Peptide 402.8; Magnesium 2.1 06/15/2022: Hemoglobin 13.1; Platelets 129.0; TSH 1.15 06/20/2022: BUN 19; Creatinine, Ser 1.05; Potassium 4.5; Sodium 140  Recent Lipid Panel    Component Value Date/Time   CHOL 103 12/16/2021 1031   TRIG 72.0 12/16/2021 1031   TRIG 58 09/01/2006 0749   HDL 47.80 12/16/2021 1031   CHOLHDL 2 12/16/2021 1031   VLDL 14.4 12/16/2021 1031   LDLCALC 41 12/16/2021 1031    Physical Exam:    VS:  BP 122/64 (BP Location: Left Arm, Patient Position: Sitting, Cuff Size: Normal)   Pulse (!) 55   Ht 5' 9.5" (1.765 m)   Wt 192 lb 9.6 oz (87.4 kg)   SpO2 96%   BMI 28.03 kg/m     Wt Readings from Last 3 Encounters:  07/15/22 192 lb 9.6 oz (87.4 kg)  06/20/22 191 lb 6.4 oz (86.8 kg)  06/15/22 191 lb 3.2 oz (86.7 kg)     GEN:  Well nourished, well developed in no acute distress HEENT: Normal NECK: No JVD; No carotid bruits LYMPHATICS: No lymphadenopathy CARDIAC: RRR, no murmurs, rubs, gallops RESPIRATORY:  Clear to auscultation without rales,  wheezing or rhonchi  ABDOMEN: Soft, non-tender, non-distended MUSCULOSKELETAL:  No edema; No deformity  SKIN: Warm and dry NEUROLOGIC:  Alert and oriented x 3 PSYCHIATRIC:  Normal affect   ASSESSMENT:    1. Coronary artery disease of native artery of native heart with stable angina pectoris (HCC)   2. Paroxysmal atrial fibrillation ([place])   3. Essential hypertension   4. Hyperlipidemia, unspecified hyperlipidemia type     PLAN:    CAD:  He reports he has been having intermittent chest pain.  Atypical in description.  Also with dyspnea on exertion.  Echocardiogram 05/02/2022 showed normal biventricular function, no significant valvular disease.  Coronary CTA on 07/11/2022 showed severe stenosis in mid to distal LAD (CT FFR 0.64), calcium score 610 (72nd percentile). -Start aspirin 81 mg daily -Continue atorvastatin -As needed sublingual nitroglycerin -Plan LHC.  Risks and benefits of cardiac catheterization have been discussed with the patient.  These include bleeding, infection, kidney damage, stroke, heart attack, death.  The patient understands these risks and is willing to proceed.  Paroxysmal atrial fibrillation: He was diagnosed with A-fib at ED visit 04/16/2022.  CHA2DS2-VASc score 3, was started on Eliquis 5 mg twice daily as well as diltiazem 30 mg as needed for palpitations/tachycardia.  Echo 05/02/22 showed normal biventricular function, no significant valvular disease.  -Continue Eliquis 5 mg twice daily -Continue diltiazem 30 mg as needed -Zio patch x 7 days 06/20/2022 showed no A-fib, 49 episodes of SVT with longest lasting 12 seconds with average rate 132 bpm.  Hypertension: On lisinopril 20 mg daily.  Appears controlled  Hyperlipidemia: On atorvastatin 20 mg daily.  LDL 41 on 12/16/2021   RTC in 1 month    Medication Adjustments/Labs and Tests Ordered: Current medicines are reviewed at length with the patient today.  Concerns regarding medicines are outlined above.  Orders  Placed This Encounter  Procedures   Basic metabolic panel   CBC   EKG 12-Lead  Meds ordered this encounter  Medications   aspirin EC 81 MG tablet    Sig: Take 1 tablet (81 mg total) by mouth daily. Swallow whole.    Dispense:  90 tablet    Refill:  3   nitroGLYCERIN (NITROSTAT) 0.4 MG SL tablet    Sig: Place 1 tablet (0.4 mg total) under the tongue every 5 (five) minutes as needed.    Dispense:  25 tablet    Refill:  3    Patient Instructions  START aspirin 81 mg daily Take sublingual nitroglycerin AS NEEDED for chest pain   [place] A DEPT OF [place]. [facility] [place] [id number] [place] [address] Dept: [phone] Loc: [place]  07/15/2022  You are scheduled for a Cardiac Catheterization on Friday, September 8 with Dr. Daneen Schick.  1. Please arrive at the [facility] (Main Entrance A) at [facility]: [address] [place], [place] [address] at 7:00 AM (This time is two hours before your procedure to ensure your preparation). Free valet parking service is available.   Special note: Every effort is made to have your procedure done on time. Please understand that emergencies sometimes delay scheduled procedures.  2. Diet: Do not eat solid foods after midnight.  The patient may have clear liquids until 5am upon the day of the procedure.  3. Labs: Today in office (BMET, CBC)  4. Medication instructions in preparation for your procedure:   Contrast Allergy: No  Hold Eliquis for 2 days prior (No Eliquis Wednesday or Thursday)  On the morning of your procedure, take your Aspirin and any morning medicines NOT listed above.  You may use sips of water.  5. Plan for one night stay--bring personal belongings. 6. Bring a current list of your medications and current insurance cards. 7. You MUST have a responsible person to  drive you home. 8. Someone MUST be with you the first 24 hours after you arrive home or your discharge will be delayed. 9. Please wear clothes that are easy to get on and off and wear slip-on shoes.  Thank you for allowing [place] to care for you!   -- [place] [facility]   Follow up in 1 month with Dr. Gardiner Rhyme   Signed, Donato Heinz, MD  07/15/2022 8:57 AM    [place]

## 2022-07-15 ENCOUNTER — Encounter: Payer: Self-pay | Admitting: Cardiology

## 2022-07-15 ENCOUNTER — Ambulatory Visit: Payer: PPO | Attending: Cardiology | Admitting: Cardiology

## 2022-07-15 VITALS — BP 122/64 | HR 55 | Ht 69.5 in | Wt 192.6 lb

## 2022-07-15 DIAGNOSIS — I480 Paroxysmal atrial fibrillation: Secondary | ICD-10-CM

## 2022-07-15 DIAGNOSIS — I10 Essential (primary) hypertension: Secondary | ICD-10-CM

## 2022-07-15 DIAGNOSIS — I25118 Atherosclerotic heart disease of native coronary artery with other forms of angina pectoris: Secondary | ICD-10-CM

## 2022-07-15 DIAGNOSIS — E785 Hyperlipidemia, unspecified: Secondary | ICD-10-CM

## 2022-07-15 MED ORDER — ASPIRIN 81 MG PO TBEC: 81.0000 mg | DELAYED_RELEASE_TABLET | Freq: Every day | ORAL | 3 refills | Status: DC

## 2022-07-15 MED ORDER — NITROGLYCERIN 0.4 MG SL SUBL: 0.4000 mg | SUBLINGUAL_TABLET | SUBLINGUAL | 3 refills | Status: DC | PRN

## 2022-07-15 NOTE — Patient Instructions (Signed)
START aspirin 81 mg daily Take sublingual nitroglycerin AS NEEDED for chest pain   [place] A DEPT OF [place] A DEPT OF [place]. [facility] [place] [id number] [place] [address] Dept: [phone] Loc: [place]  07/15/2022  You are scheduled for a Cardiac Catheterization on Friday, September 8 with Dr. Daneen Schick.  1. Please arrive at the [facility] (Main Entrance A) at [facility]: [address] [place], [place] [address] at 7:00 AM (This time is two hours before your procedure to ensure your preparation). Free valet parking service is available.   Special note: Every effort is made to have your procedure done on time. Please understand that emergencies sometimes delay scheduled procedures.  2. Diet: Do not eat solid foods after midnight.  The patient may have clear liquids until 5am upon the day of the procedure.  3. Labs: Today in office (BMET, CBC)  4. Medication instructions in preparation for your procedure:   Contrast Allergy: No  Hold Eliquis for 2 days prior (No Eliquis Wednesday or Thursday)  On the morning of your procedure, take your Aspirin and any morning medicines NOT listed above.  You may use sips of water.  5. Plan for one night stay--bring personal belongings. 6. Bring a current list of your medications and current insurance cards. 7. You MUST have a responsible person to drive you home. 8. Someone MUST be with you the first 24 hours after you arrive home or your discharge will be delayed. 9. Please wear clothes that are easy to get on and off and wear slip-on shoes.  Thank you for allowing [place] to care for you!   -- [facility]   Follow up in 1 month with Dr. Gardiner Rhyme

## 2022-07-16 LAB — BASIC METABOLIC PANEL WITH GFR
BUN/Creatinine Ratio: 18 (ref 10–24)
BUN: 18 mg/dL (ref 8–27)
CO2: 25 mmol/L (ref 20–29)
Calcium: 9.4 mg/dL (ref 8.6–10.2)
Chloride: 102 mmol/L (ref 96–106)
Creatinine, Ser: 0.98 mg/dL (ref 0.76–1.27)
Glucose: 78 mg/dL (ref 70–99)
Potassium: 4.6 mmol/L (ref 3.5–5.2)
Sodium: 139 mmol/L (ref 134–144)
eGFR: 81 mL/min/1.73

## 2022-07-16 LAB — CBC
Hematocrit: 42.3 % (ref 37.5–51.0)
Hemoglobin: 14.2 g/dL (ref 13.0–17.7)
MCH: 28.9 pg (ref 26.6–33.0)
MCHC: 33.6 g/dL (ref 31.5–35.7)
MCV: 86 fL (ref 79–97)
Platelets: 144 10*3/uL — ABNORMAL LOW (ref 150–450)
RBC: 4.91 x10E6/uL (ref 4.14–5.80)
RDW: 14.6 % (ref 11.6–15.4)
WBC: 4 10*3/uL (ref 3.4–10.8)

## 2022-07-19 ENCOUNTER — Ambulatory Visit: Payer: PPO | Admitting: Physical Medicine and Rehabilitation

## 2022-07-19 ENCOUNTER — Ambulatory Visit: Payer: Self-pay

## 2022-07-19 ENCOUNTER — Encounter: Payer: Self-pay | Admitting: Physical Medicine and Rehabilitation

## 2022-07-19 VITALS — BP 116/76 | HR 60

## 2022-07-19 DIAGNOSIS — M5412 Radiculopathy, cervical region: Secondary | ICD-10-CM

## 2022-07-19 MED ORDER — METHYLPREDNISOLONE ACETATE 80 MG/ML IJ SUSP
40.0000 mg | Freq: Once | INTRAMUSCULAR | Status: AC
  Administered 2022-07-19: 40 mg

## 2022-07-19 NOTE — Patient Instructions (Signed)

## 2022-07-19 NOTE — Progress Notes (Unsigned)
Pt state neck pain that travels to his right shoulder. Pt state any movement with his head or neck makes the pain worse. Pt state he takes over the counter pain meds and uses heat and ice to help ease his pain.   Numeric Pain Rating Scale and Functional Assessment Average Pain 4   In the last MONTH (on 0-10 scale) has pain interfered with the following?  1. General activity like being  able to carry out your everyday physical activities such as walking, climbing stairs, carrying groceries, or moving a chair?  Rating(10)   +Driver, +BT pt stop BT two days ago, -Dye Allergies.

## 2022-07-20 NOTE — Procedures (Signed)
Cervical Epidural Steroid Injection - Interlaminar Approach with Fluoroscopic Guidance  Patient: Edward Goodwin      Date of Birth: 1951/03/22 MRN: [id number] PCP: [name], MD      Visit Date: 07/19/2022   Universal Protocol:    Date/Time: 07/21/2311:14 PM  Consent Given By: the patient  Position: PRONE  Additional Comments: Vital signs were monitored before and after the procedure. Patient was prepped and draped in the usual sterile fashion. The correct patient, procedure, and site was verified.   Injection Procedure Details:   Procedure diagnoses: Cervical radiculopathy [M54.12]    Meds Administered:  Meds ordered this encounter  Medications   methylPREDNISolone acetate (DEPO-MEDROL) injection 40 mg     Laterality: Right  Location/Site: C7-T1  Needle: 3.5 in., 20 ga. Tuohy  Needle Placement: Paramedian epidural space  Findings:  -Comments: Excellent flow of contrast into the epidural space.  Procedure Details: Using a paramedian approach from the side mentioned above, the region overlying the inferior lamina was localized under fluoroscopic visualization and the soft tissues overlying this structure were infiltrated with 4 ml. of 1% Lidocaine without Epinephrine. A # 20 gauge, Tuohy needle was inserted into the epidural space using a paramedian approach.  The epidural space was localized using loss of resistance along with contralateral oblique bi-planar fluoroscopic views.  After negative aspirate for air, blood, and CSF, a 2 ml. volume of Isovue-250 was injected into the epidural space and the flow of contrast was observed. Radiographs were obtained for documentation purposes.   The injectate was administered into the level noted above.  Additional Comments:  The patient tolerated the procedure well Dressing: 2 x 2 sterile gauze and Band-Aid    Post-procedure details: Patient was observed during the procedure. Post-procedure instructions were  reviewed.  Patient left the clinic in stable condition.

## 2022-07-20 NOTE — Progress Notes (Signed)
Edward Goodwin - 71 y.o. male MRN [id number]  Date of birth: [birth year]  Office Visit Note: Visit Date: 07/19/2022 PCP: [name], MD Referred by: [name], MD  Subjective: Chief Complaint  Patient presents with   Neck - Pain   Right Shoulder - Pain   HPI:  Edward Goodwin is a 71 y.o. male who comes in today at the request of Barnet Pall, FNP for planned Right C7-T1 Cervical Interlaminar epidural steroid injection with fluoroscopic guidance.  The patient has failed conservative care including home exercise, medications, time and activity modification.  This injection will be diagnostic and hopefully therapeutic.  Please see requesting physician notes for further details and justification.  Has been off Eliquis for 2 days.  Does have a heart cath procedure coming up very soon.  He reports he may be on Eliquis and Plavix after that.  I did tell him that we would likely not be able to do epidural type injections if a stent was placed as they are usually on anticoagulation for up to a year.  Hopefully this injection will help him today.   ROS Otherwise per HPI.  Assessment & Plan: Visit Diagnoses:    ICD-10-CM   1. Cervical radiculopathy  M54.12 XR C-ARM NO REPORT    Epidural Steroid injection    methylPREDNISolone acetate (DEPO-MEDROL) injection 40 mg      Plan: No additional findings.   Meds & Orders:  Meds ordered this encounter  Medications   methylPREDNISolone acetate (DEPO-MEDROL) injection 40 mg    Orders Placed This Encounter  Procedures   XR C-ARM NO REPORT   Epidural Steroid injection    Follow-up: Return if symptoms worsen or fail to improve.   Procedures: No procedures performed  Cervical Epidural Steroid Injection - Interlaminar Approach with Fluoroscopic Guidance  Patient: Edward Goodwin      Date of Birth: 02-15-1951 MRN: [id number] PCP: [name], MD      Visit Date: 07/19/2022   Universal Protocol:    Date/Time: 07/21/2311:14  PM  Consent Given By: the patient  Position: PRONE  Additional Comments: Vital signs were monitored before and after the procedure. Patient was prepped and draped in the usual sterile fashion. The correct patient, procedure, and site was verified.   Injection Procedure Details:   Procedure diagnoses: Cervical radiculopathy [M54.12]    Meds Administered:  Meds ordered this encounter  Medications   methylPREDNISolone acetate (DEPO-MEDROL) injection 40 mg     Laterality: Right  Location/Site: C7-T1  Needle: 3.5 in., 20 ga. Tuohy  Needle Placement: Paramedian epidural space  Findings:  -Comments: Excellent flow of contrast into the epidural space.  Procedure Details: Using a paramedian approach from the side mentioned above, the region overlying the inferior lamina was localized under fluoroscopic visualization and the soft tissues overlying this structure were infiltrated with 4 ml. of 1% Lidocaine without Epinephrine. A # 20 gauge, Tuohy needle was inserted into the epidural space using a paramedian approach.  The epidural space was localized using loss of resistance along with contralateral oblique bi-planar fluoroscopic views.  After negative aspirate for air, blood, and CSF, a 2 ml. volume of Isovue-250 was injected into the epidural space and the flow of contrast was observed. Radiographs were obtained for documentation purposes.   The injectate was administered into the level noted above.  Additional Comments:  The patient tolerated the procedure well Dressing: 2 x 2 sterile gauze and Band-Aid    Post-procedure details:  Patient was observed during the procedure. Post-procedure instructions were reviewed.  Patient left the clinic in stable condition.   Clinical History: EXAM: MRI CERVICAL SPINE WITHOUT CONTRAST   TECHNIQUE: Multiplanar, multisequence MR imaging of the cervical spine was performed. No intravenous contrast was administered.   COMPARISON:   None Available.   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 Right C2-3 facet edema. No fracture or acute abnormality.   Cord: Normal signal and morphology.   Posterior Fossa, vertebral arteries, paraspinal tissues: Negative.   Disc levels:   C1-2: Unremarkable.   C2-3: Right facet arthrosis and minimal disc bulge. There is no spinal canal stenosis. No neural foraminal stenosis.   C3-4: Partial congenital fusion across the disc space. There is no spinal canal stenosis. No neural foraminal stenosis.   C4-5: Small disc bulge with minimal uncovertebral spurring. Mild right facet hypertrophy. There is no spinal canal stenosis. No neural foraminal stenosis.   C5-6: Disc space narrowing with small bulge and uncovertebral spurring, left-greater-than-right. There is no spinal canal stenosis. Mild left neural foraminal stenosis.   C6-7: Small disc bulge with uncovertebral spurring. There is no spinal canal stenosis. No neural foraminal stenosis.   C7-T1: Mild disc degeneration. There is no spinal canal stenosis. No neural foraminal stenosis.   IMPRESSION: 1. Right C2-3 facet edema may be a source of local neck pain. 2. Mild left C5-6 neural foraminal stenosis secondary to combination of disc bulge and uncovertebral spurring. 3. No spinal canal stenosis. 4. 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     Electronically Signed   By: Ulyses Jarred M.D.   On: 03/21/2022 14:22     Objective:  VS:  HT:    WT:   BMI:     BP:116/76  HR:60bpm  TEMP: ( )  RESP:  Physical Exam Vitals and nursing note reviewed.  Constitutional:      General: He is not in acute distress.    Appearance: Normal appearance. He is not ill-appearing.  HENT:     Head: Normocephalic and atraumatic.     Right Ear: External ear normal.     Left Ear: External ear normal.  Eyes:     Extraocular Movements: Extraocular movements intact.   Cardiovascular:     Rate and Rhythm: Normal rate.     Pulses: Normal pulses.  Abdominal:     General: There is no distension.     Palpations: Abdomen is soft.  Musculoskeletal:        General: No signs of injury.     Cervical back: Neck supple. Tenderness present. No rigidity.     Right lower leg: No edema.     Left lower leg: No edema.     Comments: Patient has good strength in the upper extremities with 5 out of 5 strength in wrist extension long finger flexion APB.  No intrinsic hand muscle atrophy.  Negative Hoffmann's test.  Lymphadenopathy:     Cervical: No cervical adenopathy.  Skin:    Findings: No erythema or rash.  Neurological:     General: No focal deficit present.     Mental Status: He is alert and oriented to person, place, and time.     Sensory: No sensory deficit.     Motor: No weakness or abnormal muscle tone.     Coordination: Coordination normal.  Psychiatric:        Mood and Affect: Mood normal.        Behavior:  Behavior normal.      Imaging: No results found.

## 2022-07-21 ENCOUNTER — Telehealth: Payer: Self-pay | Admitting: *Deleted

## 2022-07-21 NOTE — Telephone Encounter (Signed)
Cardiac Catheterization scheduled at [facility] for: Friday July 22, 2022 9 AM Arrival time and place: [facility] Main Entrance A at: 7 AM  Medication instructions: -Hold:  Eliquis-pt reports Eliquis on hold since 07/17/22 until post procedure (neck injection 07/19/22) -Except hold medications usual morning medications can be taken with sips of water including aspirin 81 mg.  Confirmed patient has responsible adult to drive home post procedure and be with patient first 24 hours after arriving home.  Patient reports no new symptoms concerning for COVID-19 in the past 10 days.  Reviewed procedure instructions with patient.

## 2022-07-21 NOTE — H&P (Signed)
Angina pectoris, relatively stable. Coronary CTA demonstrated small diffusely diseased mid to distal LAD stenosis that is hemodynamically significant.  Difficult to tell from images if stenting will be possible. BUN and creatinine okay. Has history of A-fib and is on Eliquis therapy.  If stent, would need triple drug therapy for 2 to 4 weeks and then drop aspirin.

## 2022-07-22 ENCOUNTER — Ambulatory Visit (HOSPITAL_COMMUNITY)
Admission: RE | Admit: 2022-07-22 | Discharge: 2022-07-22 | Disposition: A | Discharge status: Home / Self Care | Payer: PPO | Attending: Interventional Cardiology | Admitting: Interventional Cardiology

## 2022-07-22 ENCOUNTER — Other Ambulatory Visit: Payer: Self-pay

## 2022-07-22 ENCOUNTER — Encounter (HOSPITAL_COMMUNITY)
Admission: RE | Disposition: A | Discharge status: Home / Self Care | Payer: Self-pay | Source: Home / Self Care | Attending: Interventional Cardiology

## 2022-07-22 DIAGNOSIS — I10 Essential (primary) hypertension: Secondary | ICD-10-CM | POA: Insufficient documentation

## 2022-07-22 DIAGNOSIS — I2510 Atherosclerotic heart disease of native coronary artery without angina pectoris: Secondary | ICD-10-CM | POA: Diagnosis not present

## 2022-07-22 DIAGNOSIS — I480 Paroxysmal atrial fibrillation: Secondary | ICD-10-CM | POA: Diagnosis not present

## 2022-07-22 DIAGNOSIS — R0609 Other forms of dyspnea: Secondary | ICD-10-CM | POA: Diagnosis not present

## 2022-07-22 DIAGNOSIS — E785 Hyperlipidemia, unspecified: Secondary | ICD-10-CM | POA: Diagnosis not present

## 2022-07-22 DIAGNOSIS — I2584 Coronary atherosclerosis due to calcified coronary lesion: Secondary | ICD-10-CM | POA: Insufficient documentation

## 2022-07-22 DIAGNOSIS — I25118 Atherosclerotic heart disease of native coronary artery with other forms of angina pectoris: Secondary | ICD-10-CM | POA: Insufficient documentation

## 2022-07-22 DIAGNOSIS — Z79899 Other long term (current) drug therapy: Secondary | ICD-10-CM | POA: Diagnosis not present

## 2022-07-22 DIAGNOSIS — Z7901 Long term (current) use of anticoagulants: Secondary | ICD-10-CM | POA: Diagnosis not present

## 2022-07-22 DIAGNOSIS — I714 Abdominal aortic aneurysm, without rupture, unspecified: Secondary | ICD-10-CM | POA: Diagnosis present

## 2022-07-22 HISTORY — PX: LEFT HEART CATH AND CORONARY ANGIOGRAPHY: CATH118249

## 2022-07-22 SURGERY — LEFT HEART CATH AND CORONARY ANGIOGRAPHY
Anesthesia: LOCAL

## 2022-07-22 MED ORDER — FENTANYL CITRATE (PF) 100 MCG/2ML IJ SOLN
INTRAMUSCULAR | Status: DC | PRN
  Administered 2022-07-22: 25 ug via INTRAVENOUS

## 2022-07-22 MED ORDER — ASPIRIN 81 MG PO CHEW: 81.0000 mg | CHEWABLE_TABLET | ORAL | Status: DC

## 2022-07-22 MED ORDER — SODIUM CHLORIDE 0.9 % IV SOLN
250.0000 mL | INTRAVENOUS | Status: DC | PRN
Start: 2022-07-22 — End: 2022-07-22

## 2022-07-22 MED ORDER — NITROGLYCERIN 1 MG/10 ML FOR IR/CATH LAB
INTRA_ARTERIAL | Status: DC | PRN
  Administered 2022-07-22: 200 ug via INTRACORONARY

## 2022-07-22 MED ORDER — MIDAZOLAM HCL 2 MG/2ML IJ SOLN
INTRAMUSCULAR | Status: AC
  Filled 2022-07-22: qty 2

## 2022-07-22 MED ORDER — LABETALOL HCL 5 MG/ML IV SOLN: 10.0000 mg | INTRAVENOUS | Status: DC | PRN

## 2022-07-22 MED ORDER — SODIUM CHLORIDE 0.9% FLUSH
3.0000 mL | INTRAVENOUS | Status: DC | PRN
Start: 2022-07-22 — End: 2022-07-22

## 2022-07-22 MED ORDER — SODIUM CHLORIDE 0.9% FLUSH: 3.0000 mL | Freq: Two times a day (BID) | INTRAVENOUS | Status: DC

## 2022-07-22 MED ORDER — LIDOCAINE HCL (PF) 1 % IJ SOLN
INTRAMUSCULAR | Status: DC | PRN
  Administered 2022-07-22: 2 mL via INTRADERMAL

## 2022-07-22 MED ORDER — HEPARIN (PORCINE) IN NACL 1000-0.9 UT/500ML-% IV SOLN
INTRAVENOUS | Status: AC
  Filled 2022-07-22: qty 1000

## 2022-07-22 MED ORDER — SODIUM CHLORIDE 0.9 % WEIGHT BASED INFUSION: 1.0000 mL/kg/h | INTRAVENOUS | Status: DC

## 2022-07-22 MED ORDER — HEPARIN (PORCINE) IN NACL 1000-0.9 UT/500ML-% IV SOLN
INTRAVENOUS | Status: DC | PRN
  Administered 2022-07-22 (×2): 500 mL

## 2022-07-22 MED ORDER — FENTANYL CITRATE (PF) 100 MCG/2ML IJ SOLN
INTRAMUSCULAR | Status: AC
  Filled 2022-07-22: qty 2

## 2022-07-22 MED ORDER — IOHEXOL 350 MG/ML SOLN
INTRAVENOUS | Status: DC | PRN
  Administered 2022-07-22: 80 mL via INTRA_ARTERIAL

## 2022-07-22 MED ORDER — SODIUM CHLORIDE 0.9 % IV SOLN: INTRAVENOUS | Status: DC

## 2022-07-22 MED ORDER — ACETAMINOPHEN 325 MG PO TABS: 650.0000 mg | ORAL_TABLET | ORAL | Status: DC | PRN

## 2022-07-22 MED ORDER — HYDRALAZINE HCL 20 MG/ML IJ SOLN: 10.0000 mg | INTRAMUSCULAR | Status: DC | PRN

## 2022-07-22 MED ORDER — VERAPAMIL HCL 2.5 MG/ML IV SOLN
INTRAVENOUS | Status: DC | PRN
  Administered 2022-07-22: 10 mL via INTRA_ARTERIAL

## 2022-07-22 MED ORDER — SODIUM CHLORIDE 0.9 % IV SOLN: 250.0000 mL | INTRAVENOUS | Status: DC | PRN

## 2022-07-22 MED ORDER — HEPARIN SODIUM (PORCINE) 1000 UNIT/ML IJ SOLN
INTRAMUSCULAR | Status: DC | PRN
  Administered 2022-07-22: 4500 [IU] via INTRAVENOUS

## 2022-07-22 MED ORDER — MIDAZOLAM HCL 2 MG/2ML IJ SOLN
INTRAMUSCULAR | Status: DC | PRN
  Administered 2022-07-22: 1 mg via INTRAVENOUS

## 2022-07-22 MED ORDER — LIDOCAINE HCL (PF) 1 % IJ SOLN
INTRAMUSCULAR | Status: AC
  Filled 2022-07-22: qty 30

## 2022-07-22 MED ORDER — NITROGLYCERIN 1 MG/10 ML FOR IR/CATH LAB
INTRA_ARTERIAL | Status: AC
  Filled 2022-07-22: qty 10

## 2022-07-22 MED ORDER — SODIUM CHLORIDE 0.9 % WEIGHT BASED INFUSION
3.0000 mL/kg/h | INTRAVENOUS | Status: AC
  Administered 2022-07-22: 3 mL/kg/h via INTRAVENOUS

## 2022-07-22 MED ORDER — VERAPAMIL HCL 2.5 MG/ML IV SOLN
INTRAVENOUS | Status: AC
Start: 2022-07-22 — End: ?
  Filled 2022-07-22: qty 2

## 2022-07-22 MED ORDER — SODIUM CHLORIDE 0.9% FLUSH: 3.0000 mL | INTRAVENOUS | Status: DC | PRN

## 2022-07-22 MED ORDER — HEPARIN SODIUM (PORCINE) 1000 UNIT/ML IJ SOLN
INTRAMUSCULAR | Status: AC
Start: 2022-07-22 — End: ?
  Filled 2022-07-22: qty 10

## 2022-07-22 MED ORDER — ONDANSETRON HCL 4 MG/2ML IJ SOLN: 4.0000 mg | Freq: Four times a day (QID) | INTRAMUSCULAR | Status: DC | PRN

## 2022-07-22 SURGICAL SUPPLY — 11 items
BAND CMPR LRG ZPHR (HEMOSTASIS) ×1
BAND ZEPHYR COMPRESS 30 LONG (HEMOSTASIS) IMPLANT
CATH 5FR JL3.5 JR4 ANG PIG MP (CATHETERS) IMPLANT
GLIDESHEATH SLEND A-KIT 6F 22G (SHEATH) IMPLANT
GUIDEWIRE INQWIRE 1.5J.035X260 (WIRE) IMPLANT
INQWIRE 1.5J .035X260CM (WIRE) ×1
KIT HEART LEFT (KITS) ×2 IMPLANT
PACK CARDIAC CATHETERIZATION (CUSTOM PROCEDURE TRAY) ×2 IMPLANT
SHEATH PROBE COVER 6X72 (BAG) IMPLANT
TRANSDUCER W/STOPCOCK (MISCELLANEOUS) ×2 IMPLANT
TUBING CIL FLEX 10 FLL-RA (TUBING) ×2 IMPLANT

## 2022-07-22 NOTE — Interval H&P Note (Signed)
Cath Lab Visit (complete for each Cath Lab visit)  Clinical Evaluation Leading to the Procedure:   ACS: No.  Non-ACS:    Anginal Classification: CCS Goodwin  Anti-ischemic medical therapy: Minimal Therapy (1 class of medications)  Non-Invasive Test Results: High-risk stress test findings: cardiac mortality >3%/year  Prior CABG: No previous CABG      History and Physical Interval Note:  07/22/2022 9:18 AM  Edward Goodwin  has presented today for surgery, with the diagnosis of chest pain, abnormal coronary CT.  The various methods of treatment have been discussed with the patient and family. After consideration of risks, benefits and other options for treatment, the patient has consented to  Procedure(s): LEFT HEART CATH AND CORONARY ANGIOGRAPHY (N/A) as a surgical intervention.  The patient's history has been reviewed, patient examined, no change in status, stable for surgery.  I have reviewed the patient's chart and labs.  Questions were answered to the patient's satisfaction.     Edward Goodwin

## 2022-07-22 NOTE — CV Procedure (Signed)
Mid segmental 85% stenosis in LAD within a circumferential region of dense calcification starting just beyond the takeoff of the large diagonal. 40 to 50% distal circumflex before the second obtuse marginal RCA is large and contains mid vessel 30 to 40% narrowing. LVEDP 7 mmHg.  We will speak with Dr. Gardiner Rhyme.  Patient has a relatively complex LAD that will require orbital atherectomy before stenting and could "jail" a large diagonal.  Patient is having minimal symptoms.  If PCI he will be on triple drug therapy for a while with increased risk of bleeding.  We will discuss with Dr. Nechama Guard before going further.  If the plan is to perform PCI we will start Plavix as an outpatient along with aspirin and set him up for a stand-alone LAD orbital atherectomy/stent procedure.

## 2022-07-25 ENCOUNTER — Encounter (HOSPITAL_COMMUNITY): Payer: Self-pay | Admitting: Interventional Cardiology

## 2022-08-02 DIAGNOSIS — H353211 Exudative age-related macular degeneration, right eye, with active choroidal neovascularization: Secondary | ICD-10-CM | POA: Diagnosis not present

## 2022-08-02 DIAGNOSIS — H353122 Nonexudative age-related macular degeneration, left eye, intermediate dry stage: Secondary | ICD-10-CM | POA: Diagnosis not present

## 2022-08-08 ENCOUNTER — Encounter: Payer: Self-pay | Admitting: *Deleted

## 2022-08-14 NOTE — Progress Notes (Unsigned)
Cardiology Office Note:    Date:  08/16/2022   ID:  Edward Goodwin, DOB Nov 21, 1950, MRN [id number]  PCP:  [name], MD  Cardiologist:  None  Electrophysiologist:  None   Referring MD: [name], MD   Chief Complaint  Patient presents with   Coronary Artery Disease    History of Present Illness:    Edward Goodwin is a 71 y.o. male with a hx of paroxysmal atrial fibrillation, ulcerative colitis, hypertension, psoriasis who presents for follow-up.  He was referred by Malka So, PA for evaluation of atrial fibrillation.  He was diagnosed with A-fib at ED visit 04/16/2022.  CHA2DS2-VASc score 3, was started on Eliquis 5 mg twice daily as well as diltiazem 30 mg as needed for palpitations/tachycardia.  He reports that he was dehydrated when he went into A-fib.  He felt tired but did not note any palpitations.  His wife checked his pulse and noted it was fast and irregular.  He reports occasional lightheadedness but denies any syncope.  Does report he has occasional chest pain.  Describes tightness in center of his chest.  Has not noted relationship with exertion.  He plays golf twice per week for exercise.  No lower extremity edema.  He is taking Eliquis, denies any bleeding issues.  Smoked for 20 years, quit in his early 62s.  Thinks father had MI in 26s.  Echocardiogram 05/02/2022 showed normal biventricular function, no significant valvular disease.  Coronary CTA on 07/11/2022 showed severe stenosis in mid to distal LAD (CT FFR 0.64), calcium score 610 (72nd percentile).  LHC 07/22/2022 showed heavily calcified 85% mid LAD stenosis; medical management recommended and consider PCI worsening symptoms.  Since last clinic visit, he reports he has been doing okay.  Does report has had some recent chest pain.  Took nitroglycerin x1 recently.  He plays golf and cuts grass for exercise, has not had chest pain with this.  No dyspnea.  Reports some lightheadedness with standing, no syncope.   No bleeding issues on Eliquis.  Past Medical History:  Diagnosis Date   Chicken pox    Colitis, ulcerative ([place])    Hypertension    Macular degeneration, bilateral 07/2012   areds 2    Psoriasis     Past Surgical History:  Procedure Laterality Date   ACNE CYST REMOVAL     CATARACT EXTRACTION W/ INTRAOCULAR LENS IMPLANT  10/2014   bilat   COLONOSCOPY     FOOT SURGERY  1988   LEFT HEART CATH AND CORONARY ANGIOGRAPHY N/A 07/22/2022   Procedure: LEFT HEART CATH AND CORONARY ANGIOGRAPHY;  Surgeon: Belva Crome, MD;  Location: [facility];  Service: Cardiovascular;  Laterality: N/A;   SHOULDER ARTHROSCOPY  2007   Right   TONSILLECTOMY      Current Medications: Current Meds  Medication Sig   acetaminophen (TYLENOL) 650 MG CR tablet Take 650 mg by mouth every 8 (eight) hours as needed for pain.   Ascorbic Acid (VITAMIN C PO) Take 1 tablet by mouth daily. 1000 mg   aspirin EC 81 MG tablet Take 1 tablet (81 mg total) by mouth daily. Swallow whole.   atorvastatin (LIPITOR) 20 MG tablet TAKE 1 TABLET BY MOUTH EVERY DAY   clobetasol ointment (TEMOVATE) 5.28 % Apply 1 Application topically 2 (two) times daily as needed (irritation).   diltiazem (CARDIZEM) 30 MG tablet Take one tablet by mouth every 4 hrs as needed, Heart rate needs to be above 100,  top number blood pressure above 100   diphenhydramine-acetaminophen (TYLENOL PM) 25-500 MG TABS tablet Take 1 tablet by mouth at bedtime as needed (pain/sleep).   faricimab-svoa (VABYSMO) 6 MG/0.05ML SOLN intravitreal injection by Intravitreal route every 30 (thirty) days.   isosorbide mononitrate (IMDUR) 30 MG 24 hr tablet Take 0.5 tablets (15 mg total) by mouth daily.   L-Lysine 1000 MG TABS Take 1,000 mg by mouth daily.   lisinopril (ZESTRIL) 20 MG tablet TAKE 1 TABLET BY MOUTH EVERY DAY   Multiple Vitamins-Minerals (PRESERVISION AREDS 2) CAPS Take 1 capsule by mouth 2 (two) times daily before lunch and supper.   nitroGLYCERIN  (NITROSTAT) 0.4 MG SL tablet Place 1 tablet (0.4 mg total) under the tongue every 5 (five) minutes as needed.   Propylene Glycol (SYSTANE COMPLETE OP) Place 1 drop into both eyes 2 (two) times daily.     Allergies:   Sulfonamide derivatives   Social History   Socioeconomic History   Marital status: Married    Spouse name: Not on file   Number of children: 1   Years of education: 12   Highest education level: Not on file  Occupational History   Occupation: Herbalist    Comment: retired   Tobacco Use   Smoking status: Former    Types: Cigarettes    Quit date: 10/23/1989    Years since quitting: 32.8   Smokeless tobacco: Never  Vaping Use   Vaping Use: Never used  Substance and Sexual Activity   Alcohol use: Yes    Alcohol/week: 0.0 standard drinks of alcohol    Comment: rare glass of wine - less than once a year   Drug use: No   Sexual activity: Not Currently  Other Topics Concern   Not on file  Social History Narrative   Married 1971. 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 [employer] 6 years.       ACP: HCPOA - wife. Yes for acute CPR, no for prolonged mechanical ventilatory support, no for prolonged artificial nutrition or other heroic measures leaving him in an incapacitated state.    Social Determinants of Health   Financial Resource Strain: Low Risk  (01/27/2022)   Overall Financial Resource Strain (CARDIA)    Difficulty of Paying Living Expenses: Not hard at all  Food Insecurity: No Food Insecurity (01/27/2022)   Hunger Vital Sign    Worried About Running Out of Food in the Last Year: Never true    Ran Out of Food in the Last Year: Never true  Transportation Needs: No Transportation Needs (01/27/2022)   PRAPARE - Hydrologist (Medical): No    Lack of Transportation (Non-Medical): No  Physical Activity:  Sufficiently Active (01/27/2022)   Exercise Vital Sign    Days of Exercise per Week: 2 days    Minutes of Exercise per Session: 150+ min  Stress: No Stress Concern Present (01/27/2022)   [place]    Feeling of Stress : Not at all  Social Connections: Moderately Integrated (01/27/2022)   Social Connection and Isolation Panel [NHANES]    Frequency of Communication with Friends and Family: More than three times a week    Frequency of Social Gatherings with Friends and Family: More than three times a week    Attends Religious Services: More  than 4 times per year    Active Member of Clubs or Organizations: No    Attends Archivist Meetings: Never    Marital Status: Married     Family History: The patient's family history includes Arthritis in his paternal grandmother; Breast cancer in his mother; Dementia in his mother; Diabetes in his daughter and maternal uncle; Emphysema in his maternal grandfather; Heart disease in his father and mother; Hepatitis C in his brother; Hypertension in his father and mother; Hypothyroidism in his mother; Stroke in his maternal grandmother and mother. There is no history of Colon cancer, Esophageal cancer, Stomach cancer, or Rectal cancer.  ROS:   Please see the history of present illness.     All other systems reviewed and are negative.  EKGs/Labs/Other Studies Reviewed:    The following studies were reviewed today:   EKG:   06/20/2022: Normal sinus rhythm, rate 61, no ST abnormality 07/15/22: NSR, rate 59  Recent Labs: 04/16/2022: ALT 9; B Natriuretic Peptide 402.8; Magnesium 2.1 06/15/2022: TSH 1.15 07/15/2022: BUN 18; Creatinine, Ser 0.98; Hemoglobin 14.2; Platelets 144; Potassium 4.6; Sodium 139  Recent Lipid Panel    Component Value Date/Time   CHOL 103 12/16/2021 1031   TRIG 72.0 12/16/2021 1031   TRIG 58 09/01/2006 0749   HDL 47.80 12/16/2021 1031   CHOLHDL 2 12/16/2021 1031    VLDL 14.4 12/16/2021 1031   LDLCALC 41 12/16/2021 1031    Physical Exam:    VS:  BP 115/60   Pulse (!) 58   Ht 5' 9"  (1.753 m)   Wt 192 lb 9.6 oz (87.4 kg)   SpO2 98%   BMI 28.44 kg/m     Wt Readings from Last 3 Encounters:  08/16/22 192 lb 9.6 oz (87.4 kg)  07/22/22 188 lb (85.3 kg)  07/15/22 192 lb 9.6 oz (87.4 kg)     GEN:  Well nourished, well developed in no acute distress HEENT: Normal NECK: No JVD; No carotid bruits LYMPHATICS: No lymphadenopathy CARDIAC: RRR, no murmurs, rubs, gallops RESPIRATORY:  Clear to auscultation without rales, wheezing or rhonchi  ABDOMEN: Soft, non-tender, non-distended MUSCULOSKELETAL:  No edema; No deformity  SKIN: Warm and dry NEUROLOGIC:  Alert and oriented x 3 PSYCHIATRIC:  Normal affect   ASSESSMENT:    1. Coronary artery disease of native artery of native heart with stable angina pectoris (HCC)   2. Paroxysmal atrial fibrillation ([place])   3. Essential hypertension   4. Hyperlipidemia, unspecified hyperlipidemia type      PLAN:    CAD: Reported atypical chest pain.  Echocardiogram 05/02/2022 showed normal biventricular function, no significant valvular disease.  Coronary CTA on 07/11/2022 showed severe stenosis in mid to distal LAD (CT FFR 0.64), calcium score 610 (72nd percentile).  LHC 07/22/2022 showed heavily calcified 85% mid LAD stenosis; medical management recommended and consider PCI worsening symptoms. -Continue aspirin 81 mg daily -Continue atorvastatin -Reports recent chest pain, will start Imdur 15 mg daily.  If continues to have chest pain despite antianginals, plan for PCI -As needed sublingual nitroglycerin  Paroxysmal atrial fibrillation: He was diagnosed with A-fib at ED visit 04/16/2022.  CHA2DS2-VASc score 3, was started on Eliquis 5 mg twice daily as well as diltiazem 30 mg as needed for palpitations/tachycardia.  Echo 05/02/22 showed normal biventricular function, no significant valvular disease.  -Continue  Eliquis 5 mg twice daily -Continue diltiazem 30 mg as needed.  No scheduled AV nodal blockers given low resting heart rate -Zio patch x 7 days  06/20/2022 showed no A-fib, 49 episodes of SVT with longest lasting 12 seconds with average rate 132 bpm.  Discussed K[facility] mobile for long term Afib monitoring  Hypertension: On lisinopril 20 mg daily.  Appears controlled  Hyperlipidemia: On atorvastatin 20 mg daily.  LDL 41 on 12/16/2021   RTC in 3 months    Medication Adjustments/Labs and Tests Ordered: Current medicines are reviewed at length with the patient today.  Concerns regarding medicines are outlined above.  No orders of the defined types were placed in this encounter.  Meds ordered this encounter  Medications   isosorbide mononitrate (IMDUR) 30 MG 24 hr tablet    Sig: Take 0.5 tablets (15 mg total) by mouth daily.    Dispense:  45 tablet    Refill:  3    Patient Instructions  Medication Instructions:  START isosorbide (Imdur) 15 mg daily  *If you need a refill on your cardiac medications before your next appointment, please call your pharmacy*  Follow-Up: At [facility], you and your health needs are our priority.  As part of our continuing mission to provide you with exceptional heart care, we have created designated Provider Care Teams.  These Care Teams include your primary Cardiologist (physician) and Advanced Practice Providers (APPs -  Physician Assistants and Nurse Practitioners) who all work together to provide you with the care you need, when you need it.  We recommend signing up for the patient portal called "MyChart".  Sign up information is provided on this After Visit Summary.  MyChart is used to connect with patients for Virtual Visits (Telemedicine).  Patients are able to view lab/test results, encounter notes, upcoming appointments, etc.  Non-urgent messages can be sent to your provider as well.   To learn more about what you can do with MyChart, go to  [email or web address].    Your next appointment:   3 month(s)  The format for your next appointment:   In Person  Provider:   Dr. Gardiner Rhyme Other Instructions Please let us know if you continue to have chest pain or if pain worsens.   [facility]         Signed, Donato Heinz, MD  08/16/2022 9:24 AM    [place]

## 2022-08-16 ENCOUNTER — Ambulatory Visit: Payer: PPO | Attending: Cardiology | Admitting: Cardiology

## 2022-08-16 ENCOUNTER — Encounter: Payer: Self-pay | Admitting: Cardiology

## 2022-08-16 VITALS — BP 115/60 | HR 58 | Ht 69.0 in | Wt 192.6 lb

## 2022-08-16 DIAGNOSIS — I10 Essential (primary) hypertension: Secondary | ICD-10-CM

## 2022-08-16 DIAGNOSIS — E785 Hyperlipidemia, unspecified: Secondary | ICD-10-CM

## 2022-08-16 DIAGNOSIS — I480 Paroxysmal atrial fibrillation: Secondary | ICD-10-CM | POA: Diagnosis not present

## 2022-08-16 DIAGNOSIS — I25118 Atherosclerotic heart disease of native coronary artery with other forms of angina pectoris: Secondary | ICD-10-CM | POA: Diagnosis not present

## 2022-08-16 MED ORDER — ISOSORBIDE MONONITRATE ER 30 MG PO TB24
15.0000 mg | ORAL_TABLET | Freq: Every day | ORAL | 3 refills | Status: DC
Start: 2022-08-16 — End: 2023-03-07

## 2022-08-16 NOTE — Patient Instructions (Addendum)
Medication Instructions:  START isosorbide (Imdur) 15 mg daily  *If you need a refill on your cardiac medications before your next appointment, please call your pharmacy*  Follow-Up: At [facility], you and your health needs are our priority.  As part of our continuing mission to provide you with exceptional heart care, we have created designated Provider Care Teams.  These Care Teams include your primary Cardiologist (physician) and Advanced Practice Providers (APPs -  Physician Assistants and Nurse Practitioners) who all work together to provide you with the care you need, when you need it.  We recommend signing up for the patient portal called "MyChart".  Sign up information is provided on this After Visit Summary.  MyChart is used to connect with patients for Virtual Visits (Telemedicine).  Patients are able to view lab/test results, encounter notes, upcoming appointments, etc.  Non-urgent messages can be sent to your provider as well.   To learn more about what you can do with MyChart, go to [email or web address].    Your next appointment:   3 month(s)  The format for your next appointment:   In Person  Provider:   Dr. Gardiner Rhyme Other Instructions Please let us know if you continue to have chest pain or if pain worsens.   [facility]LE

## 2022-08-17 ENCOUNTER — Ambulatory Visit (INDEPENDENT_AMBULATORY_CARE_PROVIDER_SITE_OTHER): Payer: PPO | Admitting: *Deleted

## 2022-08-17 DIAGNOSIS — Z23 Encounter for immunization: Secondary | ICD-10-CM

## 2022-08-30 DIAGNOSIS — H353211 Exudative age-related macular degeneration, right eye, with active choroidal neovascularization: Secondary | ICD-10-CM | POA: Diagnosis not present

## 2022-08-30 DIAGNOSIS — H43811 Vitreous degeneration, right eye: Secondary | ICD-10-CM | POA: Diagnosis not present

## 2022-08-30 DIAGNOSIS — H353122 Nonexudative age-related macular degeneration, left eye, intermediate dry stage: Secondary | ICD-10-CM | POA: Diagnosis not present

## 2022-09-05 ENCOUNTER — Telehealth: Payer: Self-pay | Admitting: *Deleted

## 2022-09-05 NOTE — Telephone Encounter (Signed)
Pt didn't wish to schedule appt. 09/05/22 TTP. 10/23  APPROVED-Authorization #[id number] Dates:08/22/2022 - 11/20/2022  Pt has declined his sleep study.

## 2022-09-17 ENCOUNTER — Other Ambulatory Visit (HOSPITAL_COMMUNITY): Payer: Self-pay | Admitting: Physician Assistant

## 2022-09-17 DIAGNOSIS — I480 Paroxysmal atrial fibrillation: Secondary | ICD-10-CM

## 2022-09-19 ENCOUNTER — Telehealth: Payer: Self-pay | Admitting: Cardiology

## 2022-09-19 ENCOUNTER — Other Ambulatory Visit: Payer: Self-pay

## 2022-09-19 DIAGNOSIS — I480 Paroxysmal atrial fibrillation: Secondary | ICD-10-CM

## 2022-09-19 MED ORDER — APIXABAN 5 MG PO TABS: 5.0000 mg | ORAL_TABLET | Freq: Two times a day (BID) | ORAL | 1 refills | Status: DC

## 2022-09-19 NOTE — Telephone Encounter (Signed)
*  STAT* If patient is at the pharmacy, call can be transferred to refill team.   1. Which medications need to be refilled? (please list name of each medication and dose if known)   apixaban (ELIQUIS) 5 MG TABS tablet (Expired)    2. Which pharmacy/location (including street and city if local pharmacy) is medication to be sent to?  Take 1 tablet (5 mg total) by mouth 2 (two) times daily. - Oral   3. Do they need a 30 day or 90 day supply? 68  Pt wife states pt doesn't go to afib clinic anymore

## 2022-09-19 NOTE — Telephone Encounter (Signed)
Prescription refill request for Eliquis received. Indication: Afib  Last office visit:08/16/22 Gardiner Rhyme)  Scr: 0.98 (07/15/22)  Age: 71 Weight: 87.4kg  Appropriate dose and refill sent to requested pharmacy.

## 2022-09-19 NOTE — Telephone Encounter (Signed)
Prescription refill request for Eliquis received. Indication:afib Last office visit:10/23 Scr:0.9 Age: 71 Weight:87.4 kg  Prescription refills

## 2022-09-27 DIAGNOSIS — H35033 Hypertensive retinopathy, bilateral: Secondary | ICD-10-CM | POA: Diagnosis not present

## 2022-09-27 DIAGNOSIS — H43813 Vitreous degeneration, bilateral: Secondary | ICD-10-CM | POA: Diagnosis not present

## 2022-09-27 DIAGNOSIS — H353211 Exudative age-related macular degeneration, right eye, with active choroidal neovascularization: Secondary | ICD-10-CM | POA: Diagnosis not present

## 2022-09-27 DIAGNOSIS — H353122 Nonexudative age-related macular degeneration, left eye, intermediate dry stage: Secondary | ICD-10-CM | POA: Diagnosis not present

## 2022-10-01 ENCOUNTER — Encounter: Payer: Self-pay | Admitting: Family Medicine

## 2022-10-11 ENCOUNTER — Ambulatory Visit (HOSPITAL_BASED_OUTPATIENT_CLINIC_OR_DEPARTMENT_OTHER): Payer: PPO | Attending: Physician Assistant | Admitting: Cardiovascular Disease

## 2022-10-11 VITALS — Ht 69.0 in | Wt 195.0 lb

## 2022-10-11 DIAGNOSIS — G4736 Sleep related hypoventilation in conditions classified elsewhere: Secondary | ICD-10-CM | POA: Diagnosis not present

## 2022-10-11 DIAGNOSIS — I480 Paroxysmal atrial fibrillation: Secondary | ICD-10-CM

## 2022-10-11 DIAGNOSIS — G4733 Obstructive sleep apnea (adult) (pediatric): Secondary | ICD-10-CM | POA: Diagnosis not present

## 2022-10-17 ENCOUNTER — Ambulatory Visit (INDEPENDENT_AMBULATORY_CARE_PROVIDER_SITE_OTHER): Payer: PPO

## 2022-10-17 ENCOUNTER — Ambulatory Visit (INDEPENDENT_AMBULATORY_CARE_PROVIDER_SITE_OTHER): Payer: PPO | Admitting: Sports Medicine

## 2022-10-17 VITALS — BP 132/82 | HR 60 | Ht 69.0 in | Wt 195.0 lb

## 2022-10-17 DIAGNOSIS — M25522 Pain in left elbow: Secondary | ICD-10-CM

## 2022-10-17 NOTE — Patient Instructions (Addendum)
Good to see you  Elbow HEP  Recommend tylenol arthritis 650 mg 1 tablet 3 times a day Voltaren gel over areas of pain  1-2 times a day  2-3 week follow up

## 2022-10-17 NOTE — Progress Notes (Signed)
Edward Goodwin Edward Goodwin Phone: [phone]   Assessment and Plan:     1. Left elbow pain -Acute, uncomplicated, initial sports medicine visit - Unclear etiology of left elbow pain with patient falling onto his arms up a set of stairs 2 days ago on Saturday, 10/15/2022, but not experiencing pain until this morning.  No TTP on physical exam, however inconsistent pain with activation of biceps and triceps - X-ray obtained in clinic.  My interpretation: No acute fracture or dislocation.  No sail sign.  Will await radiology review - Discussed prednisone and prescription NSAIDs, however I advised against these due to patient's past medical history including atrial fibrillation, currently on anticoagulation with Eliquis - Start Tylenol 650 mg 3 times a day - Start Voltaren gel topically over areas of pain 1-2 times per day - Start gentle HEP to prevent stiffness - DG ELBOW COMPLETE LEFT (3+VIEW); Future    Pertinent previous records reviewed include none   Follow Up: 2 to 3 weeks for reevaluation.  If no improvement or worsening of symptoms, could consider CSI geared at area of pain   Subjective:   I, Edward Goodwin, am serving as a Education administrator for Doctor Edward Goodwin  Chief Complaint: left elbow pain   HPI:   10/18/2022 Patient is a 71 year old male complaining of left elbow pain. Patient states that he tripped and fell Saturday . This morning he has pain if he moves  a certain way he has sever pain, pain radiates down the ar, he fell up the stairs , has been taking tylenol and that doesn't help , no numbness or tingling , no decrease in grip strength , decreased ROM due to pain   Relevant Historical Information: On chronic anticoagulation with Eliquis, paroxysmal atrial fibrillation, UC,  Additional pertinent review of systems negative.   Current Outpatient Medications:    acetaminophen (TYLENOL) 650 MG CR  tablet, Take 650 mg by mouth every 8 (eight) hours as needed for pain., Disp: , Rfl:    apixaban (ELIQUIS) 5 MG TABS tablet, Take 1 tablet (5 mg total) by mouth 2 (two) times daily., Disp: 180 tablet, Rfl: 1   Ascorbic Acid (VITAMIN C PO), Take 1 tablet by mouth daily. 1000 mg, Disp: , Rfl:    aspirin EC 81 MG tablet, Take 1 tablet (81 mg total) by mouth daily. Swallow whole., Disp: 90 tablet, Rfl: 3   atorvastatin (LIPITOR) 20 MG tablet, TAKE 1 TABLET BY MOUTH EVERY DAY, Disp: 90 tablet, Rfl: 3   clobetasol ointment (TEMOVATE) 4.23 %, Apply 1 Application topically 2 (two) times daily as needed (irritation)., Disp: , Rfl:    diltiazem (CARDIZEM) 30 MG tablet, Take one tablet by mouth every 4 hrs as needed, Heart rate needs to be above 100, top number blood pressure above 100, Disp: 45 tablet, Rfl: 1   diphenhydramine-acetaminophen (TYLENOL PM) 25-500 MG TABS tablet, Take 1 tablet by mouth at bedtime as needed (pain/sleep)., Disp: , Rfl:    faricimab-svoa (VABYSMO) 6 MG/0.05ML SOLN intravitreal injection, by Intravitreal route every 30 (thirty) days., Disp: , Rfl:    isosorbide mononitrate (IMDUR) 30 MG 24 hr tablet, Take 0.5 tablets (15 mg total) by mouth daily., Disp: 45 tablet, Rfl: 3   L-Lysine 1000 MG TABS, Take 1,000 mg by mouth daily., Disp: , Rfl:    lisinopril (ZESTRIL) 20 MG tablet, TAKE 1 TABLET BY MOUTH EVERY DAY, Disp: 90 tablet, Rfl:  3   Multiple Vitamins-Minerals (PRESERVISION AREDS 2) CAPS, Take 1 capsule by mouth 2 (two) times daily before lunch and supper., Disp: , Rfl:    Propylene Glycol (SYSTANE COMPLETE OP), Place 1 drop into both eyes 2 (two) times daily., Disp: , Rfl:    nitroGLYCERIN (NITROSTAT) 0.4 MG SL tablet, Place 1 tablet (0.4 mg total) under the tongue every 5 (five) minutes as needed., Disp: 25 tablet, Rfl: 3   Objective:     Vitals:   10/17/22 1307  BP: 132/82  Pulse: 60  SpO2: 97%  Weight: 195 lb (88.5 kg)  Height: 5' 9"  (1.753 m)      Body mass index is  28.8 kg/m.    Physical Exam:    General: Appears well, no acute distress, nontoxic and pleasant Neck: FROM, no pain Neuro: sensation is intact distally with no deficits, strenghth is 5/5 in elbow flexors/extenders/supinator/pronators and wrist flexors/extensors Psych: no evidence of anxiety or depression  Left ELBOW: no deformity, swelling or muscle wasting Normal Carrying angle ROM:0-140, supination and pronation 90 Inconsistent pain with activation of triceps, biceps, brachioradialis NTTP over triceps, ticeps tendon, olecronon, lat epicondyle, medial epicondyle, antecubital fossa, biceps tendon, supinator, pronator Negative tinnels over cubital tunnel No pain with resisted wrist and middle digit extension No pain with resisted wrist flexion No pain with resisted supination No pain with resisted pronation Negative valgus stress Negative varus stress Negative milking maneuver    Electronically signed by:  Edward Goodwin D.Edward Goodwin [facility] 1:33 PM 10/17/22

## 2022-10-25 DIAGNOSIS — H353211 Exudative age-related macular degeneration, right eye, with active choroidal neovascularization: Secondary | ICD-10-CM | POA: Diagnosis not present

## 2022-10-30 ENCOUNTER — Encounter (HOSPITAL_BASED_OUTPATIENT_CLINIC_OR_DEPARTMENT_OTHER): Payer: Self-pay | Admitting: Cardiovascular Disease

## 2022-10-30 NOTE — Procedures (Signed)
Patient Name: Edward Goodwin, Edward Goodwin Date: 10/11/2022 Gender: Male D.O.B: [birth year] Age (years): 32 Referring Provider: Malka So PA Height (inches): 69 Interpreting Physician: Shelva Majestic MD, ABSM Weight (lbs): 195 RPSGT: Gwenyth Allegra BMI: 29 MRN: [id number] Neck Size: 16.00  CLINICAL INFORMATION Sleep Study Type: NPSG  Indication for sleep study: Hypertension, CAD, PAF, snoring  Epworth Sleepiness Score: 3  SLEEP STUDY TECHNIQUE As per the AASM Manual for the Scoring of Sleep and Associated Events v2.3 (April 2016) with a hypopnea requiring 4% desaturations.  The channels recorded and monitored were frontal, central and occipital EEG, electrooculogram (EOG), submentalis EMG (chin), nasal and oral airflow, thoracic and abdominal wall motion, anterior tibialis EMG, snore microphone, electrocardiogram, and pulse oximetry.  MEDICATIONS acetaminophen (TYLENOL) 650 MG CR tablet apixaban (ELIQUIS) 5 MG TABS tablet Ascorbic Acid (VITAMIN C PO) aspirin EC 81 MG tablet atorvastatin (LIPITOR) 20 MG tablet clobetasol ointment (TEMOVATE) 0.05 % diltiazem (CARDIZEM) 30 MG tablet diphenhydramine-acetaminophen (TYLENOL PM) 25-500 MG TABS tablet faricimab-svoa (VABYSMO) 6 MG/0.05ML SOLN intravitreal injection isosorbide mononitrate (IMDUR) 30 MG 24 hr tablet L-Lysine 1000 MG TABS lisinopril (ZESTRIL) 20 MG tablet Multiple Vitamins-Minerals (PRESERVISION AREDS 2) CAPS nitroGLYCERIN (NITROSTAT) 0.4 MG SL tablet (Expired) Propylene Glycol (SYSTANE COMPLETE OP) Medications self-administered by patient taken the night of the study : TYLENOL PM  SLEEP ARCHITECTURE The study was initiated at 9:53:22 PM and ended at 4:21:24 AM.  Sleep onset time was 7.8 minutes and the sleep efficiency was 82.0%. The total sleep time was 318.2 minutes.  Stage REM latency was 60.0 minutes.  The patient spent 5.7% of the night in stage N1 sleep, 66.8% in stage N2 sleep, 0.0% in stage N3 and  27.5% in REM.  Alpha intrusion was absent.  Supine sleep was 14.93%.  RESPIRATORY PARAMETERS The overall apnea/hypopnea index (AHI) was 9.6 per hour. The respiratory disturbance index (RDI) was 10.6/h. There were 24 total apneas, including 23 obstructive, 1 central and 0 mixed apneas. There were 27 hypopneas and 5 RERAs.  The AHI during Stage REM sleep was 31.5 per hour.  AHI while supine was 60.6 per hour.  The mean oxygen saturation was 93.3%. The minimum SpO2 during sleep was 79.0%.  Loud snoring was noted during this study.  CARDIAC DATA The 2 lead EKG demonstrated sinus rhythm. The mean heart rate was 50.8 beats per minute. Other EKG findings include: None.  LEG MOVEMENT DATA The total PLMS were 0 with a resulting PLMS index of 0.0. Associated arousal with leg movement index was 0.8 .  IMPRESSIONS - Mild obstructive sleep apnea overall (AHI 9.6/h; RDI 10.6/h): however, sleep apnea was severe during REM sleep (AHI 31.5/h) and very severe with supine sleep (AHI 60.6/h). - No significant central sleep apnea occurred during this study (CAI 0.2/h). - Significant oxygen desaturation to a nadir of 79.0%. - The patient snored with loud snoring volume. - No cardiac abnormalities were noted during this study. - Clinically significant periodic limb movements did not occur during sleep. No significant associated arousals. - DIAGNOSIS - Obstructive Sleep Apnea (G47.33) - Nocturnal Hypoxemia (G47.36)  RECOMMENDATIONS - Therapeutic CPAP titration to determine optimal pressure required to alleviate sleep disordered breathing. In this patient with cardiovascular comorbidities recommend an in-lab CPAP titration; if unable, then initiate AUto PAP with EPR of 3 at 6 - 18 cm of water. - Effort should be made to otpimize nasal and oropharyngeal patency. - Positional therapy avoiding supine position during sleep. - Avoid alcohol, sedatives and other CNS  depressants that may worsen sleep apnea  and disrupt normal sleep architecture. - Sleep hygiene should be reviewed to assess factors that may improve sleep quality. - Weight management and regular exercise should be initiated or continued if appropriate. - Recommend a download and sleep clinic evaluation after 4 weeks of therapy.  [Electronically signed] 10/30/2022 04:42 PM  Shelva Majestic MD, [facility], ABSM Diplomate, American Board of Sleep Medicine NPI: [id number]  [place] PH: [phone]   FX: [phone] [place]

## 2022-11-01 ENCOUNTER — Other Ambulatory Visit: Payer: Self-pay | Admitting: Cardiovascular Disease

## 2022-11-01 ENCOUNTER — Telehealth: Payer: Self-pay | Admitting: *Deleted

## 2022-11-01 DIAGNOSIS — G4736 Sleep related hypoventilation in conditions classified elsewhere: Secondary | ICD-10-CM

## 2022-11-01 DIAGNOSIS — G4733 Obstructive sleep apnea (adult) (pediatric): Secondary | ICD-10-CM

## 2022-11-01 NOTE — Progress Notes (Signed)
Aleen Sells D.[facility] [address] [place] [address] Phone: [phone]   Assessment and Plan:     1. Left elbow pain -Acute, improving, subsequent visit - Etiology of left elbow pain is still unclear, but suspect muscular bruise from patient following up a set of stairs that has gradually improved with relative rest, Tylenol, Voltaren gel - Continue HEP, Tylenol, Voltaren gel - Do not recommend prednisone or NSAIDs due to patient's past medical history of atrial fibrillation and chronic anticoagulation on Eliquis  2. Right elbow pain  -Acute, initial visit - New contusion over right lateral forearm just distal to lateral epicondyle from patient accidentally dropping a trunk onto his arm - No signs of fracture on physical exam, so no x-ray performed today - Recommend HEP, Tylenol, Voltaren gel, relative rest - Follow-up if new sharp pain or weakness present in right upper extremity  Pertinent previous records reviewed include none   Follow Up: As needed if no improvement or worsening of symptoms   Subjective:   I, [name], am serving as a Neurosurgeon for Doctor Richardean Sale   Chief Complaint: left elbow pain    HPI:    10/18/2022 Patient is a 71 year old male complaining of left elbow pain. Patient states that he tripped and fell Saturday . This morning he has pain if he moves  a certain way he has sever pain, pain radiates down the ar, he fell up the stairs , has been taking tylenol and that doesn't help , no numbness or tingling , no decrease in grip strength , decreased ROM due to pain   11/02/2022 Patient states he was doing well until yesterday, his trunk closed on him , he has some bruising and no numbness and tingling, grip strength equal bilateral    Relevant Historical Information: On chronic anticoagulation with Eliquis, paroxysmal atrial fibrillation, UC,  Additional pertinent review of systems  negative.   Current Outpatient Medications:    acetaminophen (TYLENOL) 650 MG CR tablet, Take 650 mg by mouth every 8 (eight) hours as needed for pain., Disp: , Rfl:    apixaban (ELIQUIS) 5 MG TABS tablet, Take 1 tablet (5 mg total) by mouth 2 (two) times daily., Disp: 180 tablet, Rfl: 1   Ascorbic Acid (VITAMIN C PO), Take 1 tablet by mouth daily. 1000 mg, Disp: , Rfl:    aspirin EC 81 MG tablet, Take 1 tablet (81 mg total) by mouth daily. Swallow whole., Disp: 90 tablet, Rfl: 3   atorvastatin (LIPITOR) 20 MG tablet, TAKE 1 TABLET BY MOUTH EVERY DAY, Disp: 90 tablet, Rfl: 3   clobetasol ointment (TEMOVATE) 0.05 %, Apply 1 Application topically 2 (two) times daily as needed (irritation)., Disp: , Rfl:    diltiazem (CARDIZEM) 30 MG tablet, Take one tablet by mouth every 4 hrs as needed, Heart rate needs to be above 100, top number blood pressure above 100, Disp: 45 tablet, Rfl: 1   diphenhydramine-acetaminophen (TYLENOL PM) 25-500 MG TABS tablet, Take 1 tablet by mouth at bedtime as needed (pain/sleep)., Disp: , Rfl:    faricimab-svoa (VABYSMO) 6 MG/0.05ML SOLN intravitreal injection, by Intravitreal route every 30 (thirty) days., Disp: , Rfl:    isosorbide mononitrate (IMDUR) 30 MG 24 hr tablet, Take 0.5 tablets (15 mg total) by mouth daily., Disp: 45 tablet, Rfl: 3   L-Lysine 1000 MG TABS, Take 1,000 mg by mouth daily., Disp: , Rfl:    lisinopril (ZESTRIL) 20 MG tablet, TAKE  1 TABLET BY MOUTH EVERY DAY, Disp: 90 tablet, Rfl: 3   Multiple Vitamins-Minerals (PRESERVISION AREDS 2) CAPS, Take 1 capsule by mouth 2 (two) times daily before lunch and supper., Disp: , Rfl:    Propylene Glycol (SYSTANE COMPLETE OP), Place 1 drop into both eyes 2 (two) times daily., Disp: , Rfl:    nitroGLYCERIN (NITROSTAT) 0.4 MG SL tablet, Place 1 tablet (0.4 mg total) under the tongue every 5 (five) minutes as needed., Disp: 25 tablet, Rfl: 3   Objective:     Vitals:   11/02/22 0911  BP: 110/78  Pulse: 66  SpO2:  99%  Weight: 192 lb (87.1 kg)  Height: 5\' 9"  (1.753 m)      Body mass index is 28.35 kg/m.    Physical Exam:    General: Appears well, no acute distress, nontoxic and pleasant Neck: FROM, no pain Neuro: sensation is intact distally with no deficits, strenghth is 5/5 in elbow flexors/extenders/supinator/pronators and wrist flexors/extensors Psych: no evidence of anxiety or depression  Bilateral ELBOW: no deformity,   or muscle wasting.  Mild edema and ecchymosis over right lateral forearm distal to lateral epicondyle that is TTP Normal Carrying angle ROM:0-140, supination and pronation 90 NTTP over triceps, ticeps tendon, olecronon, lat epicondyle, medial epicondyle, antecubital fossa, biceps tendon, supinator, pronator Negative tinnels over cubital tunnel No pain with resisted wrist and middle digit extension No pain with resisted wrist flexion No pain with resisted supination No pain with resisted pronation Negative valgus stress Negative varus stress Negative milking maneuver    Electronically signed by:  Aleen Sells D.[facility] 9:35 AM 11/02/22

## 2022-11-01 NOTE — Telephone Encounter (Signed)
Message left to return a call to discuss sleep study findings.

## 2022-11-01 NOTE — Telephone Encounter (Signed)
Patient returned a call to me and was given sleep study results and recommendations. He agrees to proceed with CPAP titration pending insurance approval.

## 2022-11-01 NOTE — Telephone Encounter (Signed)
-----   Message from Troy Sine, MD sent at 10/30/2022  4:47 PM EST ----- [name], please notify pt of results; try for in-lab CPAP titration, if unable, then Auto-PAP

## 2022-11-02 ENCOUNTER — Ambulatory Visit (INDEPENDENT_AMBULATORY_CARE_PROVIDER_SITE_OTHER): Payer: PPO | Admitting: Sports Medicine

## 2022-11-02 VITALS — BP 110/78 | HR 66 | Ht 69.0 in | Wt 192.0 lb

## 2022-11-02 DIAGNOSIS — M25521 Pain in right elbow: Secondary | ICD-10-CM

## 2022-11-02 DIAGNOSIS — M25522 Pain in left elbow: Secondary | ICD-10-CM | POA: Diagnosis not present

## 2022-11-02 NOTE — Patient Instructions (Signed)
Good to see you

## 2022-11-28 ENCOUNTER — Ambulatory Visit: Payer: PPO | Admitting: Cardiology

## 2022-12-06 DIAGNOSIS — H353122 Nonexudative age-related macular degeneration, left eye, intermediate dry stage: Secondary | ICD-10-CM | POA: Diagnosis not present

## 2022-12-06 DIAGNOSIS — H353211 Exudative age-related macular degeneration, right eye, with active choroidal neovascularization: Secondary | ICD-10-CM | POA: Diagnosis not present

## 2022-12-06 DIAGNOSIS — H43813 Vitreous degeneration, bilateral: Secondary | ICD-10-CM | POA: Diagnosis not present

## 2022-12-06 DIAGNOSIS — H35033 Hypertensive retinopathy, bilateral: Secondary | ICD-10-CM | POA: Diagnosis not present

## 2022-12-07 ENCOUNTER — Encounter (HOSPITAL_BASED_OUTPATIENT_CLINIC_OR_DEPARTMENT_OTHER): Payer: PPO | Admitting: Cardiovascular Disease

## 2022-12-20 ENCOUNTER — Ambulatory Visit: Payer: PPO | Attending: Cardiology | Admitting: Cardiology

## 2022-12-20 ENCOUNTER — Encounter: Payer: Self-pay | Admitting: Cardiology

## 2022-12-20 VITALS — BP 98/58 | HR 55 | Ht 69.5 in | Wt 190.6 lb

## 2022-12-20 DIAGNOSIS — I480 Paroxysmal atrial fibrillation: Secondary | ICD-10-CM

## 2022-12-20 DIAGNOSIS — E785 Hyperlipidemia, unspecified: Secondary | ICD-10-CM

## 2022-12-20 DIAGNOSIS — I10 Essential (primary) hypertension: Secondary | ICD-10-CM

## 2022-12-20 DIAGNOSIS — I25118 Atherosclerotic heart disease of native coronary artery with other forms of angina pectoris: Secondary | ICD-10-CM

## 2022-12-20 MED ORDER — LISINOPRIL 10 MG PO TABS: 10.0000 mg | ORAL_TABLET | Freq: Every day | ORAL | 3 refills | Status: DC

## 2022-12-20 MED ORDER — NITROGLYCERIN 0.4 MG SL SUBL: 0.4000 mg | SUBLINGUAL_TABLET | SUBLINGUAL | 3 refills | Status: DC | PRN

## 2022-12-20 NOTE — Patient Instructions (Signed)
Medication Instructions:  STOP aspirin DECREASE lisinopril to 10 mg daily  *If you need a refill on your cardiac medications before your next appointment, please call your pharmacy*  Follow-Up: At [facility], you and your health needs are our priority.  As part of our continuing mission to provide you with exceptional heart care, we have created designated Provider Care Teams.  These Care Teams include your primary Cardiologist (physician) and Advanced Practice Providers (APPs -  Physician Assistants and Nurse Practitioners) who all work together to provide you with the care you need, when you need it.  We recommend signing up for the patient portal called "MyChart".  Sign up information is provided on this After Visit Summary.  MyChart is used to connect with patients for Virtual Visits (Telemedicine).  Patients are able to view lab/test results, encounter notes, upcoming appointments, etc.  Non-urgent messages can be sent to your provider as well.   To learn more about what you can do with MyChart, go to [email or web address].    Your next appointment:   6 month(s)  Provider:   Dr. Gardiner Rhyme Other Instructions Please check your blood pressure at home daily, write it down.  Call the office or send message via Mychart with the readings in 2 weeks for Dr. Gardiner Rhyme to review.

## 2022-12-20 NOTE — Progress Notes (Signed)
Cardiology Office Note:    Date:  12/20/2022   ID:  Holmes Hays, DOB Sep 20, 1951, MRN [id number]  PCP:  [name], MD  Cardiologist:  None  Electrophysiologist:  None   Referring MD: [name], MD   No chief complaint on file.   History of Present Illness:    Edward Goodwin is a 72 y.o. male with a hx of paroxysmal atrial fibrillation, ulcerative colitis, hypertension, psoriasis who presents for follow-up.  He was referred by Malka So, PA for evaluation of atrial fibrillation.  He was diagnosed with A-fib at ED visit 04/16/2022.  CHA2DS2-VASc score 3, was started on Eliquis 5 mg twice daily as well as diltiazem 30 mg as needed for palpitations/tachycardia.  He reports that he was dehydrated when he went into A-fib.  He felt tired but did not note any palpitations.  His wife checked his pulse and noted it was fast and irregular.  He reports occasional lightheadedness but denies any syncope.  Does report he has occasional chest pain.  Describes tightness in center of his chest.  Has not noted relationship with exertion.  He plays golf twice per week for exercise.  No lower extremity edema.  He is taking Eliquis, denies any bleeding issues.  Smoked for 20 years, quit in his early 64s.  Thinks father had MI in 93s.  Echocardiogram 05/02/2022 showed normal biventricular function, no significant valvular disease.  Coronary CTA on 07/11/2022 showed severe stenosis in mid to distal LAD (CT FFR 0.64), calcium score 610 (72nd percentile).  LHC 07/22/2022 showed heavily calcified 85% mid LAD stenosis; medical management recommended and consider PCI worsening symptoms.  Since last clinic visit, he reports he has been doing well.  States that chest pain has improved, had to use nitroglycerin 1 month ago.  Had slight discomfort lasting few seconds with walking to [employer] last week, other than that no recent chest pain.  Did some yard work recently, denied any exertional symptoms.  Denies any  dyspnea, lower extremity edema, or palpitations.  Does report some lightheadedness but denies any syncope.  No pain in legs with walking.  Reports occasional palpitations.  No bleeding on Eliquis.   Past Medical History:  Diagnosis Date   Chicken pox    Colitis, ulcerative ([place])    Hypertension    Macular degeneration, bilateral 07/2012   areds 2    Psoriasis     Past Surgical History:  Procedure Laterality Date   ACNE CYST REMOVAL     CATARACT EXTRACTION W/ INTRAOCULAR LENS IMPLANT  10/2014   bilat   COLONOSCOPY     FOOT SURGERY  1988   LEFT HEART CATH AND CORONARY ANGIOGRAPHY N/A 07/22/2022   Procedure: LEFT HEART CATH AND CORONARY ANGIOGRAPHY;  Surgeon: Belva Crome, MD;  Location: [place] [facility];  Service: Cardiovascular;  Laterality: N/A;   SHOULDER ARTHROSCOPY  2007   Right   TONSILLECTOMY      Current Medications: Current Meds  Medication Sig   acetaminophen (TYLENOL) 650 MG CR tablet Take 650 mg by mouth every 8 (eight) hours as needed for pain.   aflibercept (EYLEA HD) 8 MG/0.07ML SOLN by Intravitreal route.   apixaban (ELIQUIS) 5 MG TABS tablet Take 1 tablet (5 mg total) by mouth 2 (two) times daily.   Ascorbic Acid (VITAMIN C PO) Take 1 tablet by mouth daily. 1000 mg   atorvastatin (LIPITOR) 20 MG tablet TAKE 1 TABLET BY MOUTH EVERY DAY   clobetasol ointment (  TEMOVATE) 2.97 % Apply 1 Application topically 2 (two) times daily as needed (irritation).   diltiazem (CARDIZEM) 30 MG tablet Take one tablet by mouth every 4 hrs as needed, Heart rate needs to be above 100, top number blood pressure above 100   diphenhydramine-acetaminophen (TYLENOL PM) 25-500 MG TABS tablet Take 1 tablet by mouth at bedtime as needed (pain/sleep).   isosorbide mononitrate (IMDUR) 30 MG 24 hr tablet Take 0.5 tablets (15 mg total) by mouth daily.   L-Lysine 1000 MG TABS Take 1,000 mg by mouth daily.   Multiple Vitamins-Minerals (PRESERVISION AREDS 2) CAPS Take 1 capsule by mouth 2 (two)  times daily before lunch and supper.   Propylene Glycol (SYSTANE COMPLETE OP) Place 1 drop into both eyes 2 (two) times daily.   [DISCONTINUED] aspirin EC 81 MG tablet Take 1 tablet (81 mg total) by mouth daily. Swallow whole.   [DISCONTINUED] lisinopril (ZESTRIL) 20 MG tablet TAKE 1 TABLET BY MOUTH EVERY DAY     Allergies:   Sulfonamide derivatives   Social History   Socioeconomic History   Marital status: Married    Spouse name: Not on file   Number of children: 1   Years of education: 12   Highest education level: Not on file  Occupational History   Occupation: Herbalist    Comment: retired   Tobacco Use   Smoking status: Former    Types: Cigarettes    Quit date: 10/23/1989    Years since quitting: 33.1   Smokeless tobacco: Never  Vaping Use   Vaping Use: Never used  Substance and Sexual Activity   Alcohol use: Yes    Alcohol/week: 0.0 standard drinks of alcohol    Comment: rare glass of wine - less than once a year   Drug use: No   Sexual activity: Not Currently  Other Topics Concern   Not on file  Social History Narrative   Married 1971. 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 [employer] 6 years.       ACP: HCPOA - wife. Yes for acute CPR, no for prolonged mechanical ventilatory support, no for prolonged artificial nutrition or other heroic measures leaving him in an incapacitated state.    Social Determinants of Health   Financial Resource Strain: Low Risk  (01/27/2022)   Overall Financial Resource Strain (CARDIA)    Difficulty of Paying Living Expenses: Not hard at all  Food Insecurity: No Food Insecurity (01/27/2022)   Hunger Vital Sign    Worried About Running Out of Food in the Last Year: Never true    Ran Out of Food in the Last Year: Never true  Transportation Needs: No Transportation Needs (01/27/2022)   PRAPARE - Armed forces logistics/support/administrative officer (Medical): No    Lack of Transportation (Non-Medical): No  Physical Activity: Sufficiently Active (01/27/2022)   Exercise Vital Sign    Days of Exercise per Week: 2 days    Minutes of Exercise per Session: 150+ min  Stress: No Stress Concern Present (01/27/2022)   [place]    Feeling of Stress : Not at all  Social Connections: Moderately Integrated (01/27/2022)   Social Connection and Isolation Panel [NHANES]    Frequency of Communication with Friends and Family: More than three times a week    Frequency of  Social Gatherings with Friends and Family: More than three times a week    Attends Religious Services: More than 4 times per year    Active Member of [employer] or Organizations: No    Attends Music therapist: Never    Marital Status: Married     Family History: The patient's family history includes Arthritis in his paternal grandmother; Breast cancer in his mother; Dementia in his mother; Diabetes in his daughter and maternal uncle; Emphysema in his maternal grandfather; Heart disease in his father and mother; Hepatitis C in his brother; Hypertension in his father and mother; Hypothyroidism in his mother; Stroke in his maternal grandmother and mother. There is no history of Colon cancer, Esophageal cancer, Stomach cancer, or Rectal cancer.  ROS:   Please see the history of present illness.     All other systems reviewed and are negative.  EKGs/Labs/Other Studies Reviewed:    The following studies were reviewed today:   EKG:   06/20/2022: Normal sinus rhythm, rate 61, no ST abnormality 07/15/22: NSR, rate 59 12/20/22: Sinus bradycardia, rate 55, no ST abnormality  Recent Labs: 04/16/2022: ALT 9; B Natriuretic Peptide 402.8; Magnesium 2.1 06/15/2022: TSH 1.15 07/15/2022: BUN 18; Creatinine, Ser 0.98; Hemoglobin 14.2; Platelets 144; Potassium 4.6; Sodium 139  Recent Lipid Panel    Component  Value Date/Time   CHOL 103 12/16/2021 1031   TRIG 72.0 12/16/2021 1031   TRIG 58 09/01/2006 0749   HDL 47.80 12/16/2021 1031   CHOLHDL 2 12/16/2021 1031   VLDL 14.4 12/16/2021 1031   LDLCALC 41 12/16/2021 1031    Physical Exam:    VS:  BP (!) 98/58   Pulse (!) 55   Ht 5' 9.5" (1.765 m)   Wt 190 lb 9.6 oz (86.5 kg)   SpO2 97%   BMI 27.74 kg/m     Wt Readings from Last 3 Encounters:  12/20/22 190 lb 9.6 oz (86.5 kg)  11/02/22 192 lb (87.1 kg)  10/17/22 195 lb (88.5 kg)     GEN:  Well nourished, well developed in no acute distress HEENT: Normal NECK: No JVD; No carotid bruits LYMPHATICS: No lymphadenopathy CARDIAC: RRR, no murmurs, rubs, gallops RESPIRATORY:  Clear to auscultation without rales, wheezing or rhonchi  ABDOMEN: Soft, non-tender, non-distended MUSCULOSKELETAL:  No edema; No deformity  SKIN: Warm and dry NEUROLOGIC:  Alert and oriented x 3 PSYCHIATRIC:  Normal affect   ASSESSMENT:    1. Coronary artery disease of native artery of native heart with stable angina pectoris (HCC)   2. Paroxysmal atrial fibrillation ([place])   3. Essential hypertension   4. Hyperlipidemia, unspecified hyperlipidemia type      PLAN:    CAD: Reported atypical chest pain.  Echocardiogram 05/02/2022 showed normal biventricular function, no significant valvular disease.  Coronary CTA on 07/11/2022 showed severe stenosis in mid to distal LAD (CT FFR 0.64), calcium score 610 (72nd percentile).  LHC 07/22/2022 showed heavily calcified 85% mid LAD stenosis; medical management recommended and consider PCI worsening symptoms. -Continue Eliquis -Continue atorvastatin -Continue Imdur 15 mg daily.  No recent angina.  If chest pain despite antianginals, plan for PCI -As needed sublingual nitroglycerin  Paroxysmal atrial fibrillation: He was diagnosed with A-fib at ED visit 04/16/2022.  CHA2DS2-VASc score 3, was started on Eliquis 5 mg twice daily as well as diltiazem 30 mg as needed for  palpitations/tachycardia.  Echo 05/02/22 showed normal biventricular function, no significant valvular disease.  -Continue Eliquis 5 mg twice daily -Continue diltiazem 30  mg as needed.  No scheduled AV nodal blockers given low resting heart rate -Zio patch x 7 days 06/20/2022 showed no A-fib, 49 episodes of SVT with longest lasting 12 seconds with average rate 132 bpm.  Discussed Kardia mobile for long term Afib monitoring  Hypertension: On lisinopril 20 mg daily and Imdur 15 mg daily.  Soft BP in clinic today and he reports intermittent lightheadedness.  Will decrease lisinopril to 10 mg daily.  Asked to check BP daily for next 2 weeks and call with results  Hyperlipidemia: On atorvastatin 20 mg daily.  LDL 41 on 12/16/2021.  Has upcoming lab work with PCP, will follow-up results  OSA: diagnosed in 09/2022, starting CPAP   RTC in 6 months    Medication Adjustments/Labs and Tests Ordered: Current medicines are reviewed at length with the patient today.  Concerns regarding medicines are outlined above.  Orders Placed This Encounter  Procedures   EKG 12-Lead   Meds ordered this encounter  Medications   nitroGLYCERIN (NITROSTAT) 0.4 MG SL tablet    Sig: Place 1 tablet (0.4 mg total) under the tongue every 5 (five) minutes as needed.    Dispense:  25 tablet    Refill:  3   lisinopril (ZESTRIL) 10 MG tablet    Sig: Take 1 tablet (10 mg total) by mouth daily.    Dispense:  90 tablet    Refill:  3    Dose decrease    Patient Instructions  Medication Instructions:  STOP aspirin DECREASE lisinopril to 10 mg daily  *If you need a refill on your cardiac medications before your next appointment, please call your pharmacy*  Follow-Up: At [facility], you and your health needs are our priority.  As part of our continuing mission to provide you with exceptional heart care, we have created designated Provider Care Teams.  These Care Teams include your primary Cardiologist (physician)  and Advanced Practice Providers (APPs -  Physician Assistants and Nurse Practitioners) who all work together to provide you with the care you need, when you need it.  We recommend signing up for the patient portal called "MyChart".  Sign up information is provided on this After Visit Summary.  MyChart is used to connect with patients for Virtual Visits (Telemedicine).  Patients are able to view lab/test results, encounter notes, upcoming appointments, etc.  Non-urgent messages can be sent to your provider as well.   To learn more about what you can do with MyChart, go to [email or web address].    Your next appointment:   6 month(s)  Provider:   Dr. Gardiner Rhyme Other Instructions Please check your blood pressure at home daily, write it down.  Call the office or send message via Mychart with the readings in 2 weeks for Dr. Gardiner Rhyme to review.      Signed, Donato Heinz, MD  12/20/2022 8:37 AM    [place]

## 2022-12-22 ENCOUNTER — Ambulatory Visit (HOSPITAL_BASED_OUTPATIENT_CLINIC_OR_DEPARTMENT_OTHER): Payer: PPO | Attending: Cardiovascular Disease | Admitting: Cardiovascular Disease

## 2022-12-22 VITALS — Ht 69.0 in | Wt 195.0 lb

## 2022-12-22 DIAGNOSIS — G4733 Obstructive sleep apnea (adult) (pediatric): Secondary | ICD-10-CM | POA: Diagnosis not present

## 2022-12-22 DIAGNOSIS — G4736 Sleep related hypoventilation in conditions classified elsewhere: Secondary | ICD-10-CM | POA: Diagnosis not present

## 2023-01-02 ENCOUNTER — Telehealth: Payer: Self-pay | Admitting: *Deleted

## 2023-01-02 ENCOUNTER — Encounter (HOSPITAL_BASED_OUTPATIENT_CLINIC_OR_DEPARTMENT_OTHER): Payer: Self-pay | Admitting: Cardiovascular Disease

## 2023-01-02 NOTE — Procedures (Signed)
Patient Name: Edward Goodwin, Edward Goodwin Date: 12/22/2022 Gender: Male D.O.B: 06-27-1951 Age (years): 74 Referring Provider: Malka So PA Height (inches): 69 Interpreting Physician: Shelva Majestic MD, ABSM Weight (lbs): 195 RPSGT: Baxter Flattery BMI: 29 MRN: [id number] Neck Size: 16.00  CLINICAL INFORMATION The patient is referred for a CPAP titration to treat sleep apnea.  Date of NPSG: 10/11/2022:  AHI 9.6/h; RDI 10.6/h; REM AHI 31.5/h; supine AHI 60.6/h; O2 nadir 79%.  SLEEP STUDY TECHNIQUE As per the AASM Manual for the Scoring of Sleep and Associated Events v2.3 (April 2016) with a hypopnea requiring 4% desaturations.  The channels recorded and monitored were frontal, central and occipital EEG, electrooculogram (EOG), submentalis EMG (chin), nasal and oral airflow, thoracic and abdominal wall motion, anterior tibialis EMG, snore microphone, electrocardiogram, and pulse oximetry. Continuous positive airway pressure (CPAP) was initiated at the beginning of the study and titrated to treat sleep-disordered breathing.  MEDICATIONS acetaminophen (TYLENOL) 650 MG CR tablet aflibercept (EYLEA HD) 8 MG/0.07ML SOLN apixaban (ELIQUIS) 5 MG TABS tablet Ascorbic Acid (VITAMIN C PO) atorvastatin (LIPITOR) 20 MG tablet clobetasol ointment (TEMOVATE) 0.05  diltiazem (CARDIZEM) 30 MG tablet diphenhydramine-acetaminophen (TYLENOL PM) 25-500 MG TABS tablet faricimab-svoa (VABYSMO) 6 MG/0.05ML SOLN intravitreal injection isosorbide mononitrate (IMDUR) 30 MG 24 hr tablet L-Lysine 1000 MG TAB lisinopril (ZESTRIL) 10 MG tablet Multiple Vitamins-Minerals (PRESERVISION AREDS 2) CAP nitroGLYCERIN (NITROSTAT) 0.4 MG SL tablet Propylene Glycol (SYSTANE COMPLETE OP) Medications self-administered by patient taken the night of the study : TYLENOL PM  TECHNICIAN COMMENTS Comments added by technician: Patient had difficulty initiating sleep. Patient could not tolerate CPAP. Study was  inconclusive Comments added by scorer: N/A  RESPIRATORY PARAMETERS Optimal PAP Pressure (cm): 7 AHI at Optimal Pressure (/hr): 2.1 Overall Minimal O2 (%): 85.0 Supine % at Optimal Pressure (%): 18 Minimal O2 at Optimal Pressure (%): 85.0   SLEEP ARCHITECTURE The study was initiated at 10:58:18 PM and ended at 3:29:08 AM.  Sleep onset time was 0.5 minutes and the sleep efficiency was 83.3%. The total sleep time was 225.5 minutes.  The patient spent 1.8% of the night in stage N1 sleep, 91.4% in stage N2 sleep, 0.0% in stage N3 and 6.9% in REM.Stage REM latency was 150.0 minutes  Wake after sleep onset was 44.8. Alpha intrusion was absent. Supine sleep was 10.28%.  CARDIAC DATA The 2 lead EKG demonstrated sinus rhythm. The mean heart rate was 95.1 beats per minute. Other EKG findings include: None.  LEG MOVEMENT DATA The total Periodic Limb Movements of Sleep (PLMS) were 0. The PLMS index was 0.0. A PLMS index of <15 is considered normal in adults.  IMPRESSIONS - CPAP was initiated at 5 cm and was titrated to only 7 cm of water. (AHI 2.1/h; O2 nadir 85%) - Moderate oxygen desaturations were observed during this titration to a nadir of 85.0% at 7 cm. - No snoring was audible during this study. - No cardiac abnormalities were observed during this study. - Clinically significant periodic limb movements were not noted during this study. Arousals associated with PLMs were rare.  DIAGNOSIS - Obstructive Sleep Apnea (G47.33)  RECOMMENDATIONS - Recommend an initial trial of CPAP Auto therapy with EPR of 3 at 7 - 14 cm H2O with heated humidification. A large size Fisher&Paykel Full Face Simplus mask was used for the titration. - Effort should be made to optimize nasal and orophayngeal patency. - Avoid alcohol, sedatives and other CNS depressants that may worsen sleep apnea and disrupt normal sleep  architecture. - Sleep hygiene should be reviewed to assess factors that may improve sleep  quality. - Weight management and regular exercise should be initiated or continued. - Recommend a download and sleep clinic evaluation after 4 weeks of therapy.   [Electronically signed] 01/02/2023 09:15 AM  Shelva Majestic MD, [facility] [place], American Board of Sleep Medicine  NPI: [id number]  [place] PH: [phone]   FX: [phone] [place]

## 2023-01-02 NOTE — Telephone Encounter (Signed)
Patient informed CPAP titration completed. Machine ordered. Patient would like to see Dr Claiborne Billings before using.

## 2023-01-02 NOTE — Telephone Encounter (Signed)
-----   Message from Troy Sine, MD sent at 01/02/2023  9:20 AM EST ----- [name], please notify patient of the results and set up with DME company to initiate CPAP.

## 2023-01-04 ENCOUNTER — Encounter: Payer: PPO | Admitting: Family Medicine

## 2023-01-04 DIAGNOSIS — H353212 Exudative age-related macular degeneration, right eye, with inactive choroidal neovascularization: Secondary | ICD-10-CM | POA: Diagnosis not present

## 2023-01-05 ENCOUNTER — Ambulatory Visit (INDEPENDENT_AMBULATORY_CARE_PROVIDER_SITE_OTHER): Payer: PPO | Admitting: Family Medicine

## 2023-01-05 ENCOUNTER — Encounter: Payer: Self-pay | Admitting: Family Medicine

## 2023-01-05 VITALS — BP 126/70 | HR 56 | Temp 97.2°F | Ht 69.0 in | Wt 188.2 lb

## 2023-01-05 DIAGNOSIS — D696 Thrombocytopenia, unspecified: Secondary | ICD-10-CM

## 2023-01-05 DIAGNOSIS — I2510 Atherosclerotic heart disease of native coronary artery without angina pectoris: Secondary | ICD-10-CM | POA: Diagnosis not present

## 2023-01-05 DIAGNOSIS — Z0000 Encounter for general adult medical examination without abnormal findings: Secondary | ICD-10-CM | POA: Diagnosis not present

## 2023-01-05 DIAGNOSIS — E785 Hyperlipidemia, unspecified: Secondary | ICD-10-CM

## 2023-01-05 DIAGNOSIS — M1000 Idiopathic gout, unspecified site: Secondary | ICD-10-CM

## 2023-01-05 DIAGNOSIS — J342 Deviated nasal septum: Secondary | ICD-10-CM

## 2023-01-05 DIAGNOSIS — R351 Nocturia: Secondary | ICD-10-CM | POA: Diagnosis not present

## 2023-01-05 DIAGNOSIS — I714 Abdominal aortic aneurysm, without rupture, unspecified: Secondary | ICD-10-CM | POA: Diagnosis not present

## 2023-01-05 DIAGNOSIS — D6869 Other thrombophilia: Secondary | ICD-10-CM

## 2023-01-05 DIAGNOSIS — K5100 Ulcerative (chronic) pancolitis without complications: Secondary | ICD-10-CM | POA: Diagnosis not present

## 2023-01-05 DIAGNOSIS — I480 Paroxysmal atrial fibrillation: Secondary | ICD-10-CM

## 2023-01-05 LAB — COMPREHENSIVE METABOLIC PANEL
ALT: 10 U/L (ref 0–53)
AST: 13 U/L (ref 0–37)
Albumin: 4.6 g/dL (ref 3.5–5.2)
Alkaline Phosphatase: 69 U/L (ref 39–117)
BUN: 16 mg/dL (ref 6–23)
CO2: 30 mEq/L (ref 19–32)
Calcium: 9.3 mg/dL (ref 8.4–10.5)
Chloride: 106 mEq/L (ref 96–112)
Creatinine, Ser: 0.99 mg/dL (ref 0.40–1.50)
GFR: 75.49 mL/min (ref >60.00)
Glucose, Bld: 89 mg/dL (ref 70–99)
Potassium: 4.1 mEq/L (ref 3.5–5.1)
Sodium: 142 mEq/L (ref 135–145)
Total Bilirubin: 1.1 mg/dL (ref 0.2–1.2)
Total Protein: 6.4 g/dL (ref 6.0–8.3)

## 2023-01-05 LAB — LIPID PANEL
Cholesterol: 110 mg/dL (ref 0–200)
HDL: 45.6 mg/dL (ref >39.00)
LDL Cholesterol: 47 mg/dL (ref 0–99)
NonHDL: 64.17
Total CHOL/HDL Ratio: 2
Triglycerides: 84 mg/dL (ref 0.0–149.0)
VLDL: 16.8 mg/dL (ref 0.0–40.0)

## 2023-01-05 LAB — CBC WITH DIFFERENTIAL/PLATELET
Basophils Absolute: 0 10*3/uL (ref 0.0–0.1)
Basophils Relative: 0.5 % (ref 0.0–3.0)
Eosinophils Absolute: 0.1 10*3/uL (ref 0.0–0.7)
Eosinophils Relative: 1.3 % (ref 0.0–5.0)
HCT: 42 % (ref 39.0–52.0)
Hemoglobin: 14.3 g/dL (ref 13.0–17.0)
Lymphocytes Relative: 17.4 % (ref 12.0–46.0)
Lymphs Abs: 0.8 10*3/uL (ref 0.7–4.0)
MCHC: 34.1 g/dL (ref 30.0–36.0)
MCV: 90.4 fl (ref 78.0–100.0)
Monocytes Absolute: 0.4 10*3/uL (ref 0.1–1.0)
Monocytes Relative: 9.5 % (ref 3.0–12.0)
Neutro Abs: 3.3 10*3/uL (ref 1.4–7.7)
Neutrophils Relative %: 71.3 % (ref 43.0–77.0)
Platelets: 137 10*3/uL — ABNORMAL LOW (ref 150.0–400.0)
RBC: 4.65 Mil/uL (ref 4.22–5.81)
RDW: 14.4 % (ref 11.5–15.5)
WBC: 4.7 10*3/uL (ref 4.0–10.5)

## 2023-01-05 LAB — URIC ACID: Uric Acid, Serum: 6.1 mg/dL (ref 4.0–7.8)

## 2023-01-05 LAB — PSA: PSA: 2.92 ng/mL (ref 0.10–4.00)

## 2023-01-05 NOTE — Progress Notes (Signed)
Phone: [phone]   Subjective:  Patient presents today for their annual physical. Chief complaint-noted.   See problem oriented charting- ROS- full  review of systems was completed and negative  except for: hearing loss with aides, runny nose at times, pauses in breathing with OSA, mild orthostatic symptoms if stands quickly- if rises slowly- no issues  The following were reviewed and entered/updated in epic: Past Medical History:  Diagnosis Date   Chicken pox    Colitis, ulcerative ([place])    Hypertension    Macular degeneration, bilateral 07/2012   areds 2    Psoriasis    Patient Active Problem List   Diagnosis Date Noted   Paroxysmal atrial fibrillation ([place]) 04/21/2022    Priority: High   Secondary hypercoagulable state ([place]) 04/21/2022    Priority: High   nonobstructive CAD (coronary artery disease)- CAC 247 on 05/03/2018  12/16/2021    Priority: High   Abdominal aortic aneurysm (AAA) without rupture, unspecified part ([place])     Priority: High   Ulcerative colitis ([place]) 08/20/2007    Priority: High   Former smoker 04/19/2018    Priority: Medium    Hyperlipidemia 04/19/2018    Priority: Medium    Vertigo 12/19/2017    Priority: Medium    Gout 03/24/2014    Priority: Medium    Elevated PSA, less than 10 ng/ml 09/09/2013    Priority: Medium    Macular degeneration, bilateral 07/15/2012    Priority: Medium    Obesity (BMI 30.0-34.9) 12/02/2008    Priority: Medium    Essential hypertension 08/20/2007    Priority: Medium    PSORIASIS 08/20/2007    Priority: Medium    Onychomycosis 12/19/2017    Priority: Low   Trochanteric bursitis of left hip 03/31/2015    Priority: Low   Past Surgical History:  Procedure Laterality Date   ACNE CYST REMOVAL     CATARACT EXTRACTION W/ INTRAOCULAR LENS IMPLANT  10/2014   bilat   COLONOSCOPY     FOOT SURGERY  1988   LEFT HEART CATH AND CORONARY ANGIOGRAPHY N/A 07/22/2022   Procedure: LEFT HEART CATH AND CORONARY ANGIOGRAPHY;   Surgeon: Belva Crome, MD;  Location: [facility];  Service: Cardiovascular;  Laterality: N/A;   SHOULDER ARTHROSCOPY  2007   Right   TONSILLECTOMY      Family History  Problem Relation Age of Onset   Heart disease Mother    Breast cancer Mother    Hypertension Mother    Stroke Mother    Hypothyroidism Mother    Dementia Mother    Heart disease Father        CAD/MI 1s or 65s smoker. led to death age 78.    Hypertension Father    Diabetes Daughter    Diabetes Maternal Uncle    Hepatitis C Brother    Stroke Maternal Grandmother    Emphysema Maternal Grandfather    Arthritis Paternal Grandmother    Colon cancer Neg Hx    Esophageal cancer Neg Hx    Stomach cancer Neg Hx    Rectal cancer Neg Hx     Medications- reviewed and updated Current Outpatient Medications  Medication Sig Dispense Refill   acetaminophen (TYLENOL) 650 MG CR tablet Take 650 mg by mouth every 8 (eight) hours as needed for pain.     aflibercept (EYLEA HD) 8 MG/0.07ML SOLN by Intravitreal route.     apixaban (ELIQUIS) 5 MG TABS tablet Take 1 tablet (5 mg total) by mouth  2 (two) times daily. 180 tablet 1   Ascorbic Acid (VITAMIN C PO) Take 1 tablet by mouth daily. 1000 mg     atorvastatin (LIPITOR) 20 MG tablet TAKE 1 TABLET BY MOUTH EVERY DAY 90 tablet 3   clobetasol ointment (TEMOVATE) [id number] % Apply 1 Application topically 2 (two) times daily as needed (irritation).     diltiazem (CARDIZEM) 30 MG tablet Take one tablet by mouth every 4 hrs as needed, Heart rate needs to be above 100, top number blood pressure above 100 45 tablet 1   diphenhydramine-acetaminophen (TYLENOL PM) 25-500 MG TABS tablet Take 1 tablet by mouth at bedtime as needed (pain/sleep). (Patient not taking: Reported on 01/05/2023)     isosorbide mononitrate (IMDUR) 30 MG 24 hr tablet Take 0.5 tablets (15 mg total) by mouth daily. 45 tablet 3   L-Lysine 1000 MG TABS Take 1,000 mg by mouth daily.     lisinopril (ZESTRIL) 10 MG tablet  Take 1 tablet (10 mg total) by mouth daily. 90 tablet 3   Multiple Vitamins-Minerals (PRESERVISION AREDS 2) CAPS Take 1 capsule by mouth 2 (two) times daily before lunch and supper.     nitroGLYCERIN (NITROSTAT) 0.4 MG SL tablet Place 1 tablet (0.4 mg total) under the tongue every 5 (five) minutes as needed. 25 tablet 3   Propylene Glycol (SYSTANE COMPLETE OP) Place 1 drop into both eyes 2 (two) times daily.     No current facility-administered medications for this visit.    Allergies-reviewed and updated Allergies  Allergen Reactions   Sulfonamide Derivatives     "have just been told that my entire life"    Social History   Social History Narrative   Married 1971. 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  [employer] years.       ACP: HCPOA - wife. Yes for acute CPR, no for prolonged mechanical ventilatory support, no for prolonged artificial nutrition or other heroic measures leaving him in an incapacitated state.    Objective  Objective:  BP 126/70 Comment: most recent home reading  Pulse (!) 56   Temp (!) 97.2 F (36.2 C)   Ht 5' 9"$  (1.753 m)   Wt 188 lb 3.2 oz (85.4 kg)   SpO2 98%   BMI 27.79 kg/m  Gen: NAD, resting comfortably HEENT: Mucous membranes are moist. Oropharynx normal Neck: no thyromegaly CV: RRR no murmurs rubs or gallops Lungs: CTAB no crackles, wheeze, rhonchi Abdomen: soft/nontender/nondistended/normal bowel sounds. No rebound or guarding.  Ext: no edema Skin: warm, dry Neuro: grossly normal, moves all extremities, PERRLA   Assessment and Plan  72 y.o. male presenting for annual physical.  Health Maintenance counseling: 1. Anticipatory guidance: Patient counseled regarding regular dental exams -q6 months, eye exams - regularly due to macular degeneration- sees Dr. Jule Ser for retina as well- out to 8 weeks,  avoiding smoking and  second hand smoke, limiting alcohol to 2 beverages per day - extremely rare intake- Christmas a year ago, no illicit drugs .   2. Risk factor reduction:  Advised patient of need for regular exercise and diet rich and fruits and vegetables to reduce risk of heart attack and stroke.  Exercise- playing golf, will get back on yardwork soon, considering going to [facility] under silver sneakers. Diet/weight management-has done a phenomenal job with weight loss-maintaining over the last year. Prior weight  271 he reports Wt Readings from Last 3 Encounters:  01/05/23 188 lb 3.2 oz (85.4 kg)  12/22/22 195 lb (88.5 kg)  12/20/22 190 lb 9.6 oz (86.5 kg)  3. Immunizations/screenings/ancillary studies - had covid shot in November 2023- up to date  Immunization History  Administered Date(s) Administered   Fluad Quad(high Dose 65+) 08/27/2019, 09/19/2020, 08/17/2022   Influenza Split 10/03/2011   Influenza Whole 08/29/2012   Influenza, High Dose Seasonal PF 08/17/2015, 08/17/2015, 09/01/2016, 08/22/2018, 08/27/2021   Influenza,inj,Quad PF,6+ Mos 09/05/2013, 09/08/2014, 09/19/2020   Influenza-Unspecified 08/17/2017, 08/28/2018   PFIZER Comirnaty(Gray Top)Covid-19 Tri-Sucrose Vaccine 05/28/2021, 10/01/2022   PFIZER(Purple Top)SARS-COV-2 Vaccination 01/07/2020, 01/28/2020, 08/25/2020   PNEUMOCOCCAL CONJUGATE-20 04/28/2021   Pfizer Covid-19 Vaccine Bivalent Booster 57yr & up 10/16/2021   Pneumococcal Conjugate-13 10/08/2013   Pneumococcal Polysaccharide-23 10/02/2009, 02/04/2015   Td 10/02/2009   Tdap 11/26/2019   Zoster Recombinat (Shingrix) 12/28/2018, 10/05/2019   Zoster, Live 12/04/2009  4. Prostate cancer screening- low risk prior PSA trend-he has wanted to continue to trend (good friend recently diagnosed).  Stable nocturia 2-3 x a night Lab Results  Component Value Date   PSA 2.68 12/16/2021   PSA 2.93 12/03/2020   PSA 2.56 11/20/2019   5. Colon cancer screening - 05/22/15 with 10 year repeat planned  with Dr. GCarlean Purl  6. Skin cancer screening- follows with Dr. WElvera Lennoxfor dermatology .  checks in may. advised regular sunscreen use. Denies worrisome, changing, or new skin lesions- slight redness on ankle improving with clobetasol - he will let uKoreaknow if fails toimprove.  7. Smoking associated screening (lung cancer screening, AAA screen 65-75, UA)- former smoker-quit smoking in 1990 .  Aneurysm screening picked up initial aneurysm and then later noted as only dilation without true aneurysm. We did opt to get UA at least every 5years- last 2022.   8. STD screening - not required as monogamous   Status of chronic or acute concerns   #OSA- Waking up and not feeling rested- CPAP was recommended but struggling with claustrophobia- first mask didn't work, did ok with a different one but woke up panicked during study and couldn't put it back on -they still want him to try it- he has follow up with Dr. KClaiborne Billingsto discuss options.   #Deviated septum- reports has had issues for years- harder to breath lately- wants ENT referral- send to Dr. bRedmond Baseman # Atrial fibrillation-new diagnosis 04/16/22 in ER #Secondary hypercoagulable state S: Rate controlled with diltiazem 30 mg only as needed for elevated heart rate/palpitations/heart racing - never taken Anticoagulated with Eliquis 5 mg twice daily  A/P: appropriately anticoagulated and rate controlled- continue current medicine   #hypertension S: medication: Lisinopril 10 mg once daily recommended by cardiology A/P: he has not made change- still on 20 mg but home blood pressures look great - no lows- would continue 20 mg - he will also chat with cardiology.   #[facility] heavily calcified 85% mid LAD stenosis; medical management recommended and consider PCI worsening symptoms.  #hyperlipidemia  S: Medication:Atorvastatin 20 mg once daily, eliquis due to a fib so off asa No chest pain or shortness of breath  Lab Results   Component Value Date   CHOL 103 12/16/2021   HDL 47.80 12/16/2021   LDLCALC 41 12/16/2021   TRIG 72.0 12/16/2021   CHOLHDL 2 12/16/2021  A/P: lipids looked great last check- continue current medications and update lipids CAD- asymptomatic continue current medications   #Gout- uric acid  has been at goal under 6 S: -patient did report he cut down on bacon in 2021 - wife helped to ration this and this seemed to help him A/P:no flares with bacon rationing- update uric acid and continue to monitor   # Aortic dilation/ aneurysm hx- Patient had this previously listed as aortic aneurysm - on repeat ultrasound in 2021 with no measurement over 3 cm - no aneurysm noted 12/30/21- we mentioned doing one more exam perhaps 2-5 years    #Vertigo/BPPV-sparing issues but not lately - still has meclizine 25 mg on hand if needed   #macular degeneration- close follow up with optho- 2024 on eylea HD   %Ulcerative colitis- remote issues - patient thinks was misdiagnosed- we planned to continue to monitor for recurrence. Years of remission if this was a true diagnosis - no abdominal pain or bright red blood per rectum   #mild thrombocytopenia- chronic stable, other cell lines ok- trend at least annually  Lab Results  Component Value Date   WBC 4.0 07/15/2022   HGB 14.2 07/15/2022   HCT 42.3 07/15/2022   MCV 86 07/15/2022   PLT 144 (L) 07/15/2022   Recommended follow up: Return in about 6 months (around 07/06/2023) for followup or sooner if needed.Schedule b4 you leave. Future Appointments  Date Time Provider [place]  01/06/2023  1:30 PM Troy Sine, MD [facility] None  02/03/2023  2:45 PM [facility] HEALTH COACH [facility]   Lab/Order associations: fasting   ICD-10-CM   1. Preventative health care  Z00.00     2. Abdominal aortic aneurysm (AAA) without rupture, unspecified part ([place])  I71.40     3. Paroxysmal atrial fibrillation ([facility])  I48.0     4. Secondary hypercoagulable state  ([place])  D68.69     5. Deviated septum  J34.2     6. Ulcerative pancolitis without complication ([place])  123XX123     7. Thrombocytopenia ([place])  D69.6     8. Coronary artery disease involving native coronary artery of native heart without angina pectoris  I25.10     9. Hyperlipidemia, unspecified hyperlipidemia type  E78.5     10. Nocturia  R35.1     11. Idiopathic gout, unspecified chronicity, unspecified site  M10.00       No orders of the defined types were placed in this encounter.   Return precautions advised.  Garret Reddish, MD

## 2023-01-05 NOTE — Patient Instructions (Addendum)
You are eligible to schedule your annual wellness visit with our nurse specialist Otila Kluver.  Please consider scheduling this before you leave today  Get signed back up with the [facility] especially if you have gym coverage with insurance! Would recommend 150 minutes a week.   Please stop by lab before you go If you have mychart- we will send your results within 3 business days of [place] receiving them.  If you do not have mychart- we will call you about results within 5 business days of [place] receiving them.  *please also note that you will see labs on mychart as soon as they post. I will later go in and write notes on them- will say "notes from Dr. Yong Channel"   We will either call you (or see alternate below) within two weeks about your referral to ENT . Our referral specialist will sometimes also send you a mychart link once referral is approved and then you will call the # listed on there (let us know if you do not see this within 2 weeks or have not received call)  Recommended follow up: Return in about 6 months (around 07/06/2023) for followup or sooner if needed.Schedule b4 you leave.

## 2023-01-06 ENCOUNTER — Ambulatory Visit: Payer: PPO | Attending: Cardiovascular Disease | Admitting: Cardiovascular Disease

## 2023-01-06 ENCOUNTER — Encounter: Payer: Self-pay | Admitting: Cardiovascular Disease

## 2023-01-06 DIAGNOSIS — I10 Essential (primary) hypertension: Secondary | ICD-10-CM | POA: Diagnosis not present

## 2023-01-06 DIAGNOSIS — D6869 Other thrombophilia: Secondary | ICD-10-CM | POA: Diagnosis not present

## 2023-01-06 DIAGNOSIS — I480 Paroxysmal atrial fibrillation: Secondary | ICD-10-CM | POA: Diagnosis not present

## 2023-01-06 DIAGNOSIS — G4736 Sleep related hypoventilation in conditions classified elsewhere: Secondary | ICD-10-CM | POA: Diagnosis not present

## 2023-01-06 DIAGNOSIS — I2510 Atherosclerotic heart disease of native coronary artery without angina pectoris: Secondary | ICD-10-CM | POA: Diagnosis not present

## 2023-01-06 DIAGNOSIS — J342 Deviated nasal septum: Secondary | ICD-10-CM

## 2023-01-06 DIAGNOSIS — G4733 Obstructive sleep apnea (adult) (pediatric): Secondary | ICD-10-CM | POA: Diagnosis not present

## 2023-01-06 NOTE — Patient Instructions (Addendum)
Medication Instructions:   No changes    Lab Work: Not need   Testing/Procedures: Not needed   Follow-Up: At [facility], you and your health needs are our priority.  As part of our continuing mission to provide you with exceptional heart care, we have created designated Provider Care Teams.  These Care Teams include your primary Cardiologist (physician) and Advanced Practice Providers (APPs -  Physician Assistants and Nurse Practitioners) who all work together to provide you with the care you need, when you need it.     Your next appointment:   5 month(s)  The format for your next appointment:   In Person  Provider:   Dr Shelva Majestic     Other Instructions  Please contact office once a decision is made in regards to using a C-PAP

## 2023-01-06 NOTE — Progress Notes (Unsigned)
Cardiology Office Note    Date:  01/10/2023   ID:  Edward Goodwin, Edward Goodwin, MRN [id number]  PCP:  Edward Olp, MD  Cardiologist:  Edward Majestic, MD (sleep); Dr. Oswaldo Goodwin  New sleep consultation and evaluation initially referred for sleep study by Edward So, PA .   History of Present Illness:  Edward Goodwin is a 72 y.o. male who is followed by Edward Goodwin for primary care.  He has a history of mild hearing loss with hearing aids, and has issues with a deviated nasal septum.  He has a history of CAD with a heavily calcified LAD stenosis on medical management, hypertension, psoriasis, ulcerative colitis and paroxysmal atrial fibrillation.  He was diagnosed with atrial fibrillation at an ER visit on April 16, 2022.  His CHA2DS2-VASc score was 3 and he was started on Eliquis 5 mg twice a day in addition to as needed diltiazem.  He was evaluated by Edward So PA in atrial fibrillation clinic and was referred to Dr. Oswaldo Goodwin for cardiology care.  Due to concerns for obstructive sleep apnea he was referred for a diagnostic polysomnogram which was done on October 11, 2022.  On his home study, his AHI was 9.6/h with RDI 10.6/h consistent with mild overall sleep apnea.  However, during REM sleep, sleep apnea/severe with an AHI of 31.5 and with supine sleep was even more severe with an AHI of 60.6/h.  He dropped his oxygen saturation to a nadir of 79% and he was noted to have loud snoring throughout the study.  He was referred for a CPAP titration trial on December 22, 2022.  The study was of voided midstream after he was only titrated to 7 cm of water.  Apparently he has significant issues with deviated septum and felt claustrophobic with his mask on.  He apparently had only tried a fullface mask over the nose.  It was recommended that he initiate CPAP auto therapy with an EPR of 3 at 7 to 14 cm of water with heated humidification.  He had been contacted by  [facility] to contact them to obtain a new machine.  However, he did not want to get a machine until he had an opportunity to see me in sleep clinic for evaluation.  He is here with his wife today in the office.  He typically goes to bed between 8 and 10 PM and wakes up at 6 AM.  States his sleep is not very good.  He has had issues with significant right deviated septum.  He has not been evaluated by an ENT doctor.  Presently, he continues to be on Eliquis for anticoagulation.  He is on isosorbide 15 mg and lisinopril 10 mg daily in addition to as needed diltiazem.  He is on atorvastatin 20 mg for hyperlipidemia.  He presents for initial sleep consultation and evaluation.   Past Medical History:  Diagnosis Date   Chicken pox    Colitis, ulcerative ([place])    Hypertension    Macular degeneration, bilateral 07/2012   areds 2    Psoriasis     Past Surgical History:  Procedure Laterality Date   ACNE CYST REMOVAL     CATARACT EXTRACTION W/ INTRAOCULAR LENS IMPLANT  10/2014   bilat   COLONOSCOPY     FOOT SURGERY  1988   LEFT HEART CATH AND CORONARY ANGIOGRAPHY N/A 07/22/2022   Procedure: LEFT HEART CATH AND CORONARY ANGIOGRAPHY;  Surgeon: Edward Goodwin,  Edward Dike, MD;  Location: [facility];  Service: Cardiovascular;  Laterality: N/A;   SHOULDER ARTHROSCOPY  2007   Right   TONSILLECTOMY      Current Medications: Outpatient Medications Prior to Visit  Medication Sig Dispense Refill   acetaminophen (TYLENOL) 650 MG CR tablet Take 650 mg by mouth every 8 (eight) hours as needed for pain.     aflibercept (EYLEA HD) 8 MG/0.07ML SOLN by Intravitreal route.     apixaban (ELIQUIS) 5 MG TABS tablet Take 1 tablet (5 mg total) by mouth 2 (two) times daily. 180 tablet 1   Ascorbic Acid (VITAMIN C PO) Take 1 tablet by mouth daily. 1000 mg     atorvastatin (LIPITOR) 20 MG tablet TAKE 1 TABLET BY MOUTH EVERY DAY 90 tablet 3   clobetasol ointment (TEMOVATE) [id number] % Apply 1 Application topically 2 (two) times  daily as needed (irritation).     diltiazem (CARDIZEM) 30 MG tablet Take one tablet by mouth every 4 hrs as needed, Heart rate needs to be above 100, top number blood pressure above 100 45 tablet 1   diphenhydramine-acetaminophen (TYLENOL PM) 25-500 MG TABS tablet Take 1 tablet by mouth at bedtime as needed (pain/sleep).     isosorbide mononitrate (IMDUR) 30 MG 24 hr tablet Take 0.5 tablets (15 mg total) by mouth daily. 45 tablet 3   L-Lysine 1000 MG TABS Take 1,000 mg by mouth daily.     lisinopril (ZESTRIL) 10 MG tablet Take 1 tablet (10 mg total) by mouth daily. (Patient taking differently: Take 20 mg by mouth daily.) 90 tablet 3   Multiple Vitamins-Minerals (PRESERVISION AREDS 2) CAPS Take 1 capsule by mouth 2 (two) times daily before lunch and supper.     nitroGLYCERIN (NITROSTAT) 0.4 MG SL tablet Place 1 tablet (0.4 mg total) under the tongue every 5 (five) minutes as needed. 25 tablet 3   Propylene Glycol (SYSTANE COMPLETE OP) Place 1 drop into both eyes 2 (two) times daily.     No facility-administered medications prior to visit.     Allergies:   Sulfonamide derivatives   Social History   Socioeconomic History   Marital status: Married    Spouse name: Not on file   Number of children: 1   Years of education: 12   Highest education level: Not on file  Occupational History   Occupation: Herbalist    Comment: retired   Tobacco Use   Smoking status: Former    Types: Cigarettes    Quit date: 10/23/1989    Years since quitting: 33.2   Smokeless tobacco: Never  Vaping Use   Vaping Use: Never used  Substance and Sexual Activity   Alcohol use: Yes    Alcohol/week: 0.0 standard drinks of alcohol    Comment: rare glass of wine - less than once a year   Drug use: No   Sexual activity: Not Currently  Other Topics Concern   Not on file  Social History Narrative   Married 1971. 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 [employer] 6 years.       ACP: HCPOA - wife. Yes for acute CPR, no for prolonged mechanical ventilatory support, no for prolonged artificial nutrition or other heroic measures leaving him in an incapacitated state.    Social Determinants of Health   Financial Resource Strain:  Low Risk  (01/27/2022)   Overall Financial Resource Strain (CARDIA)    Difficulty of Paying Living Expenses: Not hard at all  Food Insecurity: No Food Insecurity (01/27/2022)   Hunger Vital Sign    Worried About Running Out of Food in the Last Year: Never true    Ran Out of Food in the Last Year: Never true  Transportation Needs: No Transportation Needs (01/27/2022)   PRAPARE - Hydrologist (Medical): No    Lack of Transportation (Non-Medical): No  Physical Activity: Sufficiently Active (01/27/2022)   Exercise Vital Sign    Days of Exercise per Week: 2 days    Minutes of Exercise per Session: 150+ min  Stress: No Stress Concern Present (01/27/2022)   [place]    Feeling of Stress : Not at all  Social Connections: Moderately Integrated (01/27/2022)   Social Connection and Isolation Panel [NHANES]    Frequency of Communication with Friends and Family: More than three times a week    Frequency of Social Gatherings with Friends and Family: More than three times a week    Attends Religious Services: More than 4 times per year    Active Member of [employer] or Organizations: No    Attends Music therapist: Never    Marital Status: Married     Family History:  The patient's family history includes Arthritis in his paternal grandmother; Breast cancer in his mother; Dementia in his mother; Diabetes in his daughter and maternal uncle; Emphysema in his maternal grandfather; Heart disease in his father and mother; Hepatitis C in his brother; Hypertension in his  father and mother; Hypothyroidism in his mother; Stroke in his maternal grandmother and mother.   ROS General: Negative; No fevers, chills, or night sweats;  HEENT: Negative; No changes in vision or hearing, sinus congestion, difficulty swallowing Pulmonary: Negative; No cough, wheezing, shortness of breath, hemoptysis Cardiovascular: Negative; No chest pain, presyncope, syncope, palpitations GI: Negative; No nausea, vomiting, diarrhea, or abdominal pain GU: Negative; No dysuria, hematuria, or difficulty voiding Musculoskeletal: Negative; no myalgias, joint pain, or weakness Hematologic/Oncology: Negative; no easy bruising, bleeding Endocrine: Negative; no heat/cold intolerance; no diabetes Neuro: Negative; no changes in balance, headaches Skin: Negative; No rashes or skin lesions Psychiatric: Negative; No behavioral problems, depression Sleep: See HPI: Positive for mild overall sleep apnea which is severe during REM sleep in the supine position.  Positive for snoring, an Epworth Sleepiness Scale score was calculated in the office today and this endorsed at 3 arguing against residual daytime sleepiness.   No bruxism, restless legs, hypnogognic hallucinations, no cataplexy Other comprehensive 14 point system review is negative.   PHYSICAL EXAM:   VS:  BP (!) 118/56 (BP Location: Left Arm, Patient Position: Sitting, Cuff Size: Normal)   Pulse 63   Ht '5\' 9"'$  (1.753 m)   Wt 190 lb 3.2 oz (86.3 kg)   SpO2 97%   BMI 28.09 kg/m     Repeat blood pressure by me was 116/66.  Wt Readings from Last 3 Encounters:  01/06/23 190 lb 3.2 oz (86.3 kg)  01/05/23 188 lb 3.2 oz (85.4 kg)  12/22/22 195 lb (88.5 kg)    General: Alert, oriented, no distress.  Skin: normal turgor, no rashes, warm and dry HEENT: Normocephalic, atraumatic. Pupils equal round and reactive to light; sclera anicteric; extraocular muscles intact;  Nose without nasal septal hypertrophy Mouth/Parynx benign; Mallinpatti scale  3 Neck: No JVD,  no carotid bruits; normal carotid upstroke Lungs: clear to ausculatation and percussion; no wheezing or rales Chest wall: without tenderness to palpitation Heart: PMI not displaced, RRR, s1 s2 normal, 1/6 systolic murmur, no diastolic murmur, no rubs, gallops, thrills, or heaves Abdomen: soft, nontender; no hepatosplenomehaly, BS+; abdominal aorta nontender and not dilated by palpation. Back: no CVA tenderness Pulses 2+ Musculoskeletal: full range of motion, normal strength, no joint deformities Extremities: no clubbing cyanosis or edema, Homan's sign negative  Neurologic: grossly nonfocal; Cranial nerves grossly wnl Psychologic: Normal mood and affect   Studies/Labs Reviewed:   January 06, 2023 ECG (independently read by me): NSR at 63, no ectopy  Recent Labs:    Latest Ref Rng & Units 01/05/2023    8:50 AM 07/15/2022    9:12 AM 06/20/2022    2:37 PM  BMP  Glucose 70 - 99 mg/dL 89  78  90   BUN 6 - 23 mg/dL '16  18  19   '$ Creatinine 0.40 - 1.50 mg/dL 0.99  0.98  1.05   BUN/Creat Ratio 10 - '24  18  18   '$ Sodium 135 - 145 mEq/L 142  139  140   Potassium 3.5 - 5.1 mEq/L 4.1  4.6  4.5   Chloride 96 - 112 mEq/L 106  102  101   CO2 19 - 32 mEq/L '30  25  25   '$ Calcium 8.4 - 10.5 mg/dL 9.3  9.4  9.1         Latest Ref Rng & Units 01/05/2023    8:50 AM 04/16/2022    8:07 AM 12/16/2021   10:31 AM  Hepatic Function  Total Protein 6.0 - 8.3 g/dL 6.4  6.8  6.5   Albumin 3.5 - 5.2 g/dL 4.6  4.3  4.5   AST 0 - 37 U/L '13  12  13   '$ ALT 0 - 53 U/L '10  9  8   '$ Alk Phosphatase 39 - 117 U/L 69  55  66   Total Bilirubin 0.2 - 1.2 mg/dL 1.1  0.9  1.0        Latest Ref Rng & Units 01/05/2023    8:50 AM 07/15/2022    9:12 AM 06/15/2022    9:39 AM  CBC  WBC 4.0 - 10.5 K/uL 4.7  4.0  4.6   Hemoglobin 13.0 - 17.0 g/dL 14.3  14.2  13.1   Hematocrit 39.0 - 52.0 % 42.0  42.3  39.2   Platelets 150.0 - 400.0 K/uL 137.0  144  129.0    Lab Results  Component Value Date   MCV 90.4  01/05/2023   MCV 86 07/15/2022   MCV 85.4 06/15/2022   Lab Results  Component Value Date   TSH 1.15 06/15/2022   Lab Results  Component Value Date   HGBA1C 5.0 09/05/2013     BNP    Component Value Date/Time   BNP 402.8 (H) 04/16/2022 0807    ProBNP No results found for: "PROBNP"   Lipid Panel     Component Value Date/Time   CHOL 110 01/05/2023 0850   TRIG 84.0 01/05/2023 0850   TRIG 58 09/01/2006 0749   HDL 45.60 01/05/2023 0850   CHOLHDL 2 01/05/2023 0850   VLDL 16.8 01/05/2023 0850   LDLCALC 47 01/05/2023 0850     RADIOLOGY: SLEEP STUDY DOCUMENTS  Result Date: 12/29/2022 Ordered by an unspecified provider.    Additional studies/ records that were reviewed today include:    Patient Name:  Lilli Few Study Date: 10/11/2022 Gender: Male D.O.B: [birth year] Goodwin Age (years): 54 Referring Provider: Malka So PA Height (inches): 69 Interpreting Physician: Edward Majestic MD, ABSM Weight (lbs): 195 RPSGT: Gwenyth Allegra BMI: 29 MRN: [id number] Neck Size: 16.00   CLINICAL INFORMATION Sleep Study Type: NPSG   Indication for sleep study: Hypertension, CAD, PAF, snoring   Epworth Sleepiness Score: 3   SLEEP STUDY TECHNIQUE As per the AASM Manual for the Scoring of Sleep and Associated Events v2.3 (April 2016) with a hypopnea requiring 4% desaturations.   The channels recorded and monitored were frontal, central and occipital EEG, electrooculogram (EOG), submentalis EMG (chin), nasal and oral airflow, thoracic and abdominal wall motion, anterior tibialis EMG, snore microphone, electrocardiogram, and pulse oximetry.   MEDICATIONS acetaminophen (TYLENOL) 650 MG CR tablet apixaban (ELIQUIS) 5 MG TABS tablet Ascorbic Acid (VITAMIN C PO) aspirin EC 81 MG tablet atorvastatin (LIPITOR) 20 MG tablet clobetasol ointment (TEMOVATE) 0.05 % diltiazem (CARDIZEM) 30 MG tablet diphenhydramine-acetaminophen (TYLENOL PM) 25-500 MG TABS tablet faricimab-svoa (VABYSMO) 6  MG/0.05ML SOLN intravitreal injection isosorbide mononitrate (IMDUR) 30 MG 24 hr tablet L-Lysine 1000 MG TABS lisinopril (ZESTRIL) 20 MG tablet Multiple Vitamins-Minerals (PRESERVISION AREDS 2) CAPS nitroGLYCERIN (NITROSTAT) 0.4 MG SL tablet (Expired) Propylene Glycol (SYSTANE COMPLETE OP) Medications self-administered by patient taken the night of the study : TYLENOL PM   SLEEP ARCHITECTURE The study was initiated at 9:53:22 PM and ended at 4:21:24 AM.   Sleep onset time was 7.8 minutes and the sleep efficiency was 82.0%. The total sleep time was 318.2 minutes.   Stage REM latency was 60.0 minutes.   The patient spent 5.7% of the night in stage N1 sleep, 66.8% in stage N2 sleep, 0.0% in stage N3 and 27.5% in REM.   Alpha intrusion was absent.   Supine sleep was 14.93%.   RESPIRATORY PARAMETERS The overall apnea/hypopnea index (AHI) was 9.6 per hour. The respiratory disturbance index (RDI) was 10.6/h. There were 24 total apneas, including 23 obstructive, 1 central and 0 mixed apneas. There were 27 hypopneas and 5 RERAs.   The AHI during Stage REM sleep was 31.5 per hour.   AHI while supine was 60.6 per hour.   The mean oxygen saturation was 93.3%. The minimum SpO2 during sleep was 79.0%.   Loud snoring was noted during this study.   CARDIAC DATA The 2 lead EKG demonstrated sinus rhythm. The mean heart rate was 50.8 beats per minute. Other EKG findings include: None.   LEG MOVEMENT DATA The total PLMS were 0 with a resulting PLMS index of 0.0. Associated arousal with leg movement index was 0.8 .   IMPRESSIONS - Mild obstructive sleep apnea overall (AHI 9.6/h; RDI 10.6/h): however, sleep apnea was severe during REM sleep (AHI 31.5/h) and very severe with supine sleep (AHI 60.6/h). - No significant central sleep apnea occurred during this study (CAI 0.2/h). - Significant oxygen desaturation to a nadir of 79.0%. - The patient snored with loud snoring volume. - No cardiac  abnormalities were noted during this study. - Clinically significant periodic limb movements did not occur during sleep. No significant associated arousals. - DIAGNOSIS - Obstructive Sleep Apnea (G47.33) - Nocturnal Hypoxemia (G47.36)   RECOMMENDATIONS - Therapeutic CPAP titration to determine optimal pressure required to alleviate sleep disordered breathing. In this patient with cardiovascular comorbidities recommend an in-lab CPAP titration; if unable, then initiate AUto PAP with EPR of 3 at 6 - 18 cm of water. - Effort should be made to otpimize nasal and oropharyngeal  patency. - Positional therapy avoiding supine position during sleep. - Avoid alcohol, sedatives and other CNS depressants that may worsen sleep apnea and disrupt normal sleep architecture. - Sleep hygiene should be reviewed to assess factors that may improve sleep quality. - Weight management and regular exercise should be initiated or continued if appropriate. - Recommend a download and sleep clinic evaluation after 4 weeks of therapy.    CPAP TITRATION: 12/22/2022 CLINICAL INFORMATION The patient is referred for a CPAP titration to treat sleep apnea.   Date of NPSG: 10/11/2022:  AHI 9.6/h; RDI 10.6/h; REM AHI 31.5/h; supine AHI 60.6/h; O2 nadir 79%.   SLEEP STUDY TECHNIQUE As per the AASM Manual for the Scoring of Sleep and Associated Events v2.3 (April 2016) with a hypopnea requiring 4% desaturations.   The channels recorded and monitored were frontal, central and occipital EEG, electrooculogram (EOG), submentalis EMG (chin), nasal and oral airflow, thoracic and abdominal wall motion, anterior tibialis EMG, snore microphone, electrocardiogram, and pulse oximetry. Continuous positive airway pressure (CPAP) was initiated at the beginning of the study and titrated to treat sleep-disordered breathing.   MEDICATIONS acetaminophen (TYLENOL) 650 MG CR tablet aflibercept (EYLEA HD) 8 MG/0.07ML SOLN apixaban (ELIQUIS) 5 MG  TABS tablet Ascorbic Acid (VITAMIN C PO) atorvastatin (LIPITOR) 20 MG tablet clobetasol ointment (TEMOVATE) 0.05  diltiazem (CARDIZEM) 30 MG tablet diphenhydramine-acetaminophen (TYLENOL PM) 25-500 MG TABS tablet faricimab-svoa (VABYSMO) 6 MG/0.05ML SOLN intravitreal injection isosorbide mononitrate (IMDUR) 30 MG 24 hr tablet L-Lysine 1000 MG TAB lisinopril (ZESTRIL) 10 MG tablet Multiple Vitamins-Minerals (PRESERVISION AREDS 2) CAP nitroGLYCERIN (NITROSTAT) 0.4 MG SL tablet Propylene Glycol (SYSTANE COMPLETE OP) Medications self-administered by patient taken the night of the study : TYLENOL PM   TECHNICIAN COMMENTS Comments added by technician: Patient had difficulty initiating sleep. Patient could not tolerate CPAP. Study was inconclusive Comments added by scorer: N/A   RESPIRATORY PARAMETERS Optimal PAP Pressure (cm):  7          AHI at Optimal Pressure (/hr):            2.1 Overall Minimal O2 (%):         85.0     Supine % at Optimal Pressure (%):    18 Minimal O2 at Optimal Pressure (%): 85.0        SLEEP ARCHITECTURE The study was initiated at 10:58:18 PM and ended at 3:29:08 AM.   Sleep onset time was 0.5 minutes and the sleep efficiency was 83.3%. The total sleep time was 225.5 minutes.   The patient spent 1.8% of the night in stage N1 sleep, 91.4% in stage N2 sleep, 0.0% in stage N3 and 6.9% in REM.Stage REM latency was 150.0 minutes   Wake after sleep onset was 44.8. Alpha intrusion was absent. Supine sleep was 10.28%.   CARDIAC DATA The 2 lead EKG demonstrated sinus rhythm. The mean heart rate was 95.1 beats per minute. Other EKG findings include: None.   LEG MOVEMENT DATA The total Periodic Limb Movements of Sleep (PLMS) were 0. The PLMS index was 0.0. A PLMS index of <15 is considered normal in adults.   IMPRESSIONS - CPAP was initiated at 5 cm and was titrated to only 7 cm of water. (AHI 2.1/h; O2 nadir 85%) - Moderate oxygen desaturations were observed during  this titration to a nadir of 85.0% at 7 cm. - No snoring was audible during this study. - No cardiac abnormalities were observed during this study. - Clinically significant periodic limb movements were not noted  during this study. Arousals associated with PLMs were rare.   DIAGNOSIS - Obstructive Sleep Apnea (G47.33)   RECOMMENDATIONS - Recommend an initial trial of CPAP Auto therapy with EPR of 3 at 7 - 14 cm H2O with heated humidification. A large size Fisher&Paykel Full Face Simplus mask was used for the titration. - Effort should be made to optimize nasal and orophayngeal patency. - Avoid alcohol, sedatives and other CNS depressants that may worsen sleep apnea and disrupt normal sleep architecture. - Sleep hygiene should be reviewed to assess factors that may improve sleep quality. - Weight management and regular exercise should be initiated or continued. - Recommend a download and sleep clinic evaluation after 4 weeks of therapy.    ASSESSMENT:    1. Paroxysmal atrial fibrillation (HCC)   2. Obstructive sleep apnea (adult) (pediatric)   3. Essential hypertension   4. Hypoxemia associated with sleep   5. Secondary hypercoagulable state ([place])   6. Deviated septum   7. CAD in native artery     PLAN:  Mr. Rani Bybee is a 72 year old gentleman who has a history of CAD, hypertension, and recently had developed an episode of atrial fibrillation leading to an ER at [facility] and on April 16, 2022.  In addition he has a history of ulcerative colitis, psoriasis, and has been documented to have significant deviated septum.  Following his episode of atrial fibrillation, he is referred for a sleep study by Edward So, PA in the atrial fibrillation clinic.  I had an extensive discussion with him today and reviewed his initial home study which showed mild overall sleep apnea; however sleep apnea was severe and REM sleep and very severe in the supine position.  He recently tried to undergo CPAP titration.   However, the study had to be aborted midway through due to his difficulty with claustrophobia with the fullface mask and he felt as if he was not getting adequate oxygenation.  I had a lengthy discussion with him today regarding sleep apnea and its potential effects on normal sleep architecture and potential adverse cardiovascular consequences is left untreated.  I reviewed with him data with reference to its implications and hypertension, development of potential nocturnal arrhythmias, increased risk for atrial fibrillation and particularly increased risk of developing recurrent AF if sleep apnea is untreated following successful cardioversion.  In addition I discussed potential negative adverse effects with reference to insulin resistance, increased risk for nocturnal GERD as well as increased inflammatory markers.  Particularly with his coronary artery disease, we discussed potential for nocturnal hypoxemia contributing to nocturnal ischemia.  I believe some of his difficulty was using the fullface mask.  I presented him different mask options today.  However, a big concern is that he has apparently significant deviated septum with significantly reduced nasal aperture.  He is hesitant to initiate CPAP therapy due to his claustrophobia and breathing difficulties.  He has been referred to Dr. Redmond Baseman for ENT evaluation and I encouraged evaluation particularly if he is in need for repair of his deviated septum and turbinate reduction.  We can then potentially consider a follow-up evaluation or initiation of CPAP therapy if he feels he can breathe more appropriately and have less potential claustrophobic risk.  We also discussed alternative treatment modalities.  I answered all his questions and will see him in follow-up of his ENT evaluation and treatment as indicated.  Medication Adjustments/Labs and Tests Ordered: Current medicines are reviewed at length with the patient today.  Concerns regarding medicines are  outlined above.  Medication changes, Labs and Tests ordered today are listed in the Patient Instructions below. Patient Instructions  Medication Instructions:   No changes    Lab Work: Not need   Testing/Procedures: Not needed   Follow-Up: At [facility], you and your health needs are our priority.  As part of our continuing mission to provide you with exceptional heart care, we have created designated Provider Care Teams.  These Care Teams include your primary Cardiologist (physician) and Advanced Practice Providers (APPs -  Physician Assistants and Nurse Practitioners) who all work together to provide you with the care you need, when you need it.     Your next appointment:   5 month(s)  The format for your next appointment:   In Person  Provider:   Dr Edward Goodwin     Other Instructions  Please contact office once a decision is made in regards to using a C-PAP     Signed, Edward Majestic, MD  01/10/2023 4:15 PM    [place] [address] [place], [place], [place]  [address] Phone: [phone]

## 2023-01-09 ENCOUNTER — Encounter: Payer: Self-pay | Admitting: Family Medicine

## 2023-01-10 ENCOUNTER — Encounter: Payer: Self-pay | Admitting: Cardiovascular Disease

## 2023-01-18 ENCOUNTER — Encounter: Payer: Self-pay | Admitting: *Deleted

## 2023-01-18 MED ORDER — LISINOPRIL 20 MG PO TABS: 20.0000 mg | ORAL_TABLET | Freq: Every day | ORAL | 3 refills | Status: DC

## 2023-01-24 ENCOUNTER — Telehealth: Payer: Self-pay | Admitting: Family Medicine

## 2023-01-24 NOTE — Telephone Encounter (Signed)
Copied from [place] [id number]. Topic: Medicare AWV >> Jan 24, 2023  1:28 PM Gillis Santa wrote: Reason for CRM: Called patient to schedule Medicare Annual Wellness Visit (AWV). Left message for patient to call back and schedule Medicare Annual Wellness Visit (AWV).  Last date of AWV: 01/27/2022  Please schedule an appointment at any time with Otila Klu[facility], [facility]. Patient states he will call back to r/s with Otila Kluver at [facility].    If any questions, please contact me at [phone].  Thank you ,  Shaune Pollack [facility] AWV TEAM Direct Dial [phone]

## 2023-01-31 ENCOUNTER — Ambulatory Visit (INDEPENDENT_AMBULATORY_CARE_PROVIDER_SITE_OTHER): Payer: PPO

## 2023-01-31 VITALS — Wt 190.0 lb

## 2023-01-31 DIAGNOSIS — H353211 Exudative age-related macular degeneration, right eye, with active choroidal neovascularization: Secondary | ICD-10-CM | POA: Diagnosis not present

## 2023-01-31 DIAGNOSIS — Z0000 Encounter for general adult medical examination without abnormal findings: Secondary | ICD-10-CM | POA: Diagnosis not present

## 2023-01-31 NOTE — Patient Instructions (Signed)
Edward Goodwin , Thank you for taking time to come for your Medicare Wellness Visit. I appreciate your ongoing commitment to your health goals. Please review the following plan we discussed and let me know if I can assist you in the future.   These are the goals we discussed:  Goals      Patient Stated     None at this time      Patient Stated     None at this time      [place] (see longitudinal plan of care for additional care plan information)  Current Barriers:  Chronic Disease Management support, education, and care coordination needs related to Hypertension, Hyperlipidemia, and Gout   Hypertension BP Readings from Last 3 Encounters:  05/29/20 127/80  11/25/19 136/68  01/31/19 122/68  Pharmacist Clinical Goal(s): Over the next 180 days, patient will work with PharmD and providers to maintain BP goal <130/80 Current regimen:  Lisinopril 20 mg once daily Interventions: Diet/exercise recommendations Patient self care activities - Over the next 180 days, patient will: Check BP at elast once every 1-2 weeks, document, and provide at future appointments Ensure daily salt intake < 1800 mg/day  Hyperlipidemia Lab Results  Component Value Date/Time   LDLCALC 54 11/20/2019 08:14 AM  Pharmacist Clinical Goal(s): Over the next 180 days, patient will work with PharmD and providers to maintain LDL goal < 70 Current regimen:  Atorvastatin 20 mg once daily Interventions: Diet / exercise recommendations Patient self care activities - Over the next 180 days, patient will: Continue current management Gout Pharmacist Clinical Goal(s) Over the next 180 days, patient will work with PharmD and providers to minimize symptoms of gout Current regimen:  N/a Interventions: Discussed low purine diet Patient self care activities - Over the next 180 days, patient will: Continue current management  Medication management Pharmacist Clinical Goal(s): Over the next  180 days, patient will work with PharmD and providers to maintain optimal medication adherence Current pharmacy: [facility] Interventions Comprehensive medication review performed. Continue current medication management strategy Patient self care activities - Over the next 180 days, patient will: Take medications as prescribed Report any questions or concerns to PharmD and/or provider(s) Initial goal documentation.     Weight (lb) < 200 lb (90.7 kg)     Patient has a goal of getting to 200lb for his physical this year        This is a list of the screening recommended for you and due dates:  Health Maintenance  Topic Date Due   COVID-19 Vaccine (7 - 2023-24 season) 11/26/2022   Medicare Annual Wellness Visit  01/31/2024   Colon Cancer Screening  05/21/2025   DTaP/Tdap/Td vaccine (3 - Td or Tdap) 11/25/2029   Pneumonia Vaccine  Completed   Flu Shot  Completed   Hepatitis C Screening: USPSTF Recommendation to screen - Ages 18-79 yo.  Completed   Zoster (Shingles) Vaccine  Completed   HPV Vaccine  Aged Out    Advanced directives: copies in chart   Conditions/risks identified: stay active and healthy   Next appointment: Follow up in one year for your annual wellness visit.   Preventive Care 7 Years and Older, Male  Preventive care refers to lifestyle choices and visits with your health care provider that can promote health and wellness. What does preventive care include? A yearly physical exam. This is also called an annual well check. Dental exams once or twice a year. Routine eye  exams. Ask your health care provider how often you should have your eyes checked. Personal lifestyle choices, including: Daily care of your teeth and gums. Regular physical activity. Eating a healthy diet. Avoiding tobacco and drug use. Limiting alcohol use. Practicing safe sex. Taking low doses of aspirin every day. Taking vitamin and mineral supplements as recommended by your health care  provider. What happens during an annual well check? The services and screenings done by your health care provider during your annual well check will depend on your age, overall health, lifestyle risk factors, and family history of disease. Counseling  Your health care provider may ask you questions about your: Alcohol use. Tobacco use. Drug use. Emotional well-being. Home and relationship well-being. Sexual activity. Eating habits. History of falls. Memory and ability to understand (cognition). Work and work Statistician. Screening  You may have the following tests or measurements: Height, weight, and BMI. Blood pressure. Lipid and cholesterol levels. These may be checked every 5 years, or more frequently if you are over 75 years old. Skin check. Lung cancer screening. You may have this screening every year starting at age 61 if you have a 30-pack-year history of smoking and currently smoke or have quit within the past 15 years. Fecal occult blood test (FOBT) of the stool. You may have this test every year starting at age 86. Flexible sigmoidoscopy or colonoscopy. You may have a sigmoidoscopy every 5 years or a colonoscopy every 10 years starting at age 94. Prostate cancer screening. Recommendations will vary depending on your family history and other risks. Hepatitis C blood test. Hepatitis B blood test. Sexually transmitted disease (STD) testing. Diabetes screening. This is done by checking your blood sugar (glucose) after you have not eaten for a while (fasting). You may have this done every 1-3 years. Abdominal aortic aneurysm (AAA) screening. You may need this if you are a current or former smoker. Osteoporosis. You may be screened starting at age 45 if you are at high risk. Talk with your health care provider about your test results, treatment options, and if necessary, the need for more tests. Vaccines  Your health care provider may recommend certain vaccines, such  as: Influenza vaccine. This is recommended every year. Tetanus, diphtheria, and acellular pertussis (Tdap, Td) vaccine. You may need a Td booster every 10 years. Zoster vaccine. You may need this after age 5. Pneumococcal 13-valent conjugate (PCV13) vaccine. One dose is recommended after age 85. Pneumococcal polysaccharide (PPSV23) vaccine. One dose is recommended after age 59. Talk to your health care provider about which screenings and vaccines you need and how often you need them. This information is not intended to replace advice given to you by your health care provider. Make sure you discuss any questions you have with your health care provider. Document Released: 11/27/2015 Document Revised: 07/20/2016 Document Reviewed: 09/01/2015 Elsevier Interactive Patient Education  2017 [place] Prevention in the Home Falls can cause injuries. They can happen to people of all ages. There are many things you can do to make your home safe and to help prevent falls. What can I do on the outside of my home? Regularly fix the edges of walkways and driveways and fix any cracks. Remove anything that might make you trip as you walk through a door, such as a raised step or threshold. Trim any bushes or trees on the path to your home. Use bright outdoor lighting. Clear any walking paths of anything that might make someone trip, such as  rocks or tools. Regularly check to see if handrails are loose or broken. Make sure that both sides of any steps have handrails. Any raised decks and porches should have guardrails on the edges. Have any leaves, snow, or ice cleared regularly. Use sand or salt on walking paths during winter. Clean up any spills in your garage right away. This includes oil or grease spills. What can I do in the bathroom? Use night lights. Install grab bars by the toilet and in the tub and shower. Do not use towel bars as grab bars. Use non-skid mats or decals in the tub or  shower. If you need to sit down in the shower, use a plastic, non-slip stool. Keep the floor dry. Clean up any water that spills on the floor as soon as it happens. Remove soap buildup in the tub or shower regularly. Attach bath mats securely with double-sided non-slip rug tape. Do not have throw rugs and other things on the floor that can make you trip. What can I do in the bedroom? Use night lights. Make sure that you have a light by your bed that is easy to reach. Do not use any sheets or blankets that are too big for your bed. They should not hang down onto the floor. Have a firm chair that has side arms. You can use this for support while you get dressed. Do not have throw rugs and other things on the floor that can make you trip. What can I do in the kitchen? Clean up any spills right away. Avoid walking on wet floors. Keep items that you use a lot in easy-to-reach places. If you need to reach something above you, use a strong step stool that has a grab bar. Keep electrical cords out of the way. Do not use floor polish or wax that makes floors slippery. If you must use wax, use non-skid floor wax. Do not have throw rugs and other things on the floor that can make you trip. What can I do with my stairs? Do not leave any items on the stairs. Make sure that there are handrails on both sides of the stairs and use them. Fix handrails that are broken or loose. Make sure that handrails are as long as the stairways. Check any carpeting to make sure that it is firmly attached to the stairs. Fix any carpet that is loose or worn. Avoid having throw rugs at the top or bottom of the stairs. If you do have throw rugs, attach them to the floor with carpet tape. Make sure that you have a light switch at the top of the stairs and the bottom of the stairs. If you do not have them, ask someone to add them for you. What else can I do to help prevent falls? Wear shoes that: Do not have high heels. Have  rubber bottoms. Are comfortable and fit you well. Are closed at the toe. Do not wear sandals. If you use a stepladder: Make sure that it is fully opened. Do not climb a closed stepladder. Make sure that both sides of the stepladder are locked into place. Ask someone to hold it for you, if possible. Clearly mark and make sure that you can see: Any grab bars or handrails. First and last steps. Where the edge of each step is. Use tools that help you move around (mobility aids) if they are needed. These include: Canes. Walkers. Scooters. Crutches. Turn on the lights when you go into a dark  area. Replace any light bulbs as soon as they burn out. Set up your furniture so you have a clear path. Avoid moving your furniture around. If any of your floors are uneven, fix them. If there are any pets around you, be aware of where they are. Review your medicines with your doctor. Some medicines can make you feel dizzy. This can increase your chance of falling. Ask your doctor what other things that you can do to help prevent falls. This information is not intended to replace advice given to you by your health care provider. Make sure you discuss any questions you have with your health care provider. Document Released: 08/27/2009 Document Revised: 04/07/2016 Document Reviewed: 12/05/2014 Elsevier Interactive Patient Education  2017 [employer].

## 2023-01-31 NOTE — Progress Notes (Signed)
I connected with  Edward Goodwin on 01/31/23 by a audio enabled telemedicine application and verified that I am speaking with the correct person using two identifiers.  Patient Location: Home  Provider Location: Office/Clinic  I discussed the limitations of evaluation and management by telemedicine. The patient expressed understanding and agreed to proceed.   Subjective:   Edward Goodwin is a 72 y.o. male who presents for Medicare Annual/Subsequent preventive examination.  Review of Systems     Cardiac Risk Factors include: advanced age (>62men, >57 women);dyslipidemia;hypertension;male gender     Objective:    Today's Vitals   01/31/23 1430  Weight: 190 lb (86.2 kg)   Body mass index is 28.06 kg/m.     01/31/2023    2:34 PM 10/11/2022    8:18 PM 07/22/2022    7:47 AM 04/16/2022    8:15 AM 02/16/2022    8:55 AM 01/27/2022    2:28 PM 01/11/2021    9:44 AM  Advanced Directives  Does Patient Have a Medical Advance Directive? Yes Yes Yes Yes Yes Yes Yes  Type of Paramedic of [place];Living will Living will;Healthcare Power of [place];Living will  [place];Living will Healthcare Power of [place]  Does patient want to make changes to medical advance directive? No - Patient declined No - Patient declined No - Patient declined No - Patient declined No - Patient declined    Copy of [place] in Chart? Yes - validated most recent copy scanned in chart (See row information)  No - copy requested  Yes - validated most recent copy scanned in chart (See row information) Yes - validated most recent copy scanned in chart (See row information) Yes - validated most recent copy scanned in chart (See row information)    Current Medications (verified) Outpatient Encounter Medications as of 01/31/2023  Medication Sig   acetaminophen (TYLENOL) 650 MG CR tablet Take 650 mg by mouth  every 8 (eight) hours as needed for pain.   aflibercept (EYLEA HD) 8 MG/0.07ML SOLN by Intravitreal route.   apixaban (ELIQUIS) 5 MG TABS tablet Take 1 tablet (5 mg total) by mouth 2 (two) times daily.   Ascorbic Acid (VITAMIN C PO) Take 1 tablet by mouth daily. 1000 mg   atorvastatin (LIPITOR) 20 MG tablet TAKE 1 TABLET BY MOUTH EVERY DAY   clobetasol ointment (TEMOVATE) [id number] % Apply 1 Application topically 2 (two) times daily as needed (irritation).   diltiazem (CARDIZEM) 30 MG tablet Take one tablet by mouth every 4 hrs as needed, Heart rate needs to be above 100, top number blood pressure above 100   diphenhydramine-acetaminophen (TYLENOL PM) 25-500 MG TABS tablet Take 1 tablet by mouth at bedtime as needed (pain/sleep).   isosorbide mononitrate (IMDUR) 30 MG 24 hr tablet Take 0.5 tablets (15 mg total) by mouth daily.   L-Lysine 1000 MG TABS Take 1,000 mg by mouth daily.   lisinopril (ZESTRIL) 20 MG tablet Take 1 tablet (20 mg total) by mouth daily.   Multiple Vitamins-Minerals (PRESERVISION AREDS 2) CAPS Take 1 capsule by mouth 2 (two) times daily before lunch and supper.   nitroGLYCERIN (NITROSTAT) 0.4 MG SL tablet Place 1 tablet (0.4 mg total) under the tongue every 5 (five) minutes as needed.   Propylene Glycol (SYSTANE COMPLETE OP) Place 1 drop into both eyes 2 (two) times daily.   No facility-administered encounter medications on file as of 01/31/2023.  Allergies (verified) Sulfonamide derivatives   History: Past Medical History:  Diagnosis Date   Chicken pox    Colitis, ulcerative ([place])    Hypertension    Macular degeneration, bilateral 07/2012   areds 2    Psoriasis    Past Surgical History:  Procedure Laterality Date   ACNE CYST REMOVAL     CATARACT EXTRACTION W/ INTRAOCULAR LENS IMPLANT  10/2014   bilat   COLONOSCOPY     FOOT SURGERY  1988   LEFT HEART CATH AND CORONARY ANGIOGRAPHY N/A 07/22/2022   Procedure: LEFT HEART CATH AND CORONARY ANGIOGRAPHY;  Surgeon:  Belva Crome, MD;  Location: [facility];  Service: Cardiovascular;  Laterality: N/A;   SHOULDER ARTHROSCOPY  2007   Right   TONSILLECTOMY     Family History  Problem Relation Age of Onset   Heart disease Mother    Breast cancer Mother    Hypertension Mother    Stroke Mother    Hypothyroidism Mother    Dementia Mother    Heart disease Father        CAD/MI 19s or 2s smoker. led to death age 67.    Hypertension Father    Diabetes Daughter    Diabetes Maternal Uncle    Hepatitis C Brother    Stroke Maternal Grandmother    Emphysema Maternal Grandfather    Arthritis Paternal Grandmother    Colon cancer Neg Hx    Esophageal cancer Neg Hx    Stomach cancer Neg Hx    Rectal cancer Neg Hx    Social History   Socioeconomic History   Marital status: Married    Spouse name: Not on file   Number of children: 1   Years of education: 12   Highest education level: Not on file  Occupational History   Occupation: Herbalist    Comment: retired   Tobacco Use   Smoking status: Former    Types: Cigarettes    Quit date: 10/23/1989    Years since quitting: 33.2   Smokeless tobacco: Never  Vaping Use   Vaping Use: Never used  Substance and Sexual Activity   Alcohol use: Not Currently    Comment: rare glass of wine - less than once a year   Drug use: No   Sexual activity: Not Currently  Other Topics Concern   Not on file  Social History Narrative   Married 1971. 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 [employer] 6 years.       ACP: HCPOA - wife. Yes for acute CPR, no for prolonged mechanical ventilatory support, no for prolonged artificial nutrition or other heroic measures leaving him in an incapacitated state.    Social Determinants of Health   Financial Resource Strain: Low Risk  (01/31/2023)   Overall Financial Resource Strain (CARDIA)     Difficulty of Paying Living Expenses: Not hard at all  Food Insecurity: No Food Insecurity (01/31/2023)   Hunger Vital Sign    Worried About Running Out of Food in the Last Year: Never true    Ran Out of Food in the Last Year: Never true  Transportation Needs: No Transportation Needs (01/31/2023)   PRAPARE - Hydrologist (Medical): No    Lack of Transportation (Non-Medical): No  Physical Activity: Sufficiently Active (01/31/2023)   Exercise Vital  Sign    Days of Exercise per Week: 2 days    Minutes of Exercise per Session: 150+ min  Stress: No Stress Concern Present (01/31/2023)   [place]    Feeling of Stress : Not at all  Social Connections: Moderately Integrated (01/31/2023)   Social Connection and Isolation Panel [NHANES]    Frequency of Communication with Friends and Family: More than three times a week    Frequency of Social Gatherings with Friends and Family: More than three times a week    Attends Religious Services: More than 4 times per year    Active Member of [employer] or Organizations: No    Attends Music therapist: Never    Marital Status: Married    Tobacco Counseling Counseling given: Not Answered   Clinical Intake:  Pre-visit preparation completed: Yes  Pain : No/denies pain     BMI - recorded: 28.06 Nutritional Status: BMI 25 -29 Overweight Nutritional Risks: None Diabetes: No  How often do you need to have someone help you when you read instructions, pamphlets, or other written materials from your doctor or pharmacy?: 1 - Never  Diabetic?no  Interpreter Needed?: No  Information entered by :: Charlott Rakes, LPN   Activities of Daily Living    01/31/2023    2:37 PM  In your present state of health, do you have any difficulty performing the following activities:  Hearing? 0  Vision? 0  Difficulty concentrating or making decisions? 0  Walking  or climbing stairs? 0  Dressing or bathing? 0  Doing errands, shopping? 0  Preparing Food and eating ? N  Using the Toilet? N  In the past six months, have you accidently leaked urine? N  Do you have problems with loss of bowel control? N  Managing your Medications? N  Managing your Finances? N  Housekeeping or managing your Housekeeping? N    Patient Care Team: [name], MD as PCP - General (Family Medicine) Troy Sine, MD as PCP - Sleep Medicine (Cardiology) Donato Heinz, MD as PCP - Cardiology (Cardiology) [place], Heather, [place] as Consulting Physician (Optometry) Harriett Sine, MD as Consulting Physician (Dermatology) Princess Bruins, MD as Consulting Physician (Ophthalmology) Madelin Rear, [facility] (Inactive) as Pharmacist (Pharmacist)  Indicate any recent Medical Services you may have received from other than [facility] providers in the past year (date may be approximate).     Assessment:   This is a routine wellness examination for Lola.  Hearing/Vision screen Hearing Screening - Comments:: Pt wears hearing aids  Vision Screening - Comments:: Pt follows upwith Dr Heather [place] for annual eye exam   Dietary issues and exercise activities discussed: Current Exercise Habits: Home exercise routine, Type of exercise: walking;Other - see comments (golf), Time (Minutes): > 60, Frequency (Times/Week): 2, Weekly Exercise (Minutes/Week): 0   Goals Addressed             This Visit's Progress    Patient Stated       Stay active and healthy       Depression Screen    01/31/2023    2:35 PM 01/05/2023    7:57 AM 06/15/2022    8:57 AM 01/27/2022    2:27 PM 06/10/2021    8:43 AM 01/11/2021    9:43 AM 12/03/2020    8:17 AM  PHQ 2/9 Scores  PHQ - 2 Score 0 0 0 0 0 0 0  PHQ- 9 Score 0 1 0  0    Fall Risk    01/31/2023    2:35 PM 01/05/2023    7:56 AM 06/15/2022    8:57 AM 04/29/2022    9:51 AM 01/27/2022    2:29 PM  Fall Risk   Falls in the past year? 1 1 0 0 0   Number falls in past yr: 1 0 0 0 0  Injury with Fall? 1 1 0 0 0  Risk for fall due to : History of fall(s);Impaired vision History of fall(s) No Fall Risks No Fall Risks Impaired vision  Follow up  Falls evaluation completed Falls evaluation completed Falls evaluation completed Falls prevention discussed    FALL RISK PREVENTION PERTAINING TO THE HOME:  Any stairs in or around the home? Yes  If so, are there any without handrails? No  Home free of loose throw rugs in walkways, pet beds, electrical cords, etc? Yes  Adequate lighting in your home to reduce risk of falls? Yes   ASSISTIVE DEVICES UTILIZED TO PREVENT FALLS:  Life alert? No  Use of a cane, walker or w/c? No  Grab bars in the bathroom? No  Shower chair or bench in shower? No  Elevated toilet seat or a handicapped toilet? No   TIMED UP AND GO:  Was the test performed? No .   Cognitive Function:    12/05/2018    1:48 PM  MMSE - Mini Mental State Exam  Orientation to time 4  Orientation to Place 5  Registration 3  Attention/ Calculation 5  Recall 3  Language- name 2 objects 2  Language- repeat 1  Language- follow 3 step command 3  Language- read & follow direction 1  Write a sentence 1  Copy design 1  Total score 29        01/31/2023    2:37 PM 01/27/2022    2:30 PM 01/11/2021    9:48 AM 12/09/2019    8:58 AM  6CIT Screen  What Year? 0 points 0 points 0 points 0 points  What month? 0 points 0 points 0 points 0 points  What time? 0 points 0 points  0 points  Count back from 20 0 points 0 points 0 points 0 points  Months in reverse 0 points 0 points 0 points 0 points  Repeat phrase 0 points 0 points 0 points 0 points  Total Score 0 points 0 points  0 points    Immunizations Immunization History  Administered Date(s) Administered   COVID-19, mRNA, vaccine(Comirnaty)12 years and older 10/01/2022   Fluad Quad(high Dose 65+) 08/27/2019, 09/19/2020, 08/17/2022   Influenza Split 10/03/2011   Influenza  Whole 08/29/2012   Influenza, High Dose Seasonal PF 08/17/2015, 08/17/2015, 09/01/2016, 08/22/2018, 08/27/2021   Influenza,inj,Quad PF,6+ Mos 09/05/2013, 09/08/2014, 09/19/2020   Influenza-Unspecified 08/17/2017, 08/28/2018   PFIZER Comirnaty(Gray Top)Covid-19 Tri-Sucrose Vaccine 05/28/2021, 10/01/2022   PFIZER(Purple Top)SARS-COV-2 Vaccination 01/07/2020, 01/28/2020, 08/25/2020   PNEUMOCOCCAL CONJUGATE-20 04/28/2021   Pfizer Covid-19 Vaccine Bivalent Booster 67yrs & up 10/16/2021   Pneumococcal Conjugate-13 10/08/2013   Pneumococcal Polysaccharide-23 10/02/2009, 02/04/2015   Td 10/02/2009   Tdap 11/26/2019   Zoster Recombinat (Shingrix) 12/28/2018, 10/05/2019   Zoster, Live 12/04/2009    TDAP status: Up to date  Flu Vaccine status: Up to date  Pneumococcal vaccine status: Up to date  Covid-19 vaccine status: Completed vaccines  Qualifies for Shingles Vaccine? Yes   Zostavax completed Yes   Shingrix Completed?: Yes  Screening Tests Health Maintenance  Topic Date Due   COVID-19 Vaccine (  7 - 2023-24 season) 11/26/2022   Medicare Annual Wellness (AWV)  01/31/2024   COLONOSCOPY (Pts 45-26yrs Insurance coverage will need to be confirmed)  05/21/2025   DTaP/Tdap/Td (3 - Td or Tdap) 11/25/2029   Pneumonia Vaccine 82+ Years old  Completed   INFLUENZA VACCINE  Completed   Hepatitis C Screening  Completed   Zoster Vaccines- Shingrix  Completed   HPV VACCINES  Aged Out    Health Maintenance  Health Maintenance Due  Topic Date Due   COVID-19 Vaccine (7 - 2023-24 season) 11/26/2022    Colorectal cancer screening: Type of screening: Colonoscopy. Completed 05/22/15. Repeat every 10 years  Additional Screening:  Hepatitis C Screening:  Completed 11/15/16  Vision Screening: Recommended annual ophthalmology exams for early detection of glaucoma and other disorders of the eye. Is the patient up to date with their annual eye exam?  Yes  Who is the provider or what is the name of the  office in which the patient attends annual eye exams? Dr heather [place] If pt is not established with a provider, would they like to be referred to a provider to establish care? No .   Dental Screening: Recommended annual dental exams for proper oral hygiene  Community Resource Referral / Chronic Care Management: CRR required this visit?  No   CCM required this visit?  No      Plan:     I have personally reviewed and noted the following in the patient's chart:   Medical and social history Use of alcohol, tobacco or illicit drugs  Current medications and supplements including opioid prescriptions. Patient is not currently taking opioid prescriptions. Functional ability and status Nutritional status Physical activity Advanced directives List of other physicians Hospitalizations, surgeries, and ER visits in previous 12 months Vitals Screenings to include cognitive, depression, and falls Referrals and appointments  In addition, I have reviewed and discussed with patient certain preventive protocols, quality metrics, and best practice recommendations. A written personalized care plan for preventive services as well as general preventive health recommendations were provided to patient.     Willette Brace, LPN   [id number]   Nurse Notes: none

## 2023-02-03 ENCOUNTER — Ambulatory Visit: Payer: PPO

## 2023-02-27 ENCOUNTER — Other Ambulatory Visit: Payer: Self-pay

## 2023-02-27 ENCOUNTER — Emergency Department (HOSPITAL_BASED_OUTPATIENT_CLINIC_OR_DEPARTMENT_OTHER)
Admission: EM | Admit: 2023-02-27 | Discharge: 2023-02-27 | Disposition: A | Discharge status: Home / Self Care | Payer: PPO | Attending: Emergency Medicine | Admitting: Emergency Medicine

## 2023-02-27 ENCOUNTER — Other Ambulatory Visit: Payer: Self-pay | Admitting: Family Medicine

## 2023-02-27 ENCOUNTER — Emergency Department (HOSPITAL_BASED_OUTPATIENT_CLINIC_OR_DEPARTMENT_OTHER): Payer: PPO | Admitting: Radiology

## 2023-02-27 DIAGNOSIS — Z79899 Other long term (current) drug therapy: Secondary | ICD-10-CM | POA: Insufficient documentation

## 2023-02-27 DIAGNOSIS — Z7901 Long term (current) use of anticoagulants: Secondary | ICD-10-CM | POA: Insufficient documentation

## 2023-02-27 DIAGNOSIS — Z87891 Personal history of nicotine dependence: Secondary | ICD-10-CM | POA: Insufficient documentation

## 2023-02-27 DIAGNOSIS — R079 Chest pain, unspecified: Secondary | ICD-10-CM | POA: Diagnosis not present

## 2023-02-27 DIAGNOSIS — I2510 Atherosclerotic heart disease of native coronary artery without angina pectoris: Secondary | ICD-10-CM | POA: Diagnosis not present

## 2023-02-27 DIAGNOSIS — I10 Essential (primary) hypertension: Secondary | ICD-10-CM | POA: Diagnosis not present

## 2023-02-27 DIAGNOSIS — R0789 Other chest pain: Secondary | ICD-10-CM | POA: Diagnosis not present

## 2023-02-27 LAB — BASIC METABOLIC PANEL
Anion gap: 11 (ref 5–15)
BUN: 26 mg/dL — ABNORMAL HIGH (ref 8–23)
CO2: 24 mmol/L (ref 22–32)
Calcium: 9.3 mg/dL (ref 8.9–10.3)
Chloride: 106 mmol/L (ref 98–111)
Creatinine, Ser: 1.15 mg/dL (ref 0.61–1.24)
GFR, Estimated: >60 mL/min (ref >60)
Glucose, Bld: 93 mg/dL (ref 70–99)
Potassium: 4.4 mmol/L (ref 3.5–5.1)
Sodium: 141 mmol/L (ref 135–145)

## 2023-02-27 LAB — CBC
HCT: 40.7 % (ref 39.0–52.0)
Hemoglobin: 14 g/dL (ref 13.0–17.0)
MCH: 30.5 pg (ref 26.0–34.0)
MCHC: 34.4 g/dL (ref 30.0–36.0)
MCV: 88.7 fL (ref 80.0–100.0)
Platelets: 142 10*3/uL — ABNORMAL LOW (ref 150–400)
RBC: 4.59 MIL/uL (ref 4.22–5.81)
RDW: 13.3 % (ref 11.5–15.5)
WBC: 6.3 10*3/uL (ref 4.0–10.5)
nRBC: 0 % (ref 0.0–0.2)

## 2023-02-27 LAB — TROPONIN I (HIGH SENSITIVITY)
Troponin I (High Sensitivity): 9 ng/L (ref <18)
Troponin I (High Sensitivity): 8 ng/L (ref <18)

## 2023-02-27 LAB — CBG MONITORING, ED: Glucose-Capillary: 98 mg/dL (ref 70–99)

## 2023-02-27 MED ORDER — ASPIRIN 81 MG PO CHEW
324.0000 mg | CHEWABLE_TABLET | Freq: Once | ORAL | Status: AC
  Administered 2023-02-27: 324 mg via ORAL
  Filled 2023-02-27: qty 4

## 2023-02-27 MED ORDER — ACETAMINOPHEN 325 MG PO TABS
650.0000 mg | ORAL_TABLET | Freq: Once | ORAL | Status: AC
  Administered 2023-02-27: 650 mg via ORAL
  Filled 2023-02-27: qty 2

## 2023-02-27 NOTE — ED Notes (Signed)
Pt verbalized understanding of d/c instructions, meds, and followup care. Denies questions. VSS, no distress noted. Steady gait to exit with all belongings.  ?

## 2023-02-27 NOTE — Discharge Instructions (Addendum)
It was a pleasure caring for you today in the emergency department.  Please follow-up with your cardiologist, a referral was placed  If you have any worsening symptoms or experiencing been such as crushing chest pain, difficulty breathing, feeling hot and sweaty, lightheaded, nauseated in conjunction with chest pain please return to the ER for evaluation.  Please return to the emergency department for any worsening or worrisome symptoms.

## 2023-02-27 NOTE — ED Triage Notes (Signed)
Patient arrives ambulatory to triage with complaints of left-side intermittent chest discomfort x2 days. Patient states that he is now having numbness into the left arm as well.

## 2023-02-27 NOTE — ED Provider Notes (Signed)
EMERGENCY DEPARTMENT AT [facility] Provider Note  CSN: [id number] Arrival date & time: 02/27/23 1449  Chief Complaint(s) Numbness and Chest Pain  HPI Edward Goodwin is a 72 y.o. male with past medical history as below, significant for HTN, A-fib on Eliquis, colitis, macular degeneration, psoriasis who presents to the ED with complaint of cp/shoulder pain.  Patient reports intermittent chest wall, left shoulder pain over the past few weeks, feels the frequency of the pain episodes has increased over the past few days.  Pain primarily to left shoulder blade scribed as sharp and stabbing.  Some intermittent pain to his anterior shoulder and upper chest wall described as sharp intermittently.  Had an achy sensation to his left elbow few hours prior to arrival which is since subsided.  Pain occurs at rest and with exertion, no associated dyspnea, diaphoresis, nausea, vomiting, lightheadedness notes or syncope.  He is compliant with his Eliquis.  Spouse reports he exerted himself more heavily than normal 2 days ago with yard work  Past Medical History Past Medical History:  Diagnosis Date   Chicken pox    Colitis, ulcerative (HCC)    Hypertension    Macular degeneration, bilateral 07/2012   areds 2    Psoriasis    Patient Active Problem List   Diagnosis Date Noted   Paroxysmal atrial fibrillation 04/21/2022   Secondary hypercoagulable state 04/21/2022   nonobstructive CAD (coronary artery disease)- CAC 247 on 05/03/2018  12/16/2021   Abdominal aortic aneurysm (AAA) without rupture, unspecified part    Former smoker 04/19/2018   Hyperlipidemia 04/19/2018   Vertigo 12/19/2017   Onychomycosis 12/19/2017   Trochanteric bursitis of left hip 03/31/2015   Gout 03/24/2014   Elevated PSA, less than 10 ng/ml 09/09/2013   Macular degeneration, bilateral 07/15/2012   Obesity (BMI 30.0-34.9) 12/02/2008   Essential hypertension 08/20/2007   Ulcerative colitis 08/20/2007    PSORIASIS 08/20/2007   Home Medication(s) Prior to Admission medications   Medication Sig Start Date End Date Taking? Authorizing Provider  acetaminophen (TYLENOL) 650 MG CR tablet Take 650 mg by mouth every 8 (eight) hours as needed for pain.    [provider]  aflibercept (EYLEA HD) 8 MG/0.07ML SOLN by Intravitreal route.    [provider]  apixaban (ELIQUIS) 5 MG TABS tablet Take 1 tablet (5 mg total) by mouth 2 (two) times daily. 09/19/22   Little Ishikawa, MD  Ascorbic Acid (VITAMIN C PO) Take 1 tablet by mouth daily. 1000 mg    [provider]  atorvastatin (LIPITOR) 20 MG tablet TAKE 1 TABLET BY MOUTH EVERY DAY 02/27/23   Shelva Majestic, MD  clobetasol ointment (TEMOVATE) 0.05 % Apply 1 Application topically 2 (two) times daily as needed (irritation). 04/08/20   [provider]  diltiazem (CARDIZEM) 30 MG tablet Take one tablet by mouth every 4 hrs as needed, Heart rate needs to be above 100, top number blood pressure above 100 05/23/22   Fenton, Clint R, PA  diphenhydramine-acetaminophen (TYLENOL PM) 25-500 MG TABS tablet Take 1 tablet by mouth at bedtime as needed (pain/sleep).    [provider]  isosorbide mononitrate (IMDUR) 30 MG 24 hr tablet Take 0.5 tablets (15 mg total) by mouth daily. 08/16/22 08/11/23  Little Ishikawa, MD  L-Lysine 1000 MG TABS Take 1,000 mg by mouth daily.    [provider]  lisinopril (ZESTRIL) 20 MG tablet Take 1 tablet (20 mg total) by mouth daily. 01/18/23   Epifanio Lesches  L, MD  Multiple Vitamins-Minerals (PRESERVISION AREDS 2) CAPS Take 1 capsule by mouth 2 (two) times daily before lunch and supper. 07/16/12   [provider]  nitroGLYCERIN (NITROSTAT) 0.4 MG SL tablet Place 1 tablet (0.4 mg total) under the tongue every 5 (five) minutes as needed. 12/20/22 03/20/23  Little Ishikawa, MD  Propylene Glycol (SYSTANE COMPLETE OP) Place 1 drop into both eyes 2 (two) times  daily.    [provider]                                                                                                                                    Past Surgical History Past Surgical History:  Procedure Laterality Date   ACNE CYST REMOVAL     CATARACT EXTRACTION W/ INTRAOCULAR LENS IMPLANT  10/2014   bilat   COLONOSCOPY     FOOT SURGERY  1988   LEFT HEART CATH AND CORONARY ANGIOGRAPHY N/A 07/22/2022   Procedure: LEFT HEART CATH AND CORONARY ANGIOGRAPHY;  Surgeon: Lyn Records, MD;  Location: [facility];  Service: Cardiovascular;  Laterality: N/A;   SHOULDER ARTHROSCOPY  2007   Right   TONSILLECTOMY     Family History Family History  Problem Relation Age of Onset   Heart disease Mother    Breast cancer Mother    Hypertension Mother    Stroke Mother    Hypothyroidism Mother    Dementia Mother    Heart disease Father        CAD/MI 30s or 86s smoker. led to death age 40.    Hypertension Father    Diabetes Daughter    Diabetes Maternal Uncle    Hepatitis C Brother    Stroke Maternal Grandmother    Emphysema Maternal Grandfather    Arthritis Paternal Grandmother    Colon cancer Neg Hx    Esophageal cancer Neg Hx    Stomach cancer Neg Hx    Rectal cancer Neg Hx     Social History Social History   Tobacco Use   Smoking status: Former    Types: Cigarettes    Quit date: 10/23/1989    Years since quitting: 33.3   Smokeless tobacco: Never  Vaping Use   Vaping Use: Never used  Substance Use Topics   Alcohol use: Not Currently    Comment: rare glass of wine - less than once a year   Drug use: No   Allergies Sulfonamide derivatives  Review of Systems Review of Systems  Constitutional:  Negative for chills and fever.  HENT:  Negative for facial swelling and trouble swallowing.   Eyes:  Negative for photophobia and visual disturbance.  Respiratory:  Negative for cough and shortness of breath.   Cardiovascular:  Positive for chest pain.  Negative for palpitations.  Gastrointestinal:  Negative for abdominal pain, nausea and vomiting.  Endocrine: Negative for polydipsia and polyuria.  Genitourinary:  Negative for difficulty  urinating and hematuria.  Musculoskeletal:  Positive for arthralgias. Negative for gait problem and joint swelling.  Skin:  Negative for pallor and rash.  Neurological:  Negative for syncope and headaches.  Psychiatric/Behavioral:  Negative for agitation and confusion.     Physical Exam Vital Signs  I have reviewed the triage vital signs BP (!) 141/72   Pulse (!) 59   Temp 98.6 F (37 C) (Oral)   Resp 14   Ht 5\' 9"  (1.753 m)   Wt 86.2 kg   SpO2 98%   BMI 28.06 kg/m  Physical Exam Vitals and nursing note reviewed.  Constitutional:      General: He is not in acute distress.    Appearance: Normal appearance. He is well-developed.  HENT:     Head: Normocephalic and atraumatic.     Right Ear: External ear normal.     Left Ear: External ear normal.     Mouth/Throat:     Mouth: Mucous membranes are moist.  Eyes:     General: No scleral icterus. Cardiovascular:     Rate and Rhythm: Regular rhythm. Bradycardia present.     Pulses: Normal pulses.     Heart sounds: Normal heart sounds.  Pulmonary:     Effort: Pulmonary effort is normal. No respiratory distress.     Breath sounds: Normal breath sounds.  Abdominal:     General: Abdomen is flat.     Palpations: Abdomen is soft.     Tenderness: There is no abdominal tenderness. There is no guarding.  Musculoskeletal:     Right lower leg: No edema.     Left lower leg: No edema.  Skin:    General: Skin is warm and dry.     Capillary Refill: Capillary refill takes less than 2 seconds.  Neurological:     Mental Status: He is alert and oriented to person, place, and time.  Psychiatric:        Mood and Affect: Mood normal.        Behavior: Behavior normal.     ED Results and Treatments Labs (all labs ordered are listed, but only abnormal  results are displayed) Labs Reviewed  BASIC METABOLIC PANEL - Abnormal; Notable for the following components:      Result Value   BUN 26 (*)    All other components within normal limits  CBC - Abnormal; Notable for the following components:   Platelets 142 (*)    All other components within normal limits  CBG MONITORING, ED  TROPONIN I (HIGH SENSITIVITY)  TROPONIN I (HIGH SENSITIVITY)                                                                                                                          Radiology DG Chest 2 View  Result Date: 02/27/2023 CLINICAL DATA:  Chest pain EXAM: CHEST - 2 VIEW COMPARISON:  CXR 04/17/27 FINDINGS: No pleural effusion. No pneumothorax. Normal cardiac and mediastinal contours. Unchanged appearance of the  right hemidiaphragm. No focal airspace opacity. No radiographically apparent displaced rib fractures. Visualized upper abdomen is unremarkable. IMPRESSION: No focal airspace opacity. Electronically Signed   By: Lorenza Cambridge M.D.   On: 02/27/2023 15:48    Pertinent labs & imaging results that were available during my care of the patient were reviewed by me and considered in my medical decision making (see MDM for details).  Medications Ordered in ED Medications  acetaminophen (TYLENOL) tablet 650 mg (650 mg Oral Given 02/27/23 1631)  aspirin chewable tablet 324 mg (324 mg Oral Given 02/27/23 1631)                                                                                                                                     Procedures Procedures  (including critical care time)  Medical Decision Making / ED Course    Medical Decision Making:    Zeus Propp is a 72 y.o. male with past medical history as below, significant for HTN, colitis, macular degeneration, psoriasis who presents to the ED with complaint of cp/shoulder pain. . The complaint involves an extensive differential diagnosis and also carries with it a high risk of complications  and morbidity.  Serious etiology was considered. Ddx includes but is not limited to: Differential includes all life-threatening causes for chest pain. This includes but is not exclusive to acute coronary syndrome, aortic dissection, pulmonary embolism, cardiac tamponade, community-acquired pneumonia, pericarditis, musculoskeletal chest wall pain, etc.   Complete initial physical exam performed, notably the patient  was asymptomatic, no distress, HDS.    Reviewed and confirmed nursing documentation for past medical history, family history, social history.  Vital signs reviewed.    Clinical Course as of 02/27/23 2020  Mon Feb 27, 2023  1627 Remains asymptomatic  [SG]  1627 Trop x1 was negative, EKG stable, CXR stable [SG]    Clinical Course User Index [SG] Sloan Leiter, DO    Delta troponin was negative, remainder of labs negative.  He remains asymptomatic.  Chest x-ray and EKG unremarkable.  Reviewed prior left heart cath from last year, cardiology office notes.  Patient is not having any exertional chest pain, pain appears sporadic and more musculoskeletal in nature, likely atypical.  I have reduced suspicion for ACS but given patient's cardiac history would recommend close a patient follow-up with his cardiologist in the office. Follows w/ Dr Bjorn Pippin; referral placed  The patient's chest pain is not suggestive of pulmonary embolus, cardiac ischemia, aortic dissection, pericarditis, myocarditis, pulmonary embolism, pneumothorax, pneumonia, Zoster, or esophageal perforation, or other serious etiology.  Historically not abrupt in onset, tearing or ripping, pulses symmetric. EKG nonspecific for ischemia/infarction. No dysrhythmias, brugada, WPW, prolonged QT noted.   Troponin negative x2. CXR reviewed. Labs without demonstration of acute pathology unless otherwise noted above. HEART score is 3-4.  Patient in no distress and overall condition improved here in the ED. Detailed discussions  were had  with the patient regarding current findings, and need for close f/u with PCP and cardiology. The patient has been instructed to return immediately if the symptoms worsen in any way for re-evaluation. Patient verbalized understanding and is in agreement with current care plan. All questions answered prior to discharge.       Additional history obtained: -Additional history obtained from spouse -External records from outside source obtained and reviewed including: Chart review including previous notes, labs, imaging, consultation notes including prior labs and imaging, medications, Left heart cath 9/23; LAD stenosis 85% Long term event monitor reviewed 8/23, per Dr. Bjorn Pippin mx episodes of SVT   Lab Tests: -I ordered, reviewed, and interpreted labs.   The pertinent results include:   Labs Reviewed  BASIC METABOLIC PANEL - Abnormal; Notable for the following components:      Result Value   BUN 26 (*)    All other components within normal limits  CBC - Abnormal; Notable for the following components:   Platelets 142 (*)    All other components within normal limits  CBG MONITORING, ED  TROPONIN I (HIGH SENSITIVITY)  TROPONIN I (HIGH SENSITIVITY)    Notable for as above trop neg x2  EKG   EKG Interpretation  Date/Time:  Monday February 27 2023 14:57:30 EDT Ventricular Rate:  67 PR Interval:  140 QRS Duration: 82 QT Interval:  370 QTC Calculation: 390 R Axis:   57 Text Interpretation: Normal sinus rhythm Normal ECG When compared with ECG of 20-May-2022 09:14, No significant change was found Confirmed by Tanda Rockers ([id number]) on 02/27/2023 3:10:32 PM         Imaging Studies ordered: I ordered imaging studies including cxr I independently visualized the following imaging with scope of interpretation limited to determining acute life threatening conditions related to emergency care; findings noted above, significant for wnl I independently visualized and interpreted  imaging. I agree with the radiologist interpretation   Medicines ordered and prescription drug management: Meds ordered this encounter  Medications   acetaminophen (TYLENOL) tablet 650 mg   aspirin chewable tablet 324 mg    -I have reviewed the patients home medicines and have made adjustments as needed   Consultations Obtained: na   Cardiac Monitoring: The patient was maintained on a cardiac monitor.  I personally viewed and interpreted the cardiac monitored which showed an underlying rhythm of: sinus brady/NSR  Social Determinants of Health:  Diagnosis or treatment significantly limited by social determinants of health: former smoker   Reevaluation: After the interventions noted above, I reevaluated the patient and found that they have resolved  Co morbidities that complicate the patient evaluation  Past Medical History:  Diagnosis Date   Chicken pox    Colitis, ulcerative (HCC)    Hypertension    Macular degeneration, bilateral 07/2012   areds 2    Psoriasis       Dispostion: Disposition decision including need for hospitalization was considered, and patient discharged from emergency department.    Final Clinical Impression(s) / ED Diagnoses Final diagnoses:  Chest pain, unspecified type     This chart was dictated using voice recognition software.  Despite best efforts to proofread,  errors can occur which can change the documentation meaning.    Sloan Leiter, DO 02/27/23 2020

## 2023-03-02 ENCOUNTER — Encounter: Payer: Self-pay | Admitting: Family Medicine

## 2023-03-02 ENCOUNTER — Ambulatory Visit (INDEPENDENT_AMBULATORY_CARE_PROVIDER_SITE_OTHER): Payer: PPO | Admitting: Family Medicine

## 2023-03-02 ENCOUNTER — Telehealth: Payer: Self-pay

## 2023-03-02 VITALS — BP 120/76 | HR 62 | Temp 98.0°F | Ht 69.0 in | Wt 193.4 lb

## 2023-03-02 DIAGNOSIS — I10 Essential (primary) hypertension: Secondary | ICD-10-CM

## 2023-03-02 DIAGNOSIS — I2510 Atherosclerotic heart disease of native coronary artery without angina pectoris: Secondary | ICD-10-CM | POA: Diagnosis not present

## 2023-03-02 DIAGNOSIS — E785 Hyperlipidemia, unspecified: Secondary | ICD-10-CM

## 2023-03-02 NOTE — Patient Instructions (Addendum)
Short term I want you to take a full imdur 30 mg (isosorbide mononitrate) and see if this helps your pain in back or chest  I have let cardiology know you are having some more chest pain - please call them if you do not hear by Monday on an appointment . Worsening pain please seek care again.    want you to try heat 3-4x a day for 15-20 minutes.  Consider sports medicine if they do not think its your heart as I do think there could be musculoskeletal component to the back at least  Recommended follow up: Return for as needed for new, worsening, persistent symptoms. If worsening symptoms please seek care

## 2023-03-02 NOTE — Telephone Encounter (Signed)
     Patient  visit on 4/15  at [facility]  Have you been able to follow up with your primary care physician?yes   The patient was or was not able to obtain any needed medicine or equipment. Yes   Are there diet recommendations that you are having difficulty following? Na   Patient expresses understanding of discharge instructions and education provided has no other needs at this time.  Yes      Lenard Forth [facility] Guide, [facility] [phone] [address] [place], [place], [place] [address] Phone: [phone] Email: [email or web address]

## 2023-03-02 NOTE — Progress Notes (Signed)
Phone [phone] In person visit   Subjective:   Edward Goodwin is a 72 y.o. year old very pleasant male patient who presents for/with See problem oriented charting Chief Complaint  Patient presents with   Back pain    Intermittent, becoming more frequent. Started around 4/7.   Chest Pain    Intermittent, mostly left side but felt mid chest pain once today.   Left arm pain    Intermittent.    Past Medical History-  Patient Active Problem List   Diagnosis Date Noted   Paroxysmal atrial fibrillation 04/21/2022    Priority: High   Secondary hypercoagulable state 04/21/2022    Priority: High   Coronary artery disease 12/16/2021    Priority: High   Abdominal aortic aneurysm (AAA) without rupture, unspecified part     Priority: High   Ulcerative colitis 08/20/2007    Priority: High   Former smoker 04/19/2018    Priority: Medium    Hyperlipidemia 04/19/2018    Priority: Medium    Vertigo 12/19/2017    Priority: Medium    Gout 03/24/2014    Priority: Medium    Elevated PSA, less than 10 ng/ml 09/09/2013    Priority: Medium    Macular degeneration, bilateral 07/15/2012    Priority: Medium    Obesity (BMI 30.0-34.9) 12/02/2008    Priority: Medium    Essential hypertension 08/20/2007    Priority: Medium    PSORIASIS 08/20/2007    Priority: Medium    Onychomycosis 12/19/2017    Priority: Low   Trochanteric bursitis of left hip 03/31/2015    Priority: Low    Medications- reviewed and updated Current Outpatient Medications  Medication Sig Dispense Refill   acetaminophen (TYLENOL) 650 MG CR tablet Take 650 mg by mouth every 8 (eight) hours as needed for pain.     aflibercept (EYLEA HD) 8 MG/0.07ML SOLN by Intravitreal route.     apixaban (ELIQUIS) 5 MG TABS tablet Take 1 tablet (5 mg total) by mouth 2 (two) times daily. 180 tablet 1   Ascorbic Acid (VITAMIN C PO) Take 1 tablet by mouth daily. 1000 mg     atorvastatin (LIPITOR) 20 MG tablet TAKE 1 TABLET BY MOUTH EVERY  DAY 90 tablet 3   clobetasol ointment (TEMOVATE) 0.05 % Apply 1 Application topically 2 (two) times daily as needed (irritation).     diltiazem (CARDIZEM) 30 MG tablet Take one tablet by mouth every 4 hrs as needed, Heart rate needs to be above 100, top number blood pressure above 100 45 tablet 1   diphenhydramine-acetaminophen (TYLENOL PM) 25-500 MG TABS tablet Take 1 tablet by mouth at bedtime as needed (pain/sleep).     isosorbide mononitrate (IMDUR) 30 MG 24 hr tablet Take 0.5 tablets (15 mg total) by mouth daily. 45 tablet 3   L-Lysine 1000 MG TABS Take 1,000 mg by mouth daily.     lisinopril (ZESTRIL) 20 MG tablet Take 1 tablet (20 mg total) by mouth daily. 90 tablet 3   Multiple Vitamins-Minerals (PRESERVISION AREDS 2) CAPS Take 1 capsule by mouth 2 (two) times daily before lunch and supper.     nitroGLYCERIN (NITROSTAT) 0.4 MG SL tablet Place 1 tablet (0.4 mg total) under the tongue every 5 (five) minutes as needed. 25 tablet 3   Propylene Glycol (SYSTANE COMPLETE OP) Place 1 drop into both eyes 2 (two) times daily.     No current facility-administered medications for this visit.     Objective:  BP 120/76 (BP Location:  Left Arm, Patient Position: Sitting)   Pulse 62   Temp 98 F (36.7 C) (Temporal)   Ht  (1.753 m)   Wt 193 lb 6.4 oz (87.7 kg)   SpO2 97%   BMI 28.56 kg/m  Gen: NAD, resting comfortably CV: RRR no murmurs rubs or gallops Lungs: CTAB no crackles, wheeze, rhonchi MSK: Upper back pain between shoulder blade and spine on the left tender to palpation Abdomen: soft/nontender/nondistended/normal bowel sounds. No rebound or guarding.  Ext: no edema Skin: warm, dry     Assessment and Plan   # Back pain S: Patient reports symptoms started April 7. Left upper back pain towards the shoulder blade.   Was intermittent at first but seems to be becoming more frequent.  He describes this as sharp pain and yesterday pain up to over 5/10 and made him yelp even. Once or  twice seemed correlated with chest pain but not consistently.  Seems to come on and then leave. Changing lawnmower blade and exerting himself did cause some back pain the other day.  -can get random pains in the area even without movement. Typically not triggered by movement. Does not remember a strain.  A/P: Left upper back pain certainly could be musculoskeletal-he does have some tenderness with palpation.  With that being said the pain started around the time that his chest pain worsened and we discussed ruling out cardiac causes first-cardiology was kind enough to work him in for next Tuesday-see chest pain section as well  # Chest pain patient with history. #CAD- [facility] 07/22/2022 showed heavily calcified 85% mid LAD stenosis; medical management recommended and consider PCI worsening symptoms.  #hyperlipidemia  S: Medication:Atorvastatin 20 mg once daily, imdur 15 mg, eliquis  Patient complains of intermittent chest pain.  Typically notes this on the left side but did note some mid chest pain earlier today and lasted about a few seconds. Some of the other ones can last a minute or so.   -chest pain doesn't last long so hard to know if nitroglycerin is helpful but feels like it is.   He also has some intermittent left arm pain- "felt funny" and can hurt down the arm even when he was in the lobby today.    He had an emergency room visit on 02/27/2023-delta troponin trend was negative, labs unremarkable, was asymptomatic by time of discharge, chest x-ray and EKG were unremarkable.  They recommended PCP and cardiology follow-up-scheduled with cardiology in June.  They felt aneurysm less likely. He reports long term issues.   Lab Results  Component Value Date   CHOL 110 01/05/2023   HDL 45.60 01/05/2023   LDLCALC 47 01/05/2023   TRIG 84.0 01/05/2023   CHOLHDL 2 01/05/2023    A/P: Patient with some baseline intermittent chest pain but has worsened over the last few weeks and in particular since just  before emergency room visit and associated with some left arm pain at times.  Possible prior nonobstructive CAD is now obstructive - Reached out to cardiology and they were kind enough to work him in next Tuesday - Has already had reassuring acute workup in the emergency department but we discussed possibility although this is not an NSTEMI or STEMI that this could be related to his heart still (though does not have all classic features including duration of pain) - No acute changes to medication - No widened mediastinum on chest x-ray-doubt aneurysm with intermittent share of pain -We did opt to increase his Imdur to  30 mg until he sees cardiology to see if that is helpful and he can also experiment with nitroglycerin if having the back pain -Remains on Eliquis and we will hold off on aspirin for now  For hyperlipidemia lipids have been very well-controlled recently-continue current medication  #hypertension S: medication: Lisinopril 20 mg once daily  A/P: Controlled. Continue current medications.  Recommended follow up: Return for as needed for new, worsening, persistent symptoms. Future Appointments  Date Time Provider Department Center  03/07/2023  2:45 PM Azalee Course, [place] [facility] None  05/10/2023  4:00 PM Lennette Bihari, MD [facility] None  06/07/2023  8:00 AM Little Ishikawa, MD [facility] None  06/29/2023  8:20 AM Shelva Majestic, MD [facility]  02/05/2024  1:00 PM [facility] ANNUAL WELLNESS VISIT 1 [facility]    Lab/Order associations:   ICD-10-CM   1. Coronary artery disease involving native coronary artery of native heart without angina pectoris  I25.10     2. Essential hypertension  I10     3. Hyperlipidemia, unspecified hyperlipidemia type  E78.5       No orders of the defined types were placed in this encounter.   Return precautions advised.  Tana Conch, MD

## 2023-03-06 DIAGNOSIS — H353211 Exudative age-related macular degeneration, right eye, with active choroidal neovascularization: Secondary | ICD-10-CM | POA: Diagnosis not present

## 2023-03-07 ENCOUNTER — Ambulatory Visit: Payer: PPO | Attending: Physician Assistant | Admitting: Physician Assistant

## 2023-03-07 ENCOUNTER — Encounter: Payer: Self-pay | Admitting: Physician Assistant

## 2023-03-07 VITALS — BP 116/68 | HR 55 | Ht 69.0 in | Wt 195.2 lb

## 2023-03-07 DIAGNOSIS — G4733 Obstructive sleep apnea (adult) (pediatric): Secondary | ICD-10-CM | POA: Diagnosis not present

## 2023-03-07 DIAGNOSIS — I480 Paroxysmal atrial fibrillation: Secondary | ICD-10-CM

## 2023-03-07 DIAGNOSIS — I10 Essential (primary) hypertension: Secondary | ICD-10-CM

## 2023-03-07 DIAGNOSIS — I25119 Atherosclerotic heart disease of native coronary artery with unspecified angina pectoris: Secondary | ICD-10-CM | POA: Diagnosis not present

## 2023-03-07 DIAGNOSIS — E785 Hyperlipidemia, unspecified: Secondary | ICD-10-CM | POA: Diagnosis not present

## 2023-03-07 MED ORDER — ISOSORBIDE MONONITRATE ER 30 MG PO TB24: 30.0000 mg | ORAL_TABLET | Freq: Every day | ORAL | 3 refills | Status: DC

## 2023-03-07 NOTE — Patient Instructions (Signed)
Medication Instructions:   INCREASE Imdur to 30 mg daily   *If you need a refill on your cardiac medications before your next appointment, please call your pharmacy*  Lab Work: NONE ordered at this time of appointment   If you have labs (blood work) drawn today and your tests are completely normal, you will receive your results only by: MyChart Message (if you have MyChart) OR A paper copy in the mail If you have any lab test that is abnormal or we need to change your treatment, we will call you to review the results.  Testing/Procedures: NONE ordered at this time of appointment   Follow-Up: At [facility], you and your health needs are our priority.  As part of our continuing mission to provide you with exceptional heart care, we have created designated Provider Care Teams.  These Care Teams include your primary Cardiologist (physician) and Advanced Practice Providers (APPs -  Physician Assistants and Nurse Practitioners) who all work together to provide you with the care you need, when you need it.   Your next appointment:   As previously scheduled   Provider:   Little Ishikawa, MD -06/07/23 at 8:00 AM   Lennette Bihari, MD-05/10/23 at 4:00 PM  Other Instructions

## 2023-03-07 NOTE — Progress Notes (Unsigned)
Cardiology Office Note:    Date:  03/09/2023   ID:  Edward Goodwin, DOB 1951-08-03, MRN [id number]  PCP:  Edward Majestic, MD   [facility] Providers Cardiologist:  Edward Ishikawa, MD Sleep Medicine:  Edward Guadalajara, MD     Referring MD: Edward Majestic, MD   Chief Complaint  Patient presents with   Follow-up    Seen for Dr. Bjorn Goodwin    History of Present Illness:    Edward Goodwin is a 72 y.o. male with a hx of proximal atrial fibrillation, ulcerative colitis, hypertension, hyperlipidemia, CAD and history of psoriasis.  Patient was diagnosed with A-fib at ED visit in June 2023.  CHA2DS2-VASc score was 3 and he was started on Eliquis 5 mg twice a day as well as Cardizem as needed.  Patient reported he was dehydrated when he went into A-fib.  Echocardiogram on 05/02/2022 showed normal biventricular function, no significant valve issue.  Zio patch for 7 days in August 2023 showed normal A-fib, 49 episodes of SVT with longest lasting 12 seconds.  Coronary CT obtained 07/03/2022 showed severe stenosis in the mid to distal LAD with CT FFR 0.64, coronary calcium score 610 which placed the patient on 72nd percentile for age and sex matched controls.  Cardiac catheterization performed on 07/22/2022 showed heavily calcified 85% mid LAD stenosis, medical therapy was recommended and only consider PCI if worsening symptom.  Sleep study obtained in November 2023 suggestive of significant obstructive sleep apnea during REM sleep was O2 nadir of 79%.  He was started on CPAP therapy.  He was also referred to Dr. Jenne Goodwin ENT to evaluate deviated right septum.  He presented to the ED on 02/27/2023 for chest discomfort, this was felt to be musculoskeletal in nature.  Symptoms did not occur with physical activity.  In fact he went to golf on the following day without any issue.  His PCP has temporarily increase Imdur to 30 mg daily.  Patient presents today for follow-up.  Since last week's ED  visit, his chest pain has significantly improved.  His blood pressure is borderline.  He has not had any chest pain in the past 2 days.  He mentioned the left shoulder pain and back pain was reproducible with palpation during PCPs visit.  For the time being I recommended continue observation.  He would not be a good candidate for Myoview as it likely will come back abnormal.  Therefore, he will either be medical therapy versus going straight for cardiac catheterization.  He can keep his follow-up with Dr. Tresa Goodwin.  He has claustrophobia with traditional CPAP over the face mask, due to his nasal septum, he cannot wear the nasal pillow either.  He has upcoming visit with Dr. Jenne Goodwin of ENT in early May.  He can follow-up with Dr. Tresa Goodwin in June and that Dr. Gaynelle Goodwin for reassessment in July.  Past Medical History:  Diagnosis Date   Chicken pox    Colitis, ulcerative (HCC)    Hypertension    Macular degeneration, bilateral 07/2012   areds 2    Psoriasis     Past Surgical History:  Procedure Laterality Date   ACNE CYST REMOVAL     CATARACT EXTRACTION W/ INTRAOCULAR LENS IMPLANT  10/2014   bilat   COLONOSCOPY     FOOT SURGERY  1988   LEFT HEART CATH AND CORONARY ANGIOGRAPHY N/A 07/22/2022   Procedure: LEFT HEART CATH AND CORONARY ANGIOGRAPHY;  Surgeon: Edward Records, MD;  Location: [facility];  Service: Cardiovascular;  Laterality: N/A;   SHOULDER ARTHROSCOPY  2007   Right   TONSILLECTOMY      Current Medications: Current Meds  Medication Sig   acetaminophen (TYLENOL) 650 MG CR tablet Take 650 mg by mouth every 8 (eight) hours as needed for pain.   apixaban (ELIQUIS) 5 MG TABS tablet Take 1 tablet (5 mg total) by mouth 2 (two) times daily.   Ascorbic Acid (VITAMIN C PO) Take 1 tablet by mouth daily. 1000 mg   atorvastatin (LIPITOR) 20 MG tablet TAKE 1 TABLET BY MOUTH EVERY DAY   clobetasol ointment (TEMOVATE) 0.05 % Apply 1 Application topically 2 (two) times daily as needed (irritation).    diltiazem (CARDIZEM) 30 MG tablet Take one tablet by mouth every 4 hrs as needed, Heart rate needs to be above 100, top number blood pressure above 100   diphenhydramine-acetaminophen (TYLENOL PM) 25-500 MG TABS tablet Take 1 tablet by mouth at bedtime as needed (pain/sleep).   faricimab-svoa (VABYSMO) 6 MG/0.05ML SOLN intravitreal injection 6 mg by Intravitreal route once.   L-Lysine 1000 MG TABS Take 1,000 mg by mouth daily.   lisinopril (ZESTRIL) 20 MG tablet Take 1 tablet (20 mg total) by mouth daily.   Multiple Vitamins-Minerals (PRESERVISION AREDS 2) CAPS Take 1 capsule by mouth 2 (two) times daily before lunch and supper.   nitroGLYCERIN (NITROSTAT) 0.4 MG SL tablet Place 1 tablet (0.4 mg total) under the tongue every 5 (five) minutes as needed.   Propylene Glycol (SYSTANE COMPLETE OP) Place 1 drop into both eyes 2 (two) times daily.   [DISCONTINUED] isosorbide mononitrate (IMDUR) 30 MG 24 hr tablet Take 0.5 tablets (15 mg total) by mouth daily.     Allergies:   Sulfonamide derivatives   Social History   Socioeconomic History   Marital status: Married    Spouse name: Not on file   Number of children: 1   Years of education: 12   Highest education level: Associate degree: occupational, Scientist, product/process development, or vocational program  Occupational History   Occupation: Systems developer    Comment: retired   Tobacco Use   Smoking status: Former    Types: Cigarettes    Quit date: 10/23/1989    Years since quitting: 33.3   Smokeless tobacco: Never  Vaping Use   Vaping Use: Never used  Substance and Sexual Activity   Alcohol use: Not Currently    Comment: rare glass of wine - less than once a year   Drug use: No   Sexual activity: Not Currently  Other Topics Concern   Not on file  Social History Narrative   Married 1971. 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  [employer]  6 years.       ACP: HCPOA - wife. Yes for acute CPR, no for prolonged mechanical ventilatory support, no for prolonged artificial nutrition or other heroic measures leaving him in an incapacitated state.    Social Determinants of Health   Financial Resource Strain: Low Risk  (03/01/2023)   Overall Financial Resource Strain (CARDIA)    Difficulty of Paying Living Expenses: Not hard at all  Food Insecurity: No Food Insecurity (03/01/2023)   Hunger Vital Sign    Worried About Running Out of Food in the Last Year: Never true    Ran Out of Food in the  Last Year: Never true  Transportation Needs: No Transportation Needs (03/01/2023)   PRAPARE - Administrator, Civil Service (Medical): No    Lack of Transportation (Non-Medical): No  Physical Activity: Unknown (03/01/2023)   Exercise Vital Sign    Days of Exercise per Week: Patient declined    Minutes of Exercise per Session: 150+ min  Stress: No Stress Concern Present (03/01/2023)   [employer] of Occupational Health - Occupational Stress Questionnaire    Feeling of Stress : Not at all  Social Connections: Socially Integrated (03/01/2023)   Social Connection and Isolation Panel [NHANES]    Frequency of Communication with Friends and Family: More than three times a week    Frequency of Social Gatherings with Friends and Family: More than three times a week    Attends Religious Services: More than 4 times per year    Active Member of [employer] or Organizations: Yes    Attends Engineer, structural: More than 4 times per year    Marital Status: Married     Family History: The patient's family history includes Arthritis in his paternal grandmother; Breast cancer in his mother; Dementia in his mother; Diabetes in his daughter and maternal uncle; Emphysema in his maternal grandfather; Heart disease in his father and mother; Hepatitis C in his brother; Hypertension in his father and mother; Hypothyroidism in his mother; Stroke in  his maternal grandmother and mother. There is no history of Colon cancer, Esophageal cancer, Stomach cancer, or Rectal cancer.  ROS:   Please see the history of present illness.     All other systems reviewed and are negative.  EKGs/Labs/Other Studies Reviewed:    The following studies were reviewed today:  Cath 07/22/2022 CONCLUSIONS: Heavily calcified segmental 85% LAD stenosis. Right dominant anatomy without any other significant stenoses. Normal LVEDP.   RECOMMENDATIONS:   Discussed management strategy with Dr. Epifanio Lesches.  With minimal symptoms, plan medical therapy for the time being (ischemia trial) and consider PCI if symptoms accelerate or clinical status changes (will require plaque modification with orbital atherectomy plus minus IVL) Aggressive antilipid therapy He is on Eliquis, and not sure if this will be long-term or temporary due to a recent episode of atrial fibrillation.  If PCI soon, will be on triple drug therapy for least a month that will put him at increased risk of bleeding.  EKG:  EKG is ordered today.  The ekg ordered today demonstrates normal sinus rhythm, no significant ST-T wave changes.  Recent Labs: 04/16/2022: B Natriuretic Peptide 402.8; Magnesium 2.1 06/15/2022: TSH 1.15 01/05/2023: ALT 10 02/27/2023: BUN 26; Creatinine, Ser 1.15; Hemoglobin 14.0; Platelets 142; Potassium 4.4; Sodium 141  Recent Lipid Panel    Component Value Date/Time   CHOL 110 01/05/2023 0850   TRIG 84.0 01/05/2023 0850   TRIG 58 09/01/2006 0749   HDL 45.60 01/05/2023 0850   CHOLHDL 2 01/05/2023 0850   VLDL 16.8 01/05/2023 0850   LDLCALC 47 01/05/2023 0850     Risk Assessment/Calculations:    CHA2DS2-VASc Score = 3   This indicates a 3.2% annual risk of stroke. The patient's score is based upon: CHF History: 0 HTN History: 1 Diabetes History: 0 Stroke History: 0 Vascular Disease History: 1 Age Score: 1 Gender Score: 0          Physical Exam:    VS:   BP 116/68 (BP Location: Left Arm, Patient Position: Sitting, Cuff Size: Large)   Pulse (!) 55   Ht 5'  9" (1.753 m)   Wt 195 lb 3.2 oz (88.5 kg)   SpO2 98%   BMI 28.83 kg/m        Wt Readings from Last 3 Encounters:  03/07/23 195 lb 3.2 oz (88.5 kg)  03/02/23 193 lb 6.4 oz (87.7 kg)  02/27/23 190 lb (86.2 kg)     GEN:  Well nourished, well developed in no acute distress HEENT: Normal NECK: No JVD; No carotid bruits LYMPHATICS: No lymphadenopathy CARDIAC: RRR, no murmurs, rubs, gallops RESPIRATORY:  Clear to auscultation without rales, wheezing or rhonchi  ABDOMEN: Soft, non-tender, non-distended MUSCULOSKELETAL:  No edema; No deformity  SKIN: Warm and dry NEUROLOGIC:  Alert and oriented x 3 PSYCHIATRIC:  Normal affect   ASSESSMENT:    1. Coronary artery disease involving native coronary artery of native heart with angina pectoris (HCC)   2. OSA (obstructive sleep apnea)   3. Essential hypertension   4. Hyperlipidemia LDL goal <70   5. PAF (paroxysmal atrial fibrillation) (HCC)    PLAN:    In order of problems listed above:  CAD: Recent chest pain was somewhat atypical and does not occur with physical activity.  His PCP has increased Imdur to 30 mg daily.  Will continue observation  OSA: Follow-up with Dr. Tresa Goodwin.  Could not tolerate full facial mask due to claustrophobia.  Has upcoming visit with Dr. Jenne Goodwin of ENT due to deviated septum  Hypertension: Blood pressure stable  Hyperlipidemia: On Lipitor  PAF: On Eliquis.           Medication Adjustments/Labs and Tests Ordered: Current medicines are reviewed at length with the patient today.  Concerns regarding medicines are outlined above.  Orders Placed This Encounter  Procedures   EKG 12-Lead   Meds ordered this encounter  Medications   isosorbide mononitrate (IMDUR) 30 MG 24 hr tablet    Sig: Take 1 tablet (30 mg total) by mouth daily.    Dispense:  90 tablet    Refill:  3    Dose change new Rx     Patient Instructions  Medication Instructions:   INCREASE Imdur to 30 mg daily   *If you need a refill on your cardiac medications before your next appointment, please call your pharmacy*  Lab Work: NONE ordered at this time of appointment   If you have labs (blood work) drawn today and your tests are completely normal, you will receive your results only by: MyChart Message (if you have MyChart) OR A paper copy in the mail If you have any lab test that is abnormal or we need to change your treatment, we will call you to review the results.  Testing/Procedures: NONE ordered at this time of appointment   Follow-Up: At [facility], you and your health needs are our priority.  As part of our continuing mission to provide you with exceptional heart care, we have created designated Provider Care Teams.  These Care Teams include your primary Cardiologist (physician) and Advanced Practice Providers (APPs -  Physician Assistants and Nurse Practitioners) who all work together to provide you with the care you need, when you need it.   Your next appointment:   As previously scheduled   Provider:   Little Ishikawa, MD -06/07/23 at 8:00 AM   Lennette Bihari, MD-05/10/23 at 4:00 PM  Other Instructions       Signed, Azalee Course, PA  03/09/2023 10:56 PM    [facility]

## 2023-03-09 ENCOUNTER — Encounter: Payer: Self-pay | Admitting: Physician Assistant

## 2023-03-18 ENCOUNTER — Other Ambulatory Visit: Payer: Self-pay | Admitting: Cardiology

## 2023-03-18 DIAGNOSIS — I480 Paroxysmal atrial fibrillation: Secondary | ICD-10-CM

## 2023-03-20 NOTE — Telephone Encounter (Signed)
Pt last saw Edward Goodwin, [place] on 03/07/23, last labs 02/27/23 Creat 1.15, age 72, weight 88.5kg, based on specified criteria pt is on appropriate dosage of Eliquis 5mg  BID for afib.  Will refill rx.

## 2023-03-22 DIAGNOSIS — J342 Deviated nasal septum: Secondary | ICD-10-CM | POA: Diagnosis not present

## 2023-03-22 DIAGNOSIS — G4733 Obstructive sleep apnea (adult) (pediatric): Secondary | ICD-10-CM | POA: Diagnosis not present

## 2023-03-29 ENCOUNTER — Telehealth: Payer: Self-pay | Admitting: Cardiovascular Disease

## 2023-03-29 NOTE — Telephone Encounter (Signed)
Patient is calling stating when he last saw Dr. Tresa Endo he advised him he wanted to see EN&T prior to getting a CPAP. He has now done that, and would like to go through with getting the CPAP. Please advise.

## 2023-03-31 NOTE — Telephone Encounter (Signed)
Follow Up:     Patient is calling back, says he is waiting to hear something.

## 2023-03-31 NOTE — Telephone Encounter (Signed)
Patient called stating he wanted to hold off on getting CPAP until he saw ENT. He states he had ENT and he would like to move forward with getting CPAP and what his next steps would be. Sleep study was done in February.

## 2023-04-04 DIAGNOSIS — H353211 Exudative age-related macular degeneration, right eye, with active choroidal neovascularization: Secondary | ICD-10-CM | POA: Diagnosis not present

## 2023-04-07 ENCOUNTER — Ambulatory Visit: Payer: PPO | Admitting: Cardiology

## 2023-04-14 ENCOUNTER — Encounter: Payer: Self-pay | Admitting: Cardiovascular Disease

## 2023-04-19 DIAGNOSIS — L570 Actinic keratosis: Secondary | ICD-10-CM | POA: Diagnosis not present

## 2023-04-19 DIAGNOSIS — D1801 Hemangioma of skin and subcutaneous tissue: Secondary | ICD-10-CM | POA: Diagnosis not present

## 2023-04-19 DIAGNOSIS — D225 Melanocytic nevi of trunk: Secondary | ICD-10-CM | POA: Diagnosis not present

## 2023-04-19 DIAGNOSIS — L821 Other seborrheic keratosis: Secondary | ICD-10-CM | POA: Diagnosis not present

## 2023-04-19 DIAGNOSIS — L400 Psoriasis vulgaris: Secondary | ICD-10-CM | POA: Diagnosis not present

## 2023-05-03 ENCOUNTER — Telehealth: Payer: Self-pay

## 2023-05-03 NOTE — Telephone Encounter (Signed)
Notified patient that CPAP order was placed and sent to [facility] 05/03/23. All questions(if any) were answered. Patient verbalized understanding.

## 2023-05-09 DIAGNOSIS — H35033 Hypertensive retinopathy, bilateral: Secondary | ICD-10-CM | POA: Diagnosis not present

## 2023-05-09 DIAGNOSIS — H353122 Nonexudative age-related macular degeneration, left eye, intermediate dry stage: Secondary | ICD-10-CM | POA: Diagnosis not present

## 2023-05-09 DIAGNOSIS — H43813 Vitreous degeneration, bilateral: Secondary | ICD-10-CM | POA: Diagnosis not present

## 2023-05-09 DIAGNOSIS — H353211 Exudative age-related macular degeneration, right eye, with active choroidal neovascularization: Secondary | ICD-10-CM | POA: Diagnosis not present

## 2023-05-10 ENCOUNTER — Ambulatory Visit: Payer: PPO | Attending: Cardiovascular Disease | Admitting: Cardiovascular Disease

## 2023-05-10 ENCOUNTER — Encounter: Payer: Self-pay | Admitting: Cardiovascular Disease

## 2023-05-10 VITALS — BP 124/84 | HR 61 | Ht 69.5 in | Wt 197.4 lb

## 2023-05-10 DIAGNOSIS — I10 Essential (primary) hypertension: Secondary | ICD-10-CM

## 2023-05-10 NOTE — Patient Instructions (Signed)
Medication Instructions:  Your physician recommends that you continue on your current medications as directed. Please refer to the Current Medication list given to you today.  *If you need a refill on your cardiac medications before your next appointment, please call your pharmacy*    Follow-Up: At [facility], you and your health needs are our priority.  As part of our continuing mission to provide you with exceptional heart care, we have created designated Provider Care Teams.  These Care Teams include your primary Cardiologist (physician) and Advanced Practice Providers (APPs -  Physician Assistants and Nurse Practitioners) who all work together to provide you with the care you need, when you need it.  We recommend signing up for the patient portal called "MyChart".  Sign up information is provided on this After Visit Summary.  MyChart is used to connect with patients for Virtual Visits (Telemedicine).  Patients are able to view lab/test results, encounter notes, upcoming appointments, etc.  Non-urgent messages can be sent to your provider as well.   To learn more about what you can do with MyChart, go to [email or web address].    Your next appointment:   4 month(s)  Provider:   Dr. Nicki Guadalajara

## 2023-05-10 NOTE — Progress Notes (Signed)
Cardiology Office Note    Date:  01/10/2023   ID:  Edward Goodwin, Edward Goodwin Oct 09, 1951, MRN [id number]  PCP:  Edward Olp, MD  Cardiologist:  Edward Majestic, MD (sleep); Dr. Oswaldo Goodwin  New sleep consultation and evaluation initially referred for sleep study by Edward So, PA .   History of Present Illness:  Edward Goodwin is a 72 y.o. male who is followed by Dr. Garret Goodwin for primary care.  He has a history of mild hearing loss with hearing aids, and has issues with a deviated nasal septum.  He has a history of CAD with a heavily calcified LAD stenosis on medical management, hypertension, psoriasis, ulcerative colitis and paroxysmal atrial fibrillation.  He was diagnosed with atrial fibrillation at an ER visit on April 16, 2022.  His CHA2DS2-VASc score was 3 and he was started on Eliquis 5 mg twice a day in addition to as needed diltiazem.  He was evaluated by Edward So PA in atrial fibrillation clinic and was referred to Dr. Oswaldo Goodwin for cardiology care.  Due to concerns for obstructive sleep apnea he was referred for a diagnostic polysomnogram which was done on October 11, 2022.  On his home study, his AHI was 9.6/h with RDI 10.6/h consistent with mild overall sleep apnea.  However, during REM sleep, sleep apnea/severe with an AHI of 31.5 and with supine sleep was even more severe with an AHI of 60.6/h.  He dropped his oxygen saturation to a nadir of 79% and he was noted to have loud snoring throughout the study.  He was referred for a CPAP titration trial on December 22, 2022.  The study was of voided midstream after he was only titrated to 7 cm of water.  Apparently he has significant issues with deviated septum and felt claustrophobic with his mask on.  He apparently had only tried a fullface mask over the nose.  It was recommended that he initiate CPAP auto therapy with an EPR of 3 at 7 to 14 cm of water with heated humidification.  He had been contacted by  [facility] to contact them to obtain a new machine.  However, he did not want to get a machine until he had an opportunity to see me in sleep clinic for evaluation.  He is here with his wife today in the office.  He typically goes to bed between 8 and 10 PM and wakes up at 6 AM.  States his sleep is not very good.  He has had issues with significant right deviated septum.  He has not been evaluated by an ENT doctor.  Presently, he continues to be on Eliquis for anticoagulation.  He is on isosorbide 15 mg and lisinopril 10 mg daily in addition to as needed diltiazem.  He is on atorvastatin 20 mg for hyperlipidemia.  He presents for initial sleep consultation and evaluation.   Past Medical History:  Diagnosis Date   Chicken pox    Colitis, ulcerative ([place])    Hypertension    Macular degeneration, bilateral 07/2012   areds 2    Psoriasis     Past Surgical History:  Procedure Laterality Date   ACNE CYST REMOVAL     CATARACT EXTRACTION W/ INTRAOCULAR LENS IMPLANT  10/2014   bilat   COLONOSCOPY     FOOT SURGERY  1988   LEFT HEART CATH AND CORONARY ANGIOGRAPHY N/A 07/22/2022   Procedure: LEFT HEART CATH AND CORONARY ANGIOGRAPHY;  Surgeon: Edward Goodwin,  Edward Dike, MD;  Location: [facility];  Service: Cardiovascular;  Laterality: N/A;   SHOULDER ARTHROSCOPY  2007   Right   TONSILLECTOMY      Current Medications: Outpatient Medications Prior to Visit  Medication Sig Dispense Refill   acetaminophen (TYLENOL) 650 MG CR tablet Take 650 mg by mouth every 8 (eight) hours as needed for pain.     aflibercept (EYLEA HD) 8 MG/0.07ML SOLN by Intravitreal route.     apixaban (ELIQUIS) 5 MG TABS tablet Take 1 tablet (5 mg total) by mouth 2 (two) times daily. 180 tablet 1   Ascorbic Acid (VITAMIN C PO) Take 1 tablet by mouth daily. 1000 mg     atorvastatin (LIPITOR) 20 MG tablet TAKE 1 TABLET BY MOUTH EVERY DAY 90 tablet 3   clobetasol ointment (TEMOVATE) [id number] % Apply 1 Application topically 2 (two) times  daily as needed (irritation).     diltiazem (CARDIZEM) 30 MG tablet Take one tablet by mouth every 4 hrs as needed, Heart rate needs to be above 100, top number blood pressure above 100 45 tablet 1   diphenhydramine-acetaminophen (TYLENOL PM) 25-500 MG TABS tablet Take 1 tablet by mouth at bedtime as needed (pain/sleep).     isosorbide mononitrate (IMDUR) 30 MG 24 hr tablet Take 0.5 tablets (15 mg total) by mouth daily. 45 tablet 3   L-Lysine 1000 MG TABS Take 1,000 mg by mouth daily.     lisinopril (ZESTRIL) 10 MG tablet Take 1 tablet (10 mg total) by mouth daily. (Patient taking differently: Take 20 mg by mouth daily.) 90 tablet 3   Multiple Vitamins-Minerals (PRESERVISION AREDS 2) CAPS Take 1 capsule by mouth 2 (two) times daily before lunch and supper.     nitroGLYCERIN (NITROSTAT) 0.4 MG SL tablet Place 1 tablet (0.4 mg total) under the tongue every 5 (five) minutes as needed. 25 tablet 3   Propylene Glycol (SYSTANE COMPLETE OP) Place 1 drop into both eyes 2 (two) times daily.     No facility-administered medications prior to visit.     Allergies:   Sulfonamide derivatives   Social History   Socioeconomic History   Marital status: Married    Spouse name: Not on file   Number of children: 1   Years of education: 12   Highest education level: Not on file  Occupational History   Occupation: Herbalist    Comment: retired   Tobacco Use   Smoking status: Former    Types: Cigarettes    Quit date: 10/23/1989    Years since quitting: 33.2   Smokeless tobacco: Never  Vaping Use   Vaping Use: Never used  Substance and Sexual Activity   Alcohol use: Yes    Alcohol/week: 0.0 standard drinks of alcohol    Comment: rare glass of wine - less than once a year   Drug use: No   Sexual activity: Not Currently  Other Topics Concern   Not on file  Social History Narrative   Married 1971. 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 [employer] 6 years.       ACP: HCPOA - wife. Yes for acute CPR, no for prolonged mechanical ventilatory support, no for prolonged artificial nutrition or other heroic measures leaving him in an incapacitated state.    Social Determinants of Health   Financial Resource Strain:  Low Risk  (01/27/2022)   Overall Financial Resource Strain (CARDIA)    Difficulty of Paying Living Expenses: Not hard at all  Food Insecurity: No Food Insecurity (01/27/2022)   Hunger Vital Sign    Worried About Running Out of Food in the Last Year: Never true    Ran Out of Food in the Last Year: Never true  Transportation Needs: No Transportation Needs (01/27/2022)   PRAPARE - Hydrologist (Medical): No    Lack of Transportation (Non-Medical): No  Physical Activity: Sufficiently Active (01/27/2022)   Exercise Vital Sign    Days of Exercise per Week: 2 days    Minutes of Exercise per Session: 150+ min  Stress: No Stress Concern Present (01/27/2022)   [place]    Feeling of Stress : Not at all  Social Connections: Moderately Integrated (01/27/2022)   Social Connection and Isolation Panel [NHANES]    Frequency of Communication with Friends and Family: More than three times a week    Frequency of Social Gatherings with Friends and Family: More than three times a week    Attends Religious Services: More than 4 times per year    Active Member of [employer] or Organizations: No    Attends Music therapist: Never    Marital Status: Married     Family History:  The patient's family history includes Arthritis in his paternal grandmother; Breast cancer in his mother; Dementia in his mother; Diabetes in his daughter and maternal uncle; Emphysema in his maternal grandfather; Heart disease in his father and mother; Hepatitis C in his brother; Hypertension in his  father and mother; Hypothyroidism in his mother; Stroke in his maternal grandmother and mother.   ROS General: Negative; No fevers, chills, or night sweats;  HEENT: Negative; No changes in vision or hearing, sinus congestion, difficulty swallowing Pulmonary: Negative; No cough, wheezing, shortness of breath, hemoptysis Cardiovascular: Negative; No chest pain, presyncope, syncope, palpitations GI: Negative; No nausea, vomiting, diarrhea, or abdominal pain GU: Negative; No dysuria, hematuria, or difficulty voiding Musculoskeletal: Negative; no myalgias, joint pain, or weakness Hematologic/Oncology: Negative; no easy bruising, bleeding Endocrine: Negative; no heat/cold intolerance; no diabetes Neuro: Negative; no changes in balance, headaches Skin: Negative; No rashes or skin lesions Psychiatric: Negative; No behavioral problems, depression Sleep: See HPI: Positive for mild overall sleep apnea which is severe during REM sleep in the supine position.  Positive for snoring, an Epworth Sleepiness Scale score was calculated in the office today and this endorsed at 3 arguing against residual daytime sleepiness.   No bruxism, restless legs, hypnogognic hallucinations, no cataplexy Other comprehensive 14 point system review is negative.   PHYSICAL EXAM:   VS:  BP (!) 118/56 (BP Location: Left Arm, Patient Position: Sitting, Cuff Size: Normal)   Pulse 63   Ht '5\' 9"'$  (1.753 m)   Wt 190 lb 3.2 oz (86.3 kg)   SpO2 97%   BMI 28.09 kg/m     Repeat blood pressure by me was 116/66.  Wt Readings from Last 3 Encounters:  01/06/23 190 lb 3.2 oz (86.3 kg)  01/05/23 188 lb 3.2 oz (85.4 kg)  12/22/22 195 lb (88.5 kg)    General: Alert, oriented, no distress.  Skin: normal turgor, no rashes, warm and dry HEENT: Normocephalic, atraumatic. Pupils equal round and reactive to light; sclera anicteric; extraocular muscles intact;  Nose without nasal septal hypertrophy Mouth/Parynx benign; Mallinpatti scale  3 Neck: No JVD,  no carotid bruits; normal carotid upstroke Lungs: clear to ausculatation and percussion; no wheezing or rales Chest wall: without tenderness to palpitation Heart: PMI not displaced, RRR, s1 s2 normal, 1/6 systolic murmur, no diastolic murmur, no rubs, gallops, thrills, or heaves Abdomen: soft, nontender; no hepatosplenomehaly, BS+; abdominal aorta nontender and not dilated by palpation. Back: no CVA tenderness Pulses 2+ Musculoskeletal: full range of motion, normal strength, no joint deformities Extremities: no clubbing cyanosis or edema, Homan's sign negative  Neurologic: grossly nonfocal; Cranial nerves grossly wnl Psychologic: Normal mood and affect   Studies/Labs Reviewed:   EKG Interpretation  Date/Time:  Wednesday May 10 2023 16:02:59 EDT Ventricular Rate:  61 PR Interval:  164 QRS Duration: 80 QT Interval:  392 QTC Calculation: 394 R Axis:   51 Text Interpretation: Sinus rhythm with marked sinus arrhythmia When compared with ECG of 27-Feb-2023 14:57, No significant change was found Confirmed by Nicki Guadalajara ([id number]) on 05/10/2023 4:57:54 PM       January 06, 2023 ECG (independently read by me): NSR at 63, no ectopy  Recent Labs:    Latest Ref Rng & Units 01/05/2023    8:50 AM 07/15/2022    9:12 AM 06/20/2022    2:37 PM  BMP  Glucose 70 - 99 mg/dL 89  78  90   BUN 6 - 23 mg/dL 16  18  19    Creatinine 0.40 - 1.50 mg/dL 2.53  6.64  4.03   BUN/Creat Ratio 10 - 24  18  18    Sodium 135 - 145 mEq/L 142  139  140   Potassium 3.5 - 5.1 mEq/L 4.1  4.6  4.5   Chloride 96 - 112 mEq/L 106  102  101   CO2 19 - 32 mEq/L 30  25  25    Calcium 8.4 - 10.5 mg/dL 9.3  9.4  9.1         Latest Ref Rng & Units 01/05/2023    8:50 AM 04/16/2022    8:07 AM 12/16/2021   10:31 AM  Hepatic Function  Total Protein 6.0 - 8.3 g/dL 6.4  6.8  6.5   Albumin 3.5 - 5.2 g/dL 4.6  4.3  4.5   AST 0 - 37 U/L 13  12  13    ALT 0 - 53 U/L 10  9  8    Alk Phosphatase 39 - 117 U/L 69  55  66    Total Bilirubin 0.2 - 1.2 mg/dL 1.1  0.9  1.0        Latest Ref Rng & Units 01/05/2023    8:50 AM 07/15/2022    9:12 AM 06/15/2022    9:39 AM  CBC  WBC 4.0 - 10.5 K/uL 4.7  4.0  4.6   Hemoglobin 13.0 - 17.0 g/dL 47.4  25.9  56.3   Hematocrit 39.0 - 52.0 % 42.0  42.3  39.2   Platelets 150.0 - 400.0 K/uL 137.0  144  129.0    Lab Results  Component Value Date   MCV 90.4 01/05/2023   MCV 86 07/15/2022   MCV 85.4 06/15/2022   Lab Results  Component Value Date   TSH 1.15 06/15/2022   Lab Results  Component Value Date   HGBA1C 5.0 09/05/2013     BNP    Component Value Date/Time   BNP 402.8 (H) 04/16/2022 0807    ProBNP No results found for: "PROBNP"   Lipid Panel     Component Value Date/Time   CHOL 110 01/05/2023 0850  TRIG 84.0 01/05/2023 0850   TRIG 58 09/01/2006 0749   HDL 45.60 01/05/2023 0850   CHOLHDL 2 01/05/2023 0850   VLDL 16.8 01/05/2023 0850   LDLCALC 47 01/05/2023 0850     RADIOLOGY: SLEEP STUDY DOCUMENTS  Result Date: 12/29/2022 Ordered by an unspecified provider.    Additional studies/ records that were reviewed today include:    Patient Name: Alston, Berrie Date: 10/11/2022 Gender: Male D.O.B: 1950-12-20 Age (years): 2 Referring Provider: Alphonzo Severance PA Height (inches): 69 Interpreting Physician: Nicki Guadalajara MD, ABSM Weight (lbs): 195 RPSGT: Rosette Reveal BMI: 29 MRN: [id number] Neck Size: 16.00   CLINICAL INFORMATION Sleep Study Type: NPSG   Indication for sleep study: Hypertension, CAD, PAF, snoring   Epworth Sleepiness Score: 3   SLEEP STUDY TECHNIQUE As per the AASM Manual for the Scoring of Sleep and Associated Events v2.3 (April 2016) with a hypopnea requiring 4% desaturations.   The channels recorded and monitored were frontal, central and occipital EEG, electrooculogram (EOG), submentalis EMG (chin), nasal and oral airflow, thoracic and abdominal wall motion, anterior tibialis EMG, snore microphone,  electrocardiogram, and pulse oximetry.   MEDICATIONS acetaminophen (TYLENOL) 650 MG CR tablet apixaban (ELIQUIS) 5 MG TABS tablet Ascorbic Acid (VITAMIN C PO) aspirin EC 81 MG tablet atorvastatin (LIPITOR) 20 MG tablet clobetasol ointment (TEMOVATE) 0.05 % diltiazem (CARDIZEM) 30 MG tablet diphenhydramine-acetaminophen (TYLENOL PM) 25-500 MG TABS tablet faricimab-svoa (VABYSMO) 6 MG/0.05ML SOLN intravitreal injection isosorbide mononitrate (IMDUR) 30 MG 24 hr tablet L-Lysine 1000 MG TABS lisinopril (ZESTRIL) 20 MG tablet Multiple Vitamins-Minerals (PRESERVISION AREDS 2) CAPS nitroGLYCERIN (NITROSTAT) 0.4 MG SL tablet (Expired) Propylene Glycol (SYSTANE COMPLETE OP) Medications self-administered by patient taken the night of the study : TYLENOL PM   SLEEP ARCHITECTURE The study was initiated at 9:53:22 PM and ended at 4:21:24 AM.   Sleep onset time was 7.8 minutes and the sleep efficiency was 82.0%. The total sleep time was 318.2 minutes.   Stage REM latency was 60.0 minutes.   The patient spent 5.7% of the night in stage N1 sleep, 66.8% in stage N2 sleep, 0.0% in stage N3 and 27.5% in REM.   Alpha intrusion was absent.   Supine sleep was 14.93%.   RESPIRATORY PARAMETERS The overall apnea/hypopnea index (AHI) was 9.6 per hour. The respiratory disturbance index (RDI) was 10.6/h. There were 24 total apneas, including 23 obstructive, 1 central and 0 mixed apneas. There were 27 hypopneas and 5 RERAs.   The AHI during Stage REM sleep was 31.5 per hour.   AHI while supine was 60.6 per hour.   The mean oxygen saturation was 93.3%. The minimum SpO2 during sleep was 79.0%.   Loud snoring was noted during this study.   CARDIAC DATA The 2 lead EKG demonstrated sinus rhythm. The mean heart rate was 50.8 beats per minute. Other EKG findings include: None.   LEG MOVEMENT DATA The total PLMS were 0 with a resulting PLMS index of 0.0. Associated arousal with leg movement index was 0.8  .   IMPRESSIONS - Mild obstructive sleep apnea overall (AHI 9.6/h; RDI 10.6/h): however, sleep apnea was severe during REM sleep (AHI 31.5/h) and very severe with supine sleep (AHI 60.6/h). - No significant central sleep apnea occurred during this study (CAI 0.2/h). - Significant oxygen desaturation to a nadir of 79.0%. - The patient snored with loud snoring volume. - No cardiac abnormalities were noted during this study. - Clinically significant periodic limb movements did not occur during sleep. No significant associated  arousals. - DIAGNOSIS - Obstructive Sleep Apnea (G47.33) - Nocturnal Hypoxemia (G47.36)   RECOMMENDATIONS - Therapeutic CPAP titration to determine optimal pressure required to alleviate sleep disordered breathing. In this patient with cardiovascular comorbidities recommend an in-lab CPAP titration; if unable, then initiate AUto PAP with EPR of 3 at 6 - 18 cm of water. - Effort should be made to otpimize nasal and oropharyngeal patency. - Positional therapy avoiding supine position during sleep. - Avoid alcohol, sedatives and other CNS depressants that may worsen sleep apnea and disrupt normal sleep architecture. - Sleep hygiene should be reviewed to assess factors that may improve sleep quality. - Weight management and regular exercise should be initiated or continued if appropriate. - Recommend a download and sleep clinic evaluation after 4 weeks of therapy.    CPAP TITRATION: 12/22/2022 CLINICAL INFORMATION The patient is referred for a CPAP titration to treat sleep apnea.   Date of NPSG: 10/11/2022:  AHI 9.6/h; RDI 10.6/h; REM AHI 31.5/h; supine AHI 60.6/h; O2 nadir 79%.   SLEEP STUDY TECHNIQUE As per the AASM Manual for the Scoring of Sleep and Associated Events v2.3 (April 2016) with a hypopnea requiring 4% desaturations.   The channels recorded and monitored were frontal, central and occipital EEG, electrooculogram (EOG), submentalis EMG (chin), nasal and  oral airflow, thoracic and abdominal wall motion, anterior tibialis EMG, snore microphone, electrocardiogram, and pulse oximetry. Continuous positive airway pressure (CPAP) was initiated at the beginning of the study and titrated to treat sleep-disordered breathing.   MEDICATIONS acetaminophen (TYLENOL) 650 MG CR tablet aflibercept (EYLEA HD) 8 MG/0.07ML SOLN apixaban (ELIQUIS) 5 MG TABS tablet Ascorbic Acid (VITAMIN C PO) atorvastatin (LIPITOR) 20 MG tablet clobetasol ointment (TEMOVATE) 0.05  diltiazem (CARDIZEM) 30 MG tablet diphenhydramine-acetaminophen (TYLENOL PM) 25-500 MG TABS tablet faricimab-svoa (VABYSMO) 6 MG/0.05ML SOLN intravitreal injection isosorbide mononitrate (IMDUR) 30 MG 24 hr tablet L-Lysine 1000 MG TAB lisinopril (ZESTRIL) 10 MG tablet Multiple Vitamins-Minerals (PRESERVISION AREDS 2) CAP nitroGLYCERIN (NITROSTAT) 0.4 MG SL tablet Propylene Glycol (SYSTANE COMPLETE OP) Medications self-administered by patient taken the night of the study : TYLENOL PM   TECHNICIAN COMMENTS Comments added by technician: Patient had difficulty initiating sleep. Patient could not tolerate CPAP. Study was inconclusive Comments added by scorer: N/A   RESPIRATORY PARAMETERS Optimal PAP Pressure (cm):  7          AHI at Optimal Pressure (/hr):            2.1 Overall Minimal O2 (%):         85.0     Supine % at Optimal Pressure (%):    18 Minimal O2 at Optimal Pressure (%): 85.0        SLEEP ARCHITECTURE The study was initiated at 10:58:18 PM and ended at 3:29:08 AM.   Sleep onset time was 0.5 minutes and the sleep efficiency was 83.3%. The total sleep time was 225.5 minutes.   The patient spent 1.8% of the night in stage N1 sleep, 91.4% in stage N2 sleep, 0.0% in stage N3 and 6.9% in REM.Stage REM latency was 150.0 minutes   Wake after sleep onset was 44.8. Alpha intrusion was absent. Supine sleep was 10.28%.   CARDIAC DATA The 2 lead EKG demonstrated sinus rhythm. The mean heart  rate was 95.1 beats per minute. Other EKG findings include: None.   LEG MOVEMENT DATA The total Periodic Limb Movements of Sleep (PLMS) were 0. The PLMS index was 0.0. A PLMS index of <15 is considered normal in adults.  IMPRESSIONS - CPAP was initiated at 5 cm and was titrated to only 7 cm of water. (AHI 2.1/h; O2 nadir 85%) - Moderate oxygen desaturations were observed during this titration to a nadir of 85.0% at 7 cm. - No snoring was audible during this study. - No cardiac abnormalities were observed during this study. - Clinically significant periodic limb movements were not noted during this study. Arousals associated with PLMs were rare.   DIAGNOSIS - Obstructive Sleep Apnea (G47.33)   RECOMMENDATIONS - Recommend an initial trial of CPAP Auto therapy with EPR of 3 at 7 - 14 cm H2O with heated humidification. A large size Fisher&Paykel Full Face Simplus mask was used for the titration. - Effort should be made to optimize nasal and orophayngeal patency. - Avoid alcohol, sedatives and other CNS depressants that may worsen sleep apnea and disrupt normal sleep architecture. - Sleep hygiene should be reviewed to assess factors that may improve sleep quality. - Weight management and regular exercise should be initiated or continued. - Recommend a download and sleep clinic evaluation after 4 weeks of therapy.    ASSESSMENT:    1. Paroxysmal atrial fibrillation (HCC)   2. Obstructive sleep apnea (adult) (pediatric)   3. Essential hypertension   4. Hypoxemia associated with sleep   5. Secondary hypercoagulable state (HCC)   6. Deviated septum   7. CAD in native artery     PLAN:  Mr. Kaius Daino is a 72 year old gentleman who has a history of CAD, hypertension, and recently had developed an episode of atrial fibrillation leading to an ER at [facility] and on April 16, 2022.  In addition he has a history of ulcerative colitis, psoriasis, and has been documented to have significant deviated  septum.  Following his episode of atrial fibrillation, he is referred for a sleep study by Alphonzo Severance, PA in the atrial fibrillation clinic.  I had an extensive discussion with him today and reviewed his initial home study which showed mild overall sleep apnea; however sleep apnea was severe and REM sleep and very severe in the supine position.  He recently tried to undergo CPAP titration.  However, the study had to be aborted midway through due to his difficulty with claustrophobia with the fullface mask and he felt as if he was not getting adequate oxygenation.  I had a lengthy discussion with him today regarding sleep apnea and its potential effects on normal sleep architecture and potential adverse cardiovascular consequences is left untreated.  I reviewed with him data with reference to its implications and hypertension, development of potential nocturnal arrhythmias, increased risk for atrial fibrillation and particularly increased risk of developing recurrent AF if sleep apnea is untreated following successful cardioversion.  In addition I discussed potential negative adverse effects with reference to insulin resistance, increased risk for nocturnal GERD as well as increased inflammatory markers.  Particularly with his coronary artery disease, we discussed potential for nocturnal hypoxemia contributing to nocturnal ischemia.  I believe some of his difficulty was using the fullface mask.  I presented him different mask options today.  However, a big concern is that he has apparently significant deviated septum with significantly reduced nasal aperture.  He is hesitant to initiate CPAP therapy due to his claustrophobia and breathing difficulties.  He has been referred to Dr. Jenne Pane for ENT evaluation and I encouraged evaluation particularly if he is in need for repair of his deviated septum and turbinate reduction.  We can then potentially consider a follow-up evaluation or initiation  of CPAP therapy if he  feels he can breathe more appropriately and have less potential claustrophobic risk.  We also discussed alternative treatment modalities.  I answered all his questions and will see him in follow-up of his ENT evaluation and treatment as indicated.  Medication Adjustments/Labs and Tests Ordered: Current medicines are reviewed at length with the patient today.  Concerns regarding medicines are outlined above.  Medication changes, Labs and Tests ordered today are listed in the Patient Instructions below. Patient Instructions  Medication Instructions:   No changes    Lab Work: Not need   Testing/Procedures: Not needed   Follow-Up: At [facility], you and your health needs are our priority.  As part of our continuing mission to provide you with exceptional heart care, we have created designated Provider Care Teams.  These Care Teams include your primary Cardiologist (physician) and Advanced Practice Providers (APPs -  Physician Assistants and Nurse Practitioners) who all work together to provide you with the care you need, when you need it.     Your next appointment:   5 month(s)  The format for your next appointment:   In Person  Provider:   Dr Nicki Guadalajara     Other Instructions  Please contact office once a decision is made in regards to using a C-PAP     Signed, Nicki Guadalajara, MD  01/10/2023 4:15 PM    [facility] [address], [address], [place], [place]  [address] Phone: [phone]

## 2023-05-11 ENCOUNTER — Encounter: Payer: Self-pay | Admitting: Cardiovascular Disease

## 2023-05-17 ENCOUNTER — Telehealth: Payer: Self-pay

## 2023-05-17 DIAGNOSIS — G4733 Obstructive sleep apnea (adult) (pediatric): Secondary | ICD-10-CM

## 2023-05-17 NOTE — Telephone Encounter (Signed)
Placed CPAP prescription in Epic for DME 05/17/23.

## 2023-05-26 ENCOUNTER — Other Ambulatory Visit: Payer: Self-pay | Admitting: Cardiology

## 2023-06-04 NOTE — Progress Notes (Unsigned)
Cardiology Office Note:    Date:  06/07/2023   ID:  Edward Goodwin, DOB 13-Mar-1951, MRN [id number]  PCP:  Shelva Majestic, MD  Cardiologist:  Little Ishikawa, MD  Electrophysiologist:  None   Referring MD: Shelva Majestic, MD   Chief Complaint  Patient presents with   Coronary Artery Disease    History of Present Illness:    Edward Goodwin is a 72 y.o. male with a hx of CAD, paroxysmal atrial fibrillation, ulcerative colitis, hypertension, psoriasis who presents for follow-up.  He was referred by Alphonzo Severance, PA for evaluation of atrial fibrillation.  He was diagnosed with A-fib at ED visit 04/16/2022.  CHA2DS2-VASc score 3, was started on Eliquis 5 mg twice daily as well as diltiazem 30 mg as needed for palpitations/tachycardia.  He reports that he was dehydrated when he went into A-fib.  He felt tired but did not note any palpitations.  His wife checked his pulse and noted it was fast and irregular.  He reports occasional lightheadedness but denies any syncope.  Does report he has occasional chest pain.  Describes tightness in center of his chest.  Has not noted relationship with exertion.  He plays golf twice per week for exercise.  No lower extremity edema.  He is taking Eliquis, denies any bleeding issues.  Smoked for 20 years, quit in his early 26s.  Thinks father had MI in 27s.  Echocardiogram 05/02/2022 showed normal biventricular function, no significant valvular disease.  Coronary CTA on 07/11/2022 showed severe stenosis in mid to distal LAD (CT FFR 0.64), calcium score 610 (72nd percentile).  LHC 07/22/2022 showed heavily calcified 85% mid LAD stenosis; medical management recommended and consider PCI worsening symptoms.  Since last clinic visit, he reports he is doing well.  States that he has not had to use nitroglycerin recently, but maybe uses 1-2 times per month, typically chest pain occurs with walking.  However states his main exercise is playing golf twice per week and  will sometimes walk the course and has not noted chest pain with this.  He denies any dyspnea, lower extremity edema, or palpitations.  Reports occasional lightheadedness with standing, denies any syncope.  He is taking Eliquis, denies any bleeding issues.   Past Medical History:  Diagnosis Date   Chicken pox    Colitis, ulcerative (HCC)    Hypertension    Macular degeneration, bilateral 07/2012   areds 2    Psoriasis     Past Surgical History:  Procedure Laterality Date   ACNE CYST REMOVAL     CATARACT EXTRACTION W/ INTRAOCULAR LENS IMPLANT  10/2014   bilat   COLONOSCOPY     FOOT SURGERY  1988   LEFT HEART CATH AND CORONARY ANGIOGRAPHY N/A 07/22/2022   Procedure: LEFT HEART CATH AND CORONARY ANGIOGRAPHY;  Surgeon: Lyn Records, MD;  Location: [facility];  Service: Cardiovascular;  Laterality: N/A;   SHOULDER ARTHROSCOPY  2007   Right   TONSILLECTOMY      Current Medications: Current Meds  Medication Sig   acetaminophen (TYLENOL) 650 MG CR tablet Take 650 mg by mouth every 8 (eight) hours as needed for pain.   Ascorbic Acid (VITAMIN C PO) Take 1 tablet by mouth daily. 1000 mg   atorvastatin (LIPITOR) 20 MG tablet TAKE 1 TABLET BY MOUTH EVERY DAY   clobetasol ointment (TEMOVATE) 0.05 % Apply 1 Application topically 2 (two) times daily as needed (irritation).   diltiazem (CARDIZEM) 30 MG tablet  Take one tablet by mouth every 4 hrs as needed, Heart rate needs to be above 100, top number blood pressure above 100   diphenhydramine-acetaminophen (TYLENOL PM) 25-500 MG TABS tablet Take 1 tablet by mouth at bedtime as needed (pain/sleep).   ELIQUIS 5 MG TABS tablet TAKE 1 TABLET BY MOUTH TWICE A DAY   faricimab-svoa (VABYSMO) 6 MG/0.05ML SOLN intravitreal injection 6 mg by Intravitreal route once.   isosorbide mononitrate (IMDUR) 30 MG 24 hr tablet Take 1 tablet (30 mg total) by mouth daily.   L-Lysine 1000 MG TABS Take 1,000 mg by mouth daily.   lisinopril (ZESTRIL) 20 MG  tablet TAKE 1 TABLET BY MOUTH EVERY DAY   Multiple Vitamins-Minerals (PRESERVISION AREDS 2) CAPS Take 1 capsule by mouth 2 (two) times daily before lunch and supper.   nitroGLYCERIN (NITROSTAT) 0.4 MG SL tablet Place 1 tablet (0.4 mg total) under the tongue every 5 (five) minutes as needed.   Propylene Glycol (SYSTANE COMPLETE OP) Place 1 drop into both eyes 2 (two) times daily.     Allergies:   Sulfonamide derivatives   Social History   Socioeconomic History   Marital status: Married    Spouse name: Not on file   Number of children: 1   Years of education: 12   Highest education level: Associate degree: occupational, Scientist, product/process development, or vocational program  Occupational History   Occupation: Systems developer    Comment: retired   Tobacco Use   Smoking status: Former    Current packs/day: 0.00    Types: Cigarettes    Quit date: 10/23/1989    Years since quitting: 33.6   Smokeless tobacco: Never  Vaping Use   Vaping status: Never Used  Substance and Sexual Activity   Alcohol use: Not Currently    Comment: rare glass of wine - less than once a year   Drug use: No   Sexual activity: Not Currently  Other Topics Concern   Not on file  Social History Narrative   Married 1971. 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 [employer] 6 years.       ACP: HCPOA - wife. Yes for acute CPR, no for prolonged mechanical ventilatory support, no for prolonged artificial nutrition or other heroic measures leaving him in an incapacitated state.    Social Determinants of Health   Financial Resource Strain: Low Risk  (03/01/2023)   Overall Financial Resource Strain (CARDIA)    Difficulty of Paying Living Expenses: Not hard at all  Food Insecurity: No Food Insecurity (03/01/2023)   Hunger Vital Sign    Worried About Running Out of Food in the Last Year: Never true    Ran Out of Food in  the Last Year: Never true  Transportation Needs: No Transportation Needs (03/01/2023)   PRAPARE - Administrator, Civil Service (Medical): No    Lack of Transportation (Non-Medical): No  Physical Activity: Unknown (03/01/2023)   Exercise Vital Sign    Days of Exercise per Week: Patient declined    Minutes of Exercise per Session: 150+ min  Stress: No Stress Concern Present (03/01/2023)   [employer] of Occupational Health - Occupational Stress Questionnaire    Feeling of Stress : Not at all  Social Connections: Socially Integrated (03/01/2023)   Social Connection and Isolation Panel [NHANES]    Frequency of Communication with Friends  and Family: More than three times a week    Frequency of Social Gatherings with Friends and Family: More than three times a week    Attends Religious Services: More than 4 times per year    Active Member of [employer] or Organizations: Yes    Attends Engineer, structural: More than 4 times per year    Marital Status: Married     Family History: The patient's family history includes Arthritis in his paternal grandmother; Breast cancer in his mother; Dementia in his mother; Diabetes in his daughter and maternal uncle; Emphysema in his maternal grandfather; Heart disease in his father and mother; Hepatitis C in his brother; Hypertension in his father and mother; Hypothyroidism in his mother; Stroke in his maternal grandmother and mother. There is no history of Colon cancer, Esophageal cancer, Stomach cancer, or Rectal cancer.  ROS:   Please see the history of present illness.     All other systems reviewed and are negative.  EKGs/Labs/Other Studies Reviewed:    The following studies were reviewed today:   EKG:   06/20/2022: Normal sinus rhythm, rate 61, no ST abnormality 07/15/22: NSR, rate 59 12/20/22: Sinus bradycardia, rate 55, no ST abnormality 06/07/23: NSR, rate 61, no ST abnormalities  Recent Labs: 06/15/2022: TSH 1.15 01/05/2023: ALT  10 02/27/2023: BUN 26; Creatinine, Ser 1.15; Hemoglobin 14.0; Platelets 142; Potassium 4.4; Sodium 141  Recent Lipid Panel    Component Value Date/Time   CHOL 110 01/05/2023 0850   TRIG 84.0 01/05/2023 0850   TRIG 58 09/01/2006 0749   HDL 45.60 01/05/2023 0850   CHOLHDL 2 01/05/2023 0850   VLDL 16.8 01/05/2023 0850   LDLCALC 47 01/05/2023 0850    Physical Exam:    VS:  BP (!) 118/52   Pulse 61   Ht 5\' 9"  (1.753 m)   Wt 195 lb 3.2 oz (88.5 kg)   SpO2 96%   BMI 28.83 kg/m     Wt Readings from Last 3 Encounters:  06/07/23 195 lb 3.2 oz (88.5 kg)  05/10/23 197 lb 6.4 oz (89.5 kg)  03/07/23 195 lb 3.2 oz (88.5 kg)     GEN:  Well nourished, well developed in no acute distress HEENT: Normal NECK: No JVD; No carotid bruits CARDIAC: RRR, no murmurs, rubs, gallops RESPIRATORY:  Clear to auscultation without rales, wheezing or rhonchi  ABDOMEN: Soft, non-tender, non-distended MUSCULOSKELETAL:  No edema; No deformity  SKIN: Warm and dry NEUROLOGIC:  Alert and oriented x 3 PSYCHIATRIC:  Normal affect   ASSESSMENT:    1. Coronary artery disease of native artery of native heart with stable angina pectoris (HCC)   2. Essential hypertension   3. Hyperlipidemia, unspecified hyperlipidemia type   4. PAF (paroxysmal atrial fibrillation) (HCC)       PLAN:    CAD: Reported atypical chest pain.  Echocardiogram 05/02/2022 showed normal biventricular function, no significant valvular disease.  Coronary CTA on 07/11/2022 showed severe stenosis in mid to distal LAD (CT FFR 0.64), calcium score 610 (72nd percentile).  LHC 07/22/2022 showed heavily calcified 85% mid LAD stenosis; medical management recommended and consider PCI if worsening symptoms. -Continue Eliquis -Continue atorvastatin -Continue Imdur 30 mg daily.  Reports minimal recent angina.  If chest pain despite antianginals, plan for PCI -As needed sublingual nitroglycerin  Paroxysmal atrial fibrillation: He was diagnosed with  A-fib at ED visit 04/16/2022.  CHA2DS2-VASc score 3, was started on Eliquis 5 mg twice daily as well as diltiazem 30 mg as needed  for palpitations/tachycardia.  Echo 05/02/22 showed normal biventricular function, no significant valvular disease.  -Continue Eliquis 5 mg twice daily -Continue diltiazem 30 mg as needed.  No scheduled AV nodal blockers given low resting heart rate -Zio patch x 7 days 06/20/2022 showed no A-fib, 49 episodes of SVT with longest lasting 12 seconds with average rate 132 bpm.  Discussed Kardia mobile for long term Afib monitoring  Hypertension: On lisinopril 20 mg daily and Imdur 30 mg daily.  Appears controlled  Hyperlipidemia: On atorvastatin 20 mg daily.  LDL 47 on 01/05/2023  OSA: diagnosed in 09/2022, starting CPAP   RTC in 6 months    Medication Adjustments/Labs and Tests Ordered: Current medicines are reviewed at length with the patient today.  Concerns regarding medicines are outlined above.  Orders Placed This Encounter  Procedures   EKG 12-Lead   No orders of the defined types were placed in this encounter.   Patient Instructions  Medication Instructions:  Continue same medications *If you need a refill on your cardiac medications before your next appointment, please call your pharmacy*   Lab Work: None ordered   Testing/Procedures: None ordered   Follow-Up: At [facility], you and your health needs are our priority.  As part of our continuing mission to provide you with exceptional heart care, we have created designated Provider Care Teams.  These Care Teams include your primary Cardiologist (physician) and Advanced Practice Providers (APPs -  Physician Assistants and Nurse Practitioners) who all work together to provide you with the care you need, when you need it.  We recommend signing up for the patient portal called "MyChart".  Sign up information is provided on this After Visit Summary.  MyChart is used to connect with patients  for Virtual Visits (Telemedicine).  Patients are able to view lab/test results, encounter notes, upcoming appointments, etc.  Non-urgent messages can be sent to your provider as well.   To learn more about what you can do with MyChart, go to [email or web address].    Your next appointment:  6 months    Call in Sept to schedule Jan appointment     Provider:  Dr.Amillya Chavira     Signed, Little Ishikawa, MD  06/07/2023 8:36 AM    [facility]

## 2023-06-07 ENCOUNTER — Encounter: Payer: Self-pay | Admitting: Cardiology

## 2023-06-07 ENCOUNTER — Ambulatory Visit: Payer: PPO | Attending: Cardiology | Admitting: Cardiology

## 2023-06-07 VITALS — BP 118/52 | HR 61 | Ht 69.0 in | Wt 195.2 lb

## 2023-06-07 DIAGNOSIS — I10 Essential (primary) hypertension: Secondary | ICD-10-CM

## 2023-06-07 DIAGNOSIS — I480 Paroxysmal atrial fibrillation: Secondary | ICD-10-CM | POA: Diagnosis not present

## 2023-06-07 DIAGNOSIS — I25118 Atherosclerotic heart disease of native coronary artery with other forms of angina pectoris: Secondary | ICD-10-CM | POA: Diagnosis not present

## 2023-06-07 DIAGNOSIS — E785 Hyperlipidemia, unspecified: Secondary | ICD-10-CM

## 2023-06-07 NOTE — Patient Instructions (Signed)
Medication Instructions:  Continue same medications *If you need a refill on your cardiac medications before your next appointment, please call your pharmacy*   Lab Work: None ordered   Testing/Procedures: None ordered   Follow-Up: At [facility], you and your health needs are our priority.  As part of our continuing mission to provide you with exceptional heart care, we have created designated Provider Care Teams.  These Care Teams include your primary Cardiologist (physician) and Advanced Practice Providers (APPs -  Physician Assistants and Nurse Practitioners) who all work together to provide you with the care you need, when you need it.  We recommend signing up for the patient portal called "MyChart".  Sign up information is provided on this After Visit Summary.  MyChart is used to connect with patients for Virtual Visits (Telemedicine).  Patients are able to view lab/test results, encounter notes, upcoming appointments, etc.  Non-urgent messages can be sent to your provider as well.   To learn more about what you can do with MyChart, go to [email or web address].    Your next appointment:  6 months    Call in Sept to schedule Jan appointment    Provider:  Dr.Schumann

## 2023-06-08 DIAGNOSIS — G4733 Obstructive sleep apnea (adult) (pediatric): Secondary | ICD-10-CM | POA: Diagnosis not present

## 2023-06-09 DIAGNOSIS — H353211 Exudative age-related macular degeneration, right eye, with active choroidal neovascularization: Secondary | ICD-10-CM | POA: Diagnosis not present

## 2023-06-14 ENCOUNTER — Encounter (INDEPENDENT_AMBULATORY_CARE_PROVIDER_SITE_OTHER): Payer: Self-pay

## 2023-06-29 ENCOUNTER — Encounter (INDEPENDENT_AMBULATORY_CARE_PROVIDER_SITE_OTHER): Payer: Self-pay

## 2023-06-29 ENCOUNTER — Ambulatory Visit: Payer: PPO | Admitting: Family Medicine

## 2023-07-07 DIAGNOSIS — H353211 Exudative age-related macular degeneration, right eye, with active choroidal neovascularization: Secondary | ICD-10-CM | POA: Diagnosis not present

## 2023-07-09 DIAGNOSIS — G4733 Obstructive sleep apnea (adult) (pediatric): Secondary | ICD-10-CM | POA: Diagnosis not present

## 2023-07-20 ENCOUNTER — Ambulatory Visit (INDEPENDENT_AMBULATORY_CARE_PROVIDER_SITE_OTHER): Payer: PPO | Admitting: Family Medicine

## 2023-07-20 ENCOUNTER — Encounter: Payer: Self-pay | Admitting: Family Medicine

## 2023-07-20 VITALS — BP 116/66 | HR 55 | Temp 97.6°F | Ht 69.0 in | Wt 196.4 lb

## 2023-07-20 DIAGNOSIS — E785 Hyperlipidemia, unspecified: Secondary | ICD-10-CM

## 2023-07-20 DIAGNOSIS — I2510 Atherosclerotic heart disease of native coronary artery without angina pectoris: Secondary | ICD-10-CM

## 2023-07-20 DIAGNOSIS — I10 Essential (primary) hypertension: Secondary | ICD-10-CM

## 2023-07-20 NOTE — Patient Instructions (Addendum)
Let us know when you get your flu/COVID vaccine this fall.  No labs today since stable last check and we will do again at physical  Recommended follow up: Return in about 6 months (around 01/17/2024) for physical or sooner if needed.Schedule b4 you leave.

## 2023-07-20 NOTE — Progress Notes (Signed)
Phone [phone] In person visit   Subjective:   Edward Goodwin is a 72 y.o. year old very pleasant male patient who presents for/with See problem oriented charting Chief Complaint  Patient presents with   Medical Management of Chronic Issues   Hypertension   Hyperlipidemia    Past Medical History-  Patient Active Problem List   Diagnosis Date Noted   Paroxysmal atrial fibrillation (HCC) 04/21/2022    Priority: High   Secondary hypercoagulable state (HCC) 04/21/2022    Priority: High   Coronary artery disease 12/16/2021    Priority: High   Abdominal aortic aneurysm (AAA) without rupture, unspecified part (HCC)     Priority: High   Ulcerative colitis (HCC) 08/20/2007    Priority: High   Former smoker 04/19/2018    Priority: Medium    Hyperlipidemia 04/19/2018    Priority: Medium    Vertigo 12/19/2017    Priority: Medium    Gout 03/24/2014    Priority: Medium    Elevated PSA, less than 10 ng/ml 09/09/2013    Priority: Medium    Macular degeneration, bilateral 07/15/2012    Priority: Medium    Obesity (BMI 30.0-34.9) 12/02/2008    Priority: Medium    Essential hypertension 08/20/2007    Priority: Medium    PSORIASIS 08/20/2007    Priority: Medium    Onychomycosis 12/19/2017    Priority: Low   Trochanteric bursitis of left hip 03/31/2015    Priority: Low    Medications- reviewed and updated Current Outpatient Medications  Medication Sig Dispense Refill   acetaminophen (TYLENOL) 650 MG CR tablet Take 650 mg by mouth every 8 (eight) hours as needed for pain.     Ascorbic Acid (VITAMIN C PO) Take 1 tablet by mouth daily. 1000 mg     atorvastatin (LIPITOR) 20 MG tablet TAKE 1 TABLET BY MOUTH EVERY DAY 90 tablet 3   clobetasol ointment (TEMOVATE) 0.05 % Apply 1 Application topically 2 (two) times daily as needed (irritation).     diltiazem (CARDIZEM) 30 MG tablet Take one tablet by mouth every 4 hrs as needed, Heart rate needs to be above 100, top number blood  pressure above 100 45 tablet 1   ELIQUIS 5 MG TABS tablet TAKE 1 TABLET BY MOUTH TWICE A DAY 60 tablet 5   faricimab-svoa (VABYSMO) 6 MG/0.05ML SOLN intravitreal injection 6 mg by Intravitreal route once.     isosorbide mononitrate (IMDUR) 30 MG 24 hr tablet Take 1 tablet (30 mg total) by mouth daily. 90 tablet 3   L-Lysine 1000 MG TABS Take 1,000 mg by mouth daily.     lisinopril (ZESTRIL) 20 MG tablet TAKE 1 TABLET BY MOUTH EVERY DAY 90 tablet 1   Multiple Vitamins-Minerals (PRESERVISION AREDS 2) CAPS Take 1 capsule by mouth 2 (two) times daily before lunch and supper.     Propylene Glycol (SYSTANE COMPLETE OP) Place 1 drop into both eyes 2 (two) times daily.     diphenhydramine-acetaminophen (TYLENOL PM) 25-500 MG TABS tablet Take 1 tablet by mouth at bedtime as needed (pain/sleep). (Patient not taking: Reported on 07/20/2023)     nitroGLYCERIN (NITROSTAT) 0.4 MG SL tablet Place 1 tablet (0.4 mg total) under the tongue every 5 (five) minutes as needed. 25 tablet 3   No current facility-administered medications for this visit.     Objective:  BP 116/66 Comment: most recent home reading from this am  Pulse (!) 55   Temp 97.6 F (36.4 C)   Ht 5'  9" (1.753 m)   Wt 196 lb 6.4 oz (89.1 kg)   SpO2 98%   BMI 29.00 kg/m  Gen: NAD, resting comfortably CV: RRR no murmurs rubs or gallops Lungs: CTAB no crackles, wheeze, rhonchi Ext: no edema Skin: warm, dry Neuro: grossly normal, moves all extremities     Assessment and Plan   #OSA- started June 08 2023 but struggles with it. Sometimes takes it off within an hour or two as did not feel comfortable. Gets irritation on face with seal -still feeling tired when waking up  # Atrial fibrillation #Secondary hypercoagulable state S: Rate controlled with diltiazem 30 mg only as needed for elevated heart rate/palpitations/heart racing- states has not had to use- has never used Anticoagulated with Eliquis 5 mg twice daily  A/P:  appropriately  anticoagulated and rate controlled (regular today- discussed cannot prove not in a fib but not obviously in a fib)- continue current medicine  #hypertension S: medication: Lisinopril 20 mg once daily, imdur 30 mg -289 max weight at prior practice- has done an incredible job maintaining weight loss Home readings #s: excellent home readings- brings sheet BP Readings from Last 3 Encounters:  07/20/23 116/66  06/07/23 (!) 118/52  05/10/23 124/84  A/P: stable- continue current medicines   #CAD- LHC 07/22/2022 showed heavily calcified 85% mid LAD stenosis; medical management recommended and consider PCI worsening symptoms.  #hyperlipidemia  S: Medication:Atorvastatin 20 mg once daily, imdur 30 mg- still occasionally needs nitroglycerin and helpful, eliquis Lab Results  Component Value Date   CHOL 110 01/05/2023   HDL 45.60 01/05/2023   LDLCALC 47 01/05/2023   TRIG 84.0 01/05/2023   CHOLHDL 2 01/05/2023  A/P: coronary artery disease with stable angina- continue current medications  Lipids at goal- continue current medications with LDL under 70  #Gout- uric acid has been at goal under 6- no flares in years- rations his bacon and does well  #Vertigo/BPPV-sparing issues - still has meclizine 25 mg on hand if needed - no recent issues  #macular degeneration- close follow up with optho- 2024 ongoing injections  Recommended follow up: Return in about 6 months (around 01/17/2024) for physical or sooner if needed.Schedule b4 you leave. Future Appointments  Date Time Provider Department Center  09/06/2023  3:00 PM Lennette Bihari, MD [facility] None  09/27/2023  2:20 PM Lennette Bihari, MD [facility] None  02/05/2024  1:00 PM [facility] ANNUAL WELLNESS VISIT 1 [facility]   Lab/Order associations:   ICD-10-CM   1. Essential hypertension  I10     2. Hyperlipidemia, unspecified hyperlipidemia type  E78.5     3. Coronary artery disease involving native coronary artery of native heart without  angina pectoris  I25.10       No orders of the defined types were placed in this encounter.   Return precautions advised.  Tana Conch, MD

## 2023-08-07 DIAGNOSIS — G4733 Obstructive sleep apnea (adult) (pediatric): Secondary | ICD-10-CM | POA: Diagnosis not present

## 2023-08-08 DIAGNOSIS — H353211 Exudative age-related macular degeneration, right eye, with active choroidal neovascularization: Secondary | ICD-10-CM | POA: Diagnosis not present

## 2023-08-09 DIAGNOSIS — G4733 Obstructive sleep apnea (adult) (pediatric): Secondary | ICD-10-CM | POA: Diagnosis not present

## 2023-08-09 DIAGNOSIS — Z7901 Long term (current) use of anticoagulants: Secondary | ICD-10-CM | POA: Diagnosis not present

## 2023-08-09 DIAGNOSIS — J342 Deviated nasal septum: Secondary | ICD-10-CM | POA: Diagnosis not present

## 2023-08-16 ENCOUNTER — Ambulatory Visit (INDEPENDENT_AMBULATORY_CARE_PROVIDER_SITE_OTHER): Payer: PPO

## 2023-08-16 DIAGNOSIS — Z23 Encounter for immunization: Secondary | ICD-10-CM

## 2023-08-26 ENCOUNTER — Other Ambulatory Visit: Payer: Self-pay | Admitting: Cardiology

## 2023-08-26 DIAGNOSIS — I480 Paroxysmal atrial fibrillation: Secondary | ICD-10-CM

## 2023-08-28 NOTE — Telephone Encounter (Signed)
Prescription refill request for Eliquis received. Indication:afib Last office visit:7/24 Scr:1.15  4/24 Age: 72 Weight:89.1  kg  Prescription refilled

## 2023-09-05 DIAGNOSIS — H43813 Vitreous degeneration, bilateral: Secondary | ICD-10-CM | POA: Diagnosis not present

## 2023-09-05 DIAGNOSIS — H353211 Exudative age-related macular degeneration, right eye, with active choroidal neovascularization: Secondary | ICD-10-CM | POA: Diagnosis not present

## 2023-09-05 DIAGNOSIS — H35033 Hypertensive retinopathy, bilateral: Secondary | ICD-10-CM | POA: Diagnosis not present

## 2023-09-05 DIAGNOSIS — H353122 Nonexudative age-related macular degeneration, left eye, intermediate dry stage: Secondary | ICD-10-CM | POA: Diagnosis not present

## 2023-09-06 ENCOUNTER — Ambulatory Visit: Payer: PPO | Attending: Cardiovascular Disease | Admitting: Cardiovascular Disease

## 2023-09-06 ENCOUNTER — Encounter: Payer: Self-pay | Admitting: Cardiovascular Disease

## 2023-09-06 DIAGNOSIS — G4733 Obstructive sleep apnea (adult) (pediatric): Secondary | ICD-10-CM

## 2023-09-06 DIAGNOSIS — I480 Paroxysmal atrial fibrillation: Secondary | ICD-10-CM

## 2023-09-06 DIAGNOSIS — I25118 Atherosclerotic heart disease of native coronary artery with other forms of angina pectoris: Secondary | ICD-10-CM

## 2023-09-06 DIAGNOSIS — E785 Hyperlipidemia, unspecified: Secondary | ICD-10-CM

## 2023-09-06 DIAGNOSIS — G4736 Sleep related hypoventilation in conditions classified elsewhere: Secondary | ICD-10-CM

## 2023-09-06 DIAGNOSIS — J342 Deviated nasal septum: Secondary | ICD-10-CM

## 2023-09-06 DIAGNOSIS — D6869 Other thrombophilia: Secondary | ICD-10-CM

## 2023-09-06 NOTE — Progress Notes (Signed)
Cardiology Office Note    Date:  09/06/2023   ID:  Edward Goodwin, Edward Goodwin [birth year], MRN [id number]  PCP:  Shelva Majestic, MD  Cardiologist:  Nicki Guadalajara, MD (sleep); Dr. Epifanio Lesches  4 month F/U evaluation initially referred for sleep study by Alphonzo Severance, PA .   History of Present Illness:  Edward Goodwin is a 72 y.o. male who is followed by Dr. Tana Conch for primary care.  He has a history of mild hearing loss with hearing aids, and has issues with a deviated nasal septum.  He has a history of CAD with a heavily calcified LAD stenosis on medical management, hypertension, psoriasis, ulcerative colitis and paroxysmal atrial fibrillation.  He was diagnosed with atrial fibrillation at an ER visit on April 16, 2022.  His CHA2DS2-VASc score was 3 and he was started on Eliquis 5 mg twice a day in addition to as needed diltiazem.  He was evaluated by Alphonzo Severance PA in atrial fibrillation clinic and was referred to Dr. Epifanio Lesches for cardiology care.  Due to concerns for obstructive sleep apnea he was referred for a diagnostic polysomnogram which was done on October 11, 2022.  On his home study, his AHI was 9.6/h with RDI 10.6/h consistent with mild overall sleep apnea.  However, during REM sleep, sleep apnea/severe with an AHI of 31.5 and with supine sleep was even more severe with an AHI of 60.6/h.  He dropped his oxygen saturation to a nadir of 79% and he was noted to have loud snoring throughout the study.  He was referred for a CPAP titration trial on December 22, 2022.  The study was aborted midway through due to difficulty with claustrophobia with a fullface mask and he felt he was not getting adequate oxygenation.  He was titrated only to 7 cm of water.  Apparently he has significant issues with deviated septum and felt claustrophobic with his mask on.  He apparently had only tried a fullface mask over the nose.  It was recommended that he initiate CPAP auto  therapy with an EPR of 3 at 7 to 14 cm of water with heated humidification.  He had been contacted by [employer] to contact them to obtain a new machine.  However, he did not want to get a machine until he had an opportunity to see me in sleep clinic for evaluation.  I saw him for my initial sleep evaluation on January 06, 2023.  He typically goes to bed between 8 and 10 PM and wakes up at 6 AM.  States his sleep is not very good.  He has had issues with significant right deviated septum.  He has not been evaluated by an ENT doctor.  Presently, he continues to be on Eliquis for anticoagulation.  He is on isosorbide 15 mg and lisinopril 10 mg daily in addition to as needed diltiazem.  He is on atorvastatin 20 mg for hyperlipidemia.  During his initial consultation and evaluation I had an extensive discussion with him regarding sleep apnea and potential adverse cardiovascular consequences.  During that evaluation he was hesitant to initiate CPAP therapy due to claustrophobia and breathing difficulties.  I recommended ENT evaluation and referred him to Dr. Jenne Goodwin for possible need for repair of his deviated septum and turbinate reduction.  On Mar 22, 2023, he was evaluated by Dr. Jenne Goodwin of ENT.  He felt his nasal septum was deviated significantly to the right resulting in narrowing of  the nasal passage.  There was also some degree of nasal valve collapse.  Discussion was made concerning possible septoplasty.  However before considering elective surgery it was recommended by Dr. Jenne Goodwin that he initiate a trial of CPAP therapy.  If he cannot tolerate treatment, risk was felt to be mitigated for surgery.  If he cannot, he may need sleep endoscopy and consideration of alternative means of treatment.  Edward Goodwin presents to the office today with his wife for further evaluation on May 10, 2023.   An order has been sent to [facility] this week for him to receive his CPAP device.  I had initially recommended CPAP auto therapy with an  EPR of 3 at 7 to 14 cm of water.  He has history of snoring, nonrestorative sleep, frequent nocturnal urination, and difficulty sleeping on his back.    Provide is received a ResMed air sense 10 AutoSet unit on June 08, 2023 with [employer] [facility] as his DME company.  He apparently has subsequently seen Dr. Jearld Fenton explained the procedure for his deviated septum.  At present, Edward Goodwin does not feel he wants to pursue this presently.  He has been using CPAP since his update.  His initial compliance data from July 25 through July 07, 2023 confirms that he is meeting compliance standards.  Usage days was 100% and usage greater than 4 hours was 100% with average CPAP use at 6 hours and 55 minutes.  His CPAP pressure is set at a range of 7 to 14 cm.  AHI on that evaluation was one 5.4.  More recent download, temper 22 through September 04, 2023 AHI was 4.8 with 0.5 central apnea index and 1.3 obstructive.  He is turned off his ramp since he was starting at a pressure of 4 which was too low.  His most recent download shows his 95th percentile pressure 12.3 with maximal average pressure 12.9.  He is sleeping better with CPAP.  He has had some issues with his fullface mask which he needs due to his significant deviated septum.  He has had issues with mask leak.  He presents for evaluation.  Past Medical History:  Diagnosis Date   Chicken pox    Colitis, ulcerative (HCC)    Hypertension    Macular degeneration, bilateral 07/2012   areds 2    Psoriasis     Past Surgical History:  Procedure Laterality Date   ACNE CYST REMOVAL     CATARACT EXTRACTION W/ INTRAOCULAR LENS IMPLANT  10/2014   bilat   COLONOSCOPY     FOOT SURGERY  1988   LEFT HEART CATH AND CORONARY ANGIOGRAPHY N/A 07/22/2022   Procedure: LEFT HEART CATH AND CORONARY ANGIOGRAPHY;  Surgeon: Lyn Records, MD;  Location: [facility];  Service: Cardiovascular;  Laterality: N/A;   SHOULDER ARTHROSCOPY  2007   Right   TONSILLECTOMY      Current  Medications: Outpatient Medications Prior to Visit  Medication Sig Dispense Refill   acetaminophen (TYLENOL) 650 MG CR tablet Take 650 mg by mouth every 8 (eight) hours as needed for pain.     Ascorbic Acid (VITAMIN C PO) Take 1 tablet by mouth daily. 1000 mg     atorvastatin (LIPITOR) 20 MG tablet TAKE 1 TABLET BY MOUTH EVERY DAY 90 tablet 3   clobetasol ointment (TEMOVATE) 0.05 % Apply 1 Application topically 2 (two) times daily as needed (irritation).     diltiazem (CARDIZEM) 30 MG tablet Take one tablet by mouth  every 4 hrs as needed, Heart rate needs to be above 100, top number blood pressure above 100 45 tablet 1   diphenhydramine-acetaminophen (TYLENOL PM) 25-500 MG TABS tablet Take 1 tablet by mouth at bedtime as needed (pain/sleep).     ELIQUIS 5 MG TABS tablet TAKE 1 TABLET BY MOUTH TWICE A DAY 60 tablet 5   faricimab-svoa (VABYSMO) 6 MG/0.05ML SOLN intravitreal injection 6 mg by Intravitreal route once.     isosorbide mononitrate (IMDUR) 30 MG 24 hr tablet Take 1 tablet (30 mg total) by mouth daily. 90 tablet 3   L-Lysine 1000 MG TABS Take 1,000 mg by mouth daily.     lisinopril (ZESTRIL) 20 MG tablet TAKE 1 TABLET BY MOUTH EVERY DAY 90 tablet 1   Multiple Vitamins-Minerals (PRESERVISION AREDS 2) CAPS Take 1 capsule by mouth 2 (two) times daily before lunch and supper.     Propylene Glycol (SYSTANE COMPLETE OP) Place 1 drop into both eyes 2 (two) times daily.     nitroGLYCERIN (NITROSTAT) 0.4 MG SL tablet Place 1 tablet (0.4 mg total) under the tongue every 5 (five) minutes as needed. 25 tablet 3   No facility-administered medications prior to visit.     Allergies:   Sulfonamide derivatives   Social History   Socioeconomic History   Marital status: Married    Spouse name: Not on file   Number of children: 1   Years of education: 12   Highest education level: Associate degree: occupational, Scientist, product/process development, or vocational program  Occupational History   Occupation: Systems developer     Comment: retired   Tobacco Use   Smoking status: Former    Current packs/day: 0.00    Types: Cigarettes    Quit date: 10/23/1989    Years since quitting: 33.8   Smokeless tobacco: Never  Vaping Use   Vaping status: Never Used  Substance and Sexual Activity   Alcohol use: Not Currently    Comment: rare glass of wine - less than once a year   Drug use: No   Sexual activity: Not Currently  Other Topics Concern   Not on file  Social History Narrative   Married 1971. 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 [employer] 6 years.       ACP: HCPOA - wife. Yes for acute CPR, no for prolonged mechanical ventilatory support, no for prolonged artificial nutrition or other heroic measures leaving him in an incapacitated state.    Social Determinants of Health   Financial Resource Strain: Low Risk  (03/01/2023)   Overall Financial Resource Strain (CARDIA)    Difficulty of Paying Living Expenses: Not hard at all  Food Insecurity: No Food Insecurity (03/01/2023)   Hunger Vital Sign    Worried About Running Out of Food in the Last Year: Never true    Ran Out of Food in the Last Year: Never true  Transportation Needs: No Transportation Needs (03/01/2023)   PRAPARE - Administrator, Civil Service (Medical): No    Lack of Transportation (Non-Medical): No  Physical Activity: Unknown (03/01/2023)   Exercise Vital Sign    Days of Exercise per Week: Patient declined    Minutes of Exercise per Session: 150+ min  Stress: No Stress Concern Present (03/01/2023)   [employer] of Occupational Health - Occupational Stress Questionnaire    Feeling of  Stress : Not at all  Social Connections: Socially Integrated (03/01/2023)   Social Connection and Isolation Panel [NHANES]    Frequency of Communication with Friends and Family: More than three times a week    Frequency of  Social Gatherings with Friends and Family: More than three times a week    Attends Religious Services: More than 4 times per year    Active Member of [employer] or Organizations: Yes    Attends Engineer, structural: More than 4 times per year    Marital Status: Married     Family History:  The patient's family history includes Arthritis in his paternal grandmother; Breast cancer in his mother; Dementia in his mother; Diabetes in his daughter and maternal uncle; Emphysema in his maternal grandfather; Heart disease in his father and mother; Hepatitis C in his brother; Hypertension in his father and mother; Hypothyroidism in his mother; Stroke in his maternal grandmother and mother.   ROS General: Negative; No fevers, chills, or night sweats;  HEENT: Negative; No changes in vision or hearing, sinus congestion, difficulty swallowing Pulmonary: Negative; No cough, wheezing, shortness of breath, hemoptysis Cardiovascular: Negative; No chest pain, presyncope, syncope, palpitations GI: Negative; No nausea, vomiting, diarrhea, or abdominal pain GU: Negative; No dysuria, hematuria, or difficulty voiding Musculoskeletal: Negative; no myalgias, joint pain, or weakness Hematologic/Oncology: Negative; no easy bruising, bleeding Endocrine: Negative; no heat/cold intolerance; no diabetes Neuro: Negative; no changes in balance, headaches Skin: Negative; No rashes or skin lesions Psychiatric: Negative; No behavioral problems, depression Sleep: See HPI: Positive for mild overall sleep apnea which is severe during REM sleep in the supine position.  Positive for snoring, an Epworth Sleepiness Scale score was calculated in the office today and this endorsed at 3 arguing against residual daytime sleepiness.   No bruxism, restless legs, hypnogognic hallucinations, no cataplexy Other comprehensive 14 point system review is negative.   PHYSICAL EXAM:   VS:  BP 130/66 (BP Location: Left Arm, Patient Position:  Sitting, Cuff Size: Normal)   Pulse 60   Ht 5\' 9"  (1.753 m)   Wt 201 lb 9.6 oz (91.4 kg)   SpO2 99%   BMI 29.77 kg/m     Repeat blood pressure by me was 120/70  Wt Readings from Last 3 Encounters:  09/06/23 201 lb 9.6 oz (91.4 kg)  07/20/23 196 lb 6.4 oz (89.1 kg)  06/07/23 195 lb 3.2 oz (88.5 kg)    General: Alert, oriented, no distress.  Skin: normal turgor, no rashes, warm and dry HEENT: Normocephalic, atraumatic. Pupils equal round and reactive to light; sclera anicteric; extraocular muscles intact;  Nose: Deviated nasal septum significantly to the right with narrowing of nasal passage Mouth/Parynx benign; Mallinpatti scale 3 Neck: No JVD, no carotid bruits; normal carotid upstroke Lungs: clear to ausculatation and percussion; no wheezing or rales Chest wall: without tenderness to palpitation Heart: PMI not displaced, RRR, s1 s2 normal, 1/6 systolic murmur, no diastolic murmur, no rubs, gallops, thrills, or heaves Abdomen: soft, nontender; no hepatosplenomehaly, BS+; abdominal aorta nontender and not dilated by palpation. Back: no CVA tenderness Pulses 2+ Musculoskeletal: full range of motion, normal strength, no joint deformities Extremities: no clubbing cyanosis or edema, Homan's sign negative  Neurologic: grossly nonfocal; Cranial nerves grossly wnl Psychologic: Normal mood and affect    Studies/Labs Reviewed:   EKG Interpretation Date/Time:  Wednesday September 06 2023 15:12:29 EDT Ventricular Rate:  60 PR Interval:  182 QRS Duration:  86 QT Interval:  396 QTC Calculation: 396 R  Axis:   85  Text Interpretation: Normal sinus rhythm Low voltage QRS When compared with ECG of 07-Jun-2023 08:01, No significant change was found Confirmed by Nicki Guadalajara ([id number]) on 09/06/2023 6:21:43 PM    January 06, 2023 ECG (independently read by me): NSR at 63, no ectopy  Recent Labs:    Latest Ref Rng & Units 02/27/2023    2:58 PM 01/05/2023    8:50 AM 07/15/2022    9:12 AM   BMP  Glucose 70 - 99 mg/dL 93  89  78   BUN 8 - 23 mg/dL 26  16  18    Creatinine 0.61 - 1.24 mg/dL 6.21  3.08  6.57   BUN/Creat Ratio 10 - 24   18   Sodium 135 - 145 mmol/L 141  142  139   Potassium 3.5 - 5.1 mmol/L 4.4  4.1  4.6   Chloride 98 - 111 mmol/L 106  106  102   CO2 22 - 32 mmol/L 24  30  25    Calcium 8.9 - 10.3 mg/dL 9.3  9.3  9.4         Latest Ref Rng & Units 01/05/2023    8:50 AM 04/16/2022    8:07 AM 12/16/2021   10:31 AM  Hepatic Function  Total Protein 6.0 - 8.3 g/dL 6.4  6.8  6.5   Albumin 3.5 - 5.2 g/dL 4.6  4.3  4.5   AST 0 - 37 U/L 13  12  13    ALT 0 - 53 U/L 10  9  8    Alk Phosphatase 39 - 117 U/L 69  55  66   Total Bilirubin 0.2 - 1.2 mg/dL 1.1  0.9  1.0        Latest Ref Rng & Units 02/27/2023    2:58 PM 01/05/2023    8:50 AM 07/15/2022    9:12 AM  CBC  WBC 4.0 - 10.5 K/uL 6.3  4.7  4.0   Hemoglobin 13.0 - 17.0 g/dL 84.6  96.2  95.2   Hematocrit 39.0 - 52.0 % 40.7  42.0  42.3   Platelets 150 - 400 K/uL 142  137.0  144    Lab Results  Component Value Date   MCV 88.7 02/27/2023   MCV 90.4 01/05/2023   MCV 86 07/15/2022   Lab Results  Component Value Date   TSH 1.15 06/15/2022   Lab Results  Component Value Date   HGBA1C 5.0 09/05/2013     BNP    Component Value Date/Time   BNP 402.8 (H) 04/16/2022 0807    ProBNP No results found for: "PROBNP"   Lipid Panel     Component Value Date/Time   CHOL 110 01/05/2023 0850   TRIG 84.0 01/05/2023 0850   TRIG 58 09/01/2006 0749   HDL 45.60 01/05/2023 0850   CHOLHDL 2 01/05/2023 0850   VLDL 16.8 01/05/2023 0850   LDLCALC 47 01/05/2023 0850     RADIOLOGY: No results found.   Additional studies/ records that were reviewed today include:    Patient Name: Mikah, Fronheiser Date: 10/11/2022 Gender: Male D.O.B: [birth year] Age (years): 17 Referring Provider: Alphonzo Severance PA Height (inches): 69 Interpreting Physician: Nicki Guadalajara MD, ABSM Weight (lbs): 195 RPSGT: Rosette Reveal BMI: 29 MRN: [id number] Neck Size: 16.00   CLINICAL INFORMATION Sleep Study Type: NPSG   Indication for sleep study: Hypertension, CAD, PAF, snoring   Epworth Sleepiness Score: 3   SLEEP STUDY TECHNIQUE As per the AASM Manual  for the Scoring of Sleep and Associated Events v2.3 (April 2016) with a hypopnea requiring 4% desaturations.   The channels recorded and monitored were frontal, central and occipital EEG, electrooculogram (EOG), submentalis EMG (chin), nasal and oral airflow, thoracic and abdominal wall motion, anterior tibialis EMG, snore microphone, electrocardiogram, and pulse oximetry.   MEDICATIONS acetaminophen (TYLENOL) 650 MG CR tablet apixaban (ELIQUIS) 5 MG TABS tablet Ascorbic Acid (VITAMIN C PO) aspirin EC 81 MG tablet atorvastatin (LIPITOR) 20 MG tablet clobetasol ointment (TEMOVATE) 0.05 % diltiazem (CARDIZEM) 30 MG tablet diphenhydramine-acetaminophen (TYLENOL PM) 25-500 MG TABS tablet faricimab-svoa (VABYSMO) 6 MG/0.05ML SOLN intravitreal injection isosorbide mononitrate (IMDUR) 30 MG 24 hr tablet L-Lysine 1000 MG TABS lisinopril (ZESTRIL) 20 MG tablet Multiple Vitamins-Minerals (PRESERVISION AREDS 2) CAPS nitroGLYCERIN (NITROSTAT) 0.4 MG SL tablet (Expired) Propylene Glycol (SYSTANE COMPLETE OP) Medications self-administered by patient taken the night of the study : TYLENOL PM   SLEEP ARCHITECTURE The study was initiated at 9:53:22 PM and ended at 4:21:24 AM.   Sleep onset time was 7.8 minutes and the sleep efficiency was 82.0%. The total sleep time was 318.2 minutes.   Stage REM latency was 60.0 minutes.   The patient spent 5.7% of the night in stage N1 sleep, 66.8% in stage N2 sleep, 0.0% in stage N3 and 27.5% in REM.   Alpha intrusion was absent.   Supine sleep was 14.93%.   RESPIRATORY PARAMETERS The overall apnea/hypopnea index (AHI) was 9.6 per hour. The respiratory disturbance index (RDI) was 10.6/h. There were 24 total apneas,  including 23 obstructive, 1 central and 0 mixed apneas. There were 27 hypopneas and 5 RERAs.   The AHI during Stage REM sleep was 31.5 per hour.   AHI while supine was 60.6 per hour.   The mean oxygen saturation was 93.3%. The minimum SpO2 during sleep was 79.0%.   Loud snoring was noted during this study.   CARDIAC DATA The 2 lead EKG demonstrated sinus rhythm. The mean heart rate was 50.8 beats per minute. Other EKG findings include: None.   LEG MOVEMENT DATA The total PLMS were 0 with a resulting PLMS index of 0.0. Associated arousal with leg movement index was 0.8 .   IMPRESSIONS - Mild obstructive sleep apnea overall (AHI 9.6/h; RDI 10.6/h): however, sleep apnea was severe during REM sleep (AHI 31.5/h) and very severe with supine sleep (AHI 60.6/h). - No significant central sleep apnea occurred during this study (CAI 0.2/h). - Significant oxygen desaturation to a nadir of 79.0%. - The patient snored with loud snoring volume. - No cardiac abnormalities were noted during this study. - Clinically significant periodic limb movements did not occur during sleep. No significant associated arousals. - DIAGNOSIS - Obstructive Sleep Apnea (G47.33) - Nocturnal Hypoxemia (G47.36)   RECOMMENDATIONS - Therapeutic CPAP titration to determine optimal pressure required to alleviate sleep disordered breathing. In this patient with cardiovascular comorbidities recommend an in-lab CPAP titration; if unable, then initiate AUto PAP with EPR of 3 at 6 - 18 cm of water. - Effort should be made to otpimize nasal and oropharyngeal patency. - Positional therapy avoiding supine position during sleep. - Avoid alcohol, sedatives and other CNS depressants that may worsen sleep apnea and disrupt normal sleep architecture. - Sleep hygiene should be reviewed to assess factors that may improve sleep quality. - Weight management and regular exercise should be initiated or continued if appropriate. - Recommend a  download and sleep clinic evaluation after 4 weeks of therapy.    CPAP TITRATION: 12/22/2022  CLINICAL INFORMATION The patient is referred for a CPAP titration to treat sleep apnea.   Date of NPSG: 10/11/2022:  AHI 9.6/h; RDI 10.6/h; REM AHI 31.5/h; supine AHI 60.6/h; O2 nadir 79%.   SLEEP STUDY TECHNIQUE As per the AASM Manual for the Scoring of Sleep and Associated Events v2.3 (April 2016) with a hypopnea requiring 4% desaturations.   The channels recorded and monitored were frontal, central and occipital EEG, electrooculogram (EOG), submentalis EMG (chin), nasal and oral airflow, thoracic and abdominal wall motion, anterior tibialis EMG, snore microphone, electrocardiogram, and pulse oximetry. Continuous positive airway pressure (CPAP) was initiated at the beginning of the study and titrated to treat sleep-disordered breathing.   MEDICATIONS acetaminophen (TYLENOL) 650 MG CR tablet aflibercept (EYLEA HD) 8 MG/0.07ML SOLN apixaban (ELIQUIS) 5 MG TABS tablet Ascorbic Acid (VITAMIN C PO) atorvastatin (LIPITOR) 20 MG tablet clobetasol ointment (TEMOVATE) 0.05  diltiazem (CARDIZEM) 30 MG tablet diphenhydramine-acetaminophen (TYLENOL PM) 25-500 MG TABS tablet faricimab-svoa (VABYSMO) 6 MG/0.05ML SOLN intravitreal injection isosorbide mononitrate (IMDUR) 30 MG 24 hr tablet L-Lysine 1000 MG TAB lisinopril (ZESTRIL) 10 MG tablet Multiple Vitamins-Minerals (PRESERVISION AREDS 2) CAP nitroGLYCERIN (NITROSTAT) 0.4 MG SL tablet Propylene Glycol (SYSTANE COMPLETE OP) Medications self-administered by patient taken the night of the study : TYLENOL PM   TECHNICIAN COMMENTS Comments added by technician: Patient had difficulty initiating sleep. Patient could not tolerate CPAP. Study was inconclusive Comments added by scorer: N/A   RESPIRATORY PARAMETERS Optimal PAP Pressure (cm):  7          AHI at Optimal Pressure (/hr):            2.1 Overall Minimal O2 (%):         85.0     Supine % at  Optimal Pressure (%):    18 Minimal O2 at Optimal Pressure (%): 85.0        SLEEP ARCHITECTURE The study was initiated at 10:58:18 PM and ended at 3:29:08 AM.   Sleep onset time was 0.5 minutes and the sleep efficiency was 83.3%. The total sleep time was 225.5 minutes.   The patient spent 1.8% of the night in stage N1 sleep, 91.4% in stage N2 sleep, 0.0% in stage N3 and 6.9% in REM.Stage REM latency was 150.0 minutes   Wake after sleep onset was 44.8. Alpha intrusion was absent. Supine sleep was 10.28%.   CARDIAC DATA The 2 lead EKG demonstrated sinus rhythm. The mean heart rate was 95.1 beats per minute. Other EKG findings include: None.   LEG MOVEMENT DATA The total Periodic Limb Movements of Sleep (PLMS) were 0. The PLMS index was 0.0. A PLMS index of <15 is considered normal in adults.   IMPRESSIONS - CPAP was initiated at 5 cm and was titrated to only 7 cm of water. (AHI 2.1/h; O2 nadir 85%) - Moderate oxygen desaturations were observed during this titration to a nadir of 85.0% at 7 cm. - No snoring was audible during this study. - No cardiac abnormalities were observed during this study. - Clinically significant periodic limb movements were not noted during this study. Arousals associated with PLMs were rare.   DIAGNOSIS - Obstructive Sleep Apnea (G47.33)   RECOMMENDATIONS - Recommend an initial trial of CPAP Auto therapy with EPR of 3 at 7 - 14 cm H2O with heated humidification. A large size Fisher&Paykel Full Face Simplus mask was used for the titration. - Effort should be made to optimize nasal and orophayngeal patency. - Avoid alcohol, sedatives and other CNS depressants  that may worsen sleep apnea and disrupt normal sleep architecture. - Sleep hygiene should be reviewed to assess factors that may improve sleep quality. - Weight management and regular exercise should be initiated or continued. - Recommend a download and sleep clinic evaluation after 4 weeks of therapy.     ASSESSMENT:    1. OSA (obstructive sleep apnea)   2. Deviated septum   3. Coronary artery disease of native artery of native heart with stable angina pectoris (HCC)   4. PAF (paroxysmal atrial fibrillation) (HCC)   5. Hyperlipidemia LDL goal <70   6. Hypoxemia associated with sleep   7. Secondary hypercoagulable state [facility])     PLAN:  Edward Goodwin is a 72 year old gentleman who has a history of CAD, hypertension, and recently developed an episode of atrial fibrillation leading to an ER  on April 16, 2022.  In addition he has a history of ulcerative colitis, psoriasis, and has been documented to have significant deviated septum.  Following his episode of atrial fibrillation, he was referred for a sleep study by Alphonzo Severance, PA in the atrial fibrillation clinic.   At his initial office visit with me I had an extensive discussion with him and reviewed both his home study as well as CPAP titration.  I extensively reviewed potential adverse cardiovascular consequences of untreated sleep apnea.  He was subsequently referred to Dr. Jenne Goodwin for ENT evaluation who concurred that he had severe deviation of his nasal septum rightward with nasal passage narrowing.  It was recommended by Dr. Jenne Goodwin that he initiate a trial of CPAP therapy before undergoing elective surgery.  I again reviewed his sleep data.  We have placed an order with [employer] who is his DME company for him to receive a ResMed AirSense 11 CPAP auto unit.  He will be set initially an EPR of 3, from pressure range of 7 to 14 cm of water.  At his June 2024 evaluation I again discussed with him the adverse potential cardiovascular consequences of untreated sleep apnea and in particular its effect on blood pressure control, increased risk for nocturnal arrhythmias with increased risk for atrial fibrillation.  I discussed its negative effect on insulin resistance with increased glucose, increased inflammatory markers and increased nocturnal GERD.  I  discussed sleeping on the left side would be better for reflux than compared to sleeping on the right side.  With his CAD, I also discussed potential risk of nocturnal hypoxemia contributing to nocturnal ischemia.  He has frequent urination and I extensively discussed the pathophysiology associated with this.  He ultimately received a new ResMed AirSense 10 AutoSet unit with [employer] of his DME company on August 09, 2023.  Initial monthly download from July 25 through July 07, 2023 shows that he met compliance standards with 100% use.  Average use was 6 hours and 55 minutes.  AHI was 5.4 at his pressure range of 7-14.  Subsequent download has shown an AHI of 4.8 from September 22 through September 04, 2023.  He has been having some difficulty with his mask.  Some of this may be related to the tubing coming anteriorly and when he gets on his side it leads to mask leak.  I have suggested he change his mask to a ResMed AirFit F30i mask and he will contact [employer] regarding obtaining this.  He turned off his ramp time since he felt the pressure was too low.  Since his 95th percentile pressures have been ranging around 12, I will change  his pressure to a range of 9 to 15 cm of water.  Presently, he he is not planning to undergo surgery for his deviated septum which is still under consideration with Dr. Jenne Goodwin.  Insert all his questions.  His blood pressure today was stable and on repeat by me was 120/70.  He is maintaining sinus rhythm without recurrent AF.  He is on lisinopril 20 mg daily for hypertension.  He is anticoagulated on Eliquis.  He takes atorvastatin 20 mg for hyperlipidemia.  Lipid studies in February 2024 showed total cholesterol 110 with LDL 47.  See him on an as-needed basis in the future if problems arise.  I discussed with him my plan for future retirement most likely at sometime next year.   Medication Adjustments/Labs and Tests Ordered: Current medicines are reviewed at length with the patient  today.  Concerns regarding medicines are outlined above.  Medication changes, Labs and Tests ordered today are listed in the Patient Instructions below. Patient Instructions  Medication Instructions:  *If you need a refill on your cardiac medications before your next appointment, please call your pharmacy*   Follow-Up: At [facility], you and your health needs are our priority.  As part of our continuing mission to provide you with exceptional heart care, we have created designated Provider Care Teams.  These Care Teams include your primary Cardiologist (physician) and Advanced Practice Providers (APPs -  Physician Assistants and Nurse Practitioners) who all work together to provide you with the care you need, when you need it.  We recommend signing up for the patient portal called "MyChart".  Sign up information is provided on this After Visit Summary.  MyChart is used to connect with patients for Virtual Visits (Telemedicine).  Patients are able to view lab/test results, encounter notes, upcoming appointments, etc.  Non-urgent messages can be sent to your provider as well.   To learn more about what you can do with MyChart, go to [email or web address].    Your next appointment:   1 year(s)  Provider:   Margaree Mackintosh      Signed, Nicki Guadalajara, MD  09/06/2023 6:35 PM    [facility] [address], [place], [place]  [address] Phone: [phone]

## 2023-09-06 NOTE — Patient Instructions (Signed)
Medication Instructions:  *If you need a refill on your cardiac medications before your next appointment, please call your pharmacy*   Follow-Up: At [facility], you and your health needs are our priority.  As part of our continuing mission to provide you with exceptional heart care, we have created designated Provider Care Teams.  These Care Teams include your primary Cardiologist (physician) and Advanced Practice Providers (APPs -  Physician Assistants and Nurse Practitioners) who all work together to provide you with the care you need, when you need it.  We recommend signing up for the patient portal called "MyChart".  Sign up information is provided on this After Visit Summary.  MyChart is used to connect with patients for Virtual Visits (Telemedicine).  Patients are able to view lab/test results, encounter notes, upcoming appointments, etc.  Non-urgent messages can be sent to your provider as well.   To learn more about what you can do with MyChart, go to [email or web address].    Your next appointment:   1 year(s)  Provider:   Margaree Mackintosh

## 2023-09-08 DIAGNOSIS — G4733 Obstructive sleep apnea (adult) (pediatric): Secondary | ICD-10-CM | POA: Diagnosis not present

## 2023-09-19 DIAGNOSIS — G4733 Obstructive sleep apnea (adult) (pediatric): Secondary | ICD-10-CM | POA: Diagnosis not present

## 2023-09-27 ENCOUNTER — Ambulatory Visit: Payer: PPO | Admitting: Cardiovascular Disease

## 2023-10-03 DIAGNOSIS — H353211 Exudative age-related macular degeneration, right eye, with active choroidal neovascularization: Secondary | ICD-10-CM | POA: Diagnosis not present

## 2023-10-09 DIAGNOSIS — G4733 Obstructive sleep apnea (adult) (pediatric): Secondary | ICD-10-CM | POA: Diagnosis not present

## 2023-10-31 DIAGNOSIS — H353211 Exudative age-related macular degeneration, right eye, with active choroidal neovascularization: Secondary | ICD-10-CM | POA: Diagnosis not present

## 2023-11-01 ENCOUNTER — Encounter: Payer: Self-pay | Admitting: Family Medicine

## 2023-11-08 DIAGNOSIS — G4733 Obstructive sleep apnea (adult) (pediatric): Secondary | ICD-10-CM | POA: Diagnosis not present

## 2023-11-13 ENCOUNTER — Telehealth: Payer: Self-pay | Admitting: Cardiology

## 2023-11-13 NOTE — Telephone Encounter (Signed)
Pt c/o BP issue: STAT if pt c/o blurred vision, one-sided weakness or slurred speech  1. What are your last 5 BP readings? 85/66 70, 88/63 60, 92/65 92  2. Are you having any other symptoms (ex. Dizziness, headache, blurred vision, passed out)? dizziness  3. What is your BP issue? Low BP

## 2023-11-13 NOTE — Telephone Encounter (Signed)
Patient identification verified by 2 forms. Shade Flood, RN     Called and spoke to patient  Patient states: Bps over last three days swere 85/66 HR 70, 88/63 HR 60, 92/65 HR 92. Also experienced dizziness but not currently having this symptom during phone call.  Patient denies: headache, blurred vision, syncope, slurred speech or one-sided weakness.              Interventions/Plan: per DOD recommendation pt is to reduce LISINOPRIL dose to 10mg  daily until next appt with primary cardiologist at the end of this month.    Reviewed ED warning signs/precautions  Patient agrees with plan, no questions at this time

## 2023-11-19 ENCOUNTER — Other Ambulatory Visit: Payer: Self-pay | Admitting: Cardiology

## 2023-11-28 DIAGNOSIS — H353211 Exudative age-related macular degeneration, right eye, with active choroidal neovascularization: Secondary | ICD-10-CM | POA: Diagnosis not present

## 2023-11-28 NOTE — Telephone Encounter (Signed)
 Agree with recommendation

## 2023-12-07 NOTE — Progress Notes (Signed)
Cardiology Office Note:    Date:  12/08/2023   ID:  Edward Goodwin, DOB 13-Apr-1951, MRN [id number]  PCP:  Shelva Majestic, MD  Cardiologist:  Little Ishikawa, MD  Electrophysiologist:  None   Referring MD: Shelva Majestic, MD   Chief Complaint  Patient presents with   Atrial Fibrillation    History of Present Illness:    Edward Goodwin is a 73 y.o. male with a hx of CAD, paroxysmal atrial fibrillation, ulcerative colitis, hypertension, psoriasis who presents for follow-up.  He was referred by Alphonzo Severance, PA for evaluation of atrial fibrillation.  He was diagnosed with A-fib at ED visit 04/16/2022.  CHA2DS2-VASc score 3, was started on Eliquis 5 mg twice daily as well as diltiazem 30 mg as needed for palpitations/tachycardia.  He reports that he was dehydrated when he went into A-fib.  He felt tired but did not note any palpitations.  His wife checked his pulse and noted it was fast and irregular.  He reports occasional lightheadedness but denies any syncope.  Does report he has occasional chest pain.  Describes tightness in center of his chest.  Has not noted relationship with exertion.  He plays golf twice per week for exercise.  No lower extremity edema.  He is taking Eliquis, denies any bleeding issues.  Smoked for 20 years, quit in his early 48s.  Thinks father had MI in 50s.  Echocardiogram 05/02/2022 showed normal biventricular function, no significant valvular disease.  Coronary CTA on 07/11/2022 showed severe stenosis in mid to distal LAD (CT FFR 0.64), calcium score 610 (72nd percentile).  LHC 07/22/2022 showed heavily calcified 85% mid LAD stenosis; medical management recommended and consider PCI worsening symptoms.  Since last clinic visit, he reports he is doing okay.  States that he had some A-fib after Christmas.  He was in A-fib for 3 days, from 12/30 through 1/1.  BP was low during that time, he decreased his lisinopril to 10 mg daily.  Heart rate was up to 90s to 100s.   He did feel some lightheadedness on standing but denies any syncope.  He felt palpitations while in A-fib.  He denies any chest pain, has not had to take nitroglycerin recently.  Denies any shortness of breath.  No lower extremity edema.   Past Medical History:  Diagnosis Date   Chicken pox    Colitis, ulcerative (HCC)    Hypertension    Macular degeneration, bilateral 07/2012   areds 2    Psoriasis     Past Surgical History:  Procedure Laterality Date   ACNE CYST REMOVAL     CATARACT EXTRACTION W/ INTRAOCULAR LENS IMPLANT  10/2014   bilat   COLONOSCOPY     FOOT SURGERY  1988   LEFT HEART CATH AND CORONARY ANGIOGRAPHY N/A 07/22/2022   Procedure: LEFT HEART CATH AND CORONARY ANGIOGRAPHY;  Surgeon: Lyn Records, MD;  Location: [facility] INVASIVE [facility];  Service: Cardiovascular;  Laterality: N/A;   SHOULDER ARTHROSCOPY  2007   Right   TONSILLECTOMY      Current Medications: Current Meds  Medication Sig   acetaminophen (TYLENOL) 650 MG CR tablet Take 650 mg by mouth every 8 (eight) hours as needed for pain.   Ascorbic Acid (VITAMIN C PO) Take 1 tablet by mouth daily. 1000 mg   atorvastatin (LIPITOR) 20 MG tablet TAKE 1 TABLET BY MOUTH EVERY DAY   clobetasol ointment (TEMOVATE) 0.05 % Apply 1 Application topically 2 (two) times daily as  needed (irritation).   diltiazem (CARDIZEM) 30 MG tablet Take one tablet by mouth every 4 hrs as needed, Heart rate needs to be above 100, top number blood pressure above 100   diphenhydramine-acetaminophen (TYLENOL PM) 25-500 MG TABS tablet Take 1 tablet by mouth at bedtime as needed (pain/sleep).   ELIQUIS 5 MG TABS tablet TAKE 1 TABLET BY MOUTH TWICE A DAY   faricimab-svoa (VABYSMO) 6 MG/0.05ML SOLN intravitreal injection 6 mg by Intravitreal route once.   isosorbide mononitrate (IMDUR) 30 MG 24 hr tablet Take 1 tablet (30 mg total) by mouth daily.   L-Lysine 1000 MG TABS Take 1,000 mg by mouth daily.   lisinopril (ZESTRIL) 20 MG tablet TAKE 1 TABLET  BY MOUTH EVERY DAY   Multiple Vitamins-Minerals (PRESERVISION AREDS 2) CAPS Take 1 capsule by mouth 2 (two) times daily before lunch and supper.   Propylene Glycol (SYSTANE COMPLETE OP) Place 1 drop into both eyes 2 (two) times daily.     Allergies:   Sulfonamide derivatives   Social History   Socioeconomic History   Marital status: Married    Spouse name: Not on file   Number of children: 1   Years of education: 12   Highest education level: Associate degree: occupational, Scientist, product/process development, or vocational program  Occupational History   Occupation: Systems developer    Comment: retired   Tobacco Use   Smoking status: Former    Current packs/day: 0.00    Types: Cigarettes    Quit date: 10/23/1989    Years since quitting: 34.1   Smokeless tobacco: Never  Vaping Use   Vaping status: Never Used  Substance and Sexual Activity   Alcohol use: Not Currently    Comment: rare glass of wine - less than once a year   Drug use: No   Sexual activity: Not Currently  Other Topics Concern   Not on file  Social History Narrative   Married 1971. 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 [employer] 6 years.       ACP: HCPOA - wife. Yes for acute CPR, no for prolonged mechanical ventilatory support, no for prolonged artificial nutrition or other heroic measures leaving him in an incapacitated state.    Social Drivers of Corporate investment banker Strain: Low Risk  (03/01/2023)   Overall Financial Resource Strain (CARDIA)    Difficulty of Paying Living Expenses: Not hard at all  Food Insecurity: No Food Insecurity (03/01/2023)   Hunger Vital Sign    Worried About Running Out of Food in the Last Year: Never true    Ran Out of Food in the Last Year: Never true  Transportation Needs: No Transportation Needs (03/01/2023)   PRAPARE - Administrator, Civil Service (Medical): No     Lack of Transportation (Non-Medical): No  Physical Activity: Unknown (03/01/2023)   Exercise Vital Sign    Days of Exercise per Week: Patient declined    Minutes of Exercise per Session: 150+ min  Stress: No Stress Concern Present (03/01/2023)   [employer] of Occupational Health - Occupational Stress Questionnaire    Feeling of Stress : Not at all  Social Connections: Socially Integrated (03/01/2023)   Social Connection and Isolation Panel [NHANES]    Frequency of Communication with Friends and Family: More than three times a week    Frequency of Social  Gatherings with Friends and Family: More than three times a week    Attends Religious Services: More than 4 times per year    Active Member of Clubs or Organizations: Yes    Attends Engineer, structural: More than 4 times per year    Marital Status: Married     Family History: The patient's family history includes Arthritis in his paternal grandmother; Breast cancer in his mother; Dementia in his mother; Diabetes in his daughter and maternal uncle; Emphysema in his maternal grandfather; Heart disease in his father and mother; Hepatitis C in his brother; Hypertension in his father and mother; Hypothyroidism in his mother; Stroke in his maternal grandmother and mother. There is no history of Colon cancer, Esophageal cancer, Stomach cancer, or Rectal cancer.  ROS:   Please see the history of present illness.     All other systems reviewed and are negative.  EKGs/Labs/Other Studies Reviewed:    The following studies were reviewed today:   EKG:   06/20/2022: Normal sinus rhythm, rate 61, no ST abnormality 07/15/22: NSR, rate 59 12/20/22: Sinus bradycardia, rate 55, no ST abnormality 06/07/23: NSR, rate 61, no ST abnormalities 12/08/23: Sinus bradycardia, rate 57, no ST abnormalities  Recent Labs: 01/05/2023: ALT 10 02/27/2023: BUN 26; Creatinine, Ser 1.15; Hemoglobin 14.0; Platelets 142; Potassium 4.4; Sodium 141  Recent  Lipid Panel    Component Value Date/Time   CHOL 110 01/05/2023 0850   TRIG 84.0 01/05/2023 0850   TRIG 58 09/01/2006 0749   HDL 45.60 01/05/2023 0850   CHOLHDL 2 01/05/2023 0850   VLDL 16.8 01/05/2023 0850   LDLCALC 47 01/05/2023 0850    Physical Exam:    VS:  BP 98/64 (BP Location: Left Arm, Patient Position: Sitting)   Pulse (!) 57   Ht 5\' 9"  (1.753 m)   Wt 198 lb (89.8 kg)   SpO2 99%   BMI 29.24 kg/m     Wt Readings from Last 3 Encounters:  12/08/23 198 lb (89.8 kg)  09/06/23 201 lb 9.6 oz (91.4 kg)  07/20/23 196 lb 6.4 oz (89.1 kg)     GEN:  Well nourished, well developed in no acute distress HEENT: Normal NECK: No JVD; No carotid bruits CARDIAC: RRR, no murmurs, rubs, gallops RESPIRATORY:  Clear to auscultation without rales, wheezing or rhonchi  ABDOMEN: Soft, non-tender, non-distended MUSCULOSKELETAL:  No edema; No deformity  SKIN: Warm and dry NEUROLOGIC:  Alert and oriented x 3 PSYCHIATRIC:  Normal affect   ASSESSMENT:    1. Coronary artery disease of native artery of native heart with stable angina pectoris (HCC)   2. Paroxysmal atrial fibrillation (HCC)   3. Essential hypertension   4. Hyperlipidemia LDL goal <70   5. OSA (obstructive sleep apnea)       PLAN:    CAD: Reported atypical chest pain.  Echocardiogram 05/02/2022 showed normal biventricular function, no significant valvular disease.  Coronary CTA on 07/11/2022 showed severe stenosis in mid to distal LAD (CT FFR 0.64), calcium score 610 (72nd percentile).  LHC 07/22/2022 showed heavily calcified 85% mid LAD stenosis; medical management recommended and consider PCI if worsening symptoms. -Continue Eliquis -Continue atorvastatin -Continue Imdur 30 mg daily.  Reports no recent angina.  If chest pain despite antianginals, plan for PCI -As needed sublingual nitroglycerin  Paroxysmal atrial fibrillation: He was diagnosed with A-fib at ED visit 04/16/2022.  CHA2DS2-VASc score 3, was started on Eliquis  5 mg twice daily as well as diltiazem 30 mg as needed  for palpitations/tachycardia.  Echo 05/02/22 showed normal biventricular function, no significant valvular disease.  -Continue Eliquis 5 mg twice daily -Continue diltiazem 30 mg as needed.  No scheduled AV nodal blockers given low resting heart rate -Zio patch x 7 days 06/20/2022 showed no A-fib, 49 episodes of SVT with longest lasting 12 seconds with average rate 132 bpm.   -Discussed Kardia mobile for long term Afib monitoring -Is currently not using his CPAP, recommend follow-up with Dr. Tresa Endo as will be important to treat his OSA in preventing further A-fib -Had episode of A-fib after Christmas that he thinks was triggered by dehydration.  Encouraged to stay well-hydrated.  If having more A-fib would recommend referral to EP for evaluation of antiarrhythmics versus ablation  Hypertension: On lisinopril 10 mg daily and Imdur 30 mg daily.  Appears controlled  Hyperlipidemia: On atorvastatin 20 mg daily.  LDL 47 on 01/05/2023  OSA: diagnosed in 09/2022, started on CPAP but having issues with it and has not been using recently.  Recommend follow-up with Dr. Tresa Endo   RTC in 6 months    Medication Adjustments/Labs and Tests Ordered: Current medicines are reviewed at length with the patient today.  Concerns regarding medicines are outlined above.  Orders Placed This Encounter  Procedures   EKG 12-Lead   No orders of the defined types were placed in this encounter.   Patient Instructions  Medication Instructions:  Continue current medications *If you need a refill on your cardiac medications before your next appointment, please call your pharmacy*   Lab Work: none If you have labs (blood work) drawn today and your tests are completely normal, you will receive your results only by: MyChart Message (if you have MyChart) OR A paper copy in the mail If you have any lab test that is abnormal or we need to change your treatment, we will  call you to review the results.   Testing/Procedures: none   Follow-Up: At [facility], you and your health needs are our priority.  As part of our continuing mission to provide you with exceptional heart care, we have created designated Provider Care Teams.  These Care Teams include your primary Cardiologist (physician) and Advanced Practice Providers (APPs -  Physician Assistants and Nurse Practitioners) who all work together to provide you with the care you need, when you need it.  We recommend signing up for the patient portal called "MyChart".  Sign up information is provided on this After Visit Summary.  MyChart is used to connect with patients for Virtual Visits (Telemedicine).  Patients are able to view lab/test results, encounter notes, upcoming appointments, etc.  Non-urgent messages can be sent to your provider as well.   To learn more about what you can do with MyChart, go to [email or web address].    Your next appointment:   6 month(s)  Provider:   Dr. Tresa Endo  Other Instructions Recommend follow up with Dr. Tresa Endo for sleep medicine Your provider recommends you purchase a [facility] device          Signed, Little Ishikawa, MD  12/08/2023 8:45 AM    [facility]

## 2023-12-08 ENCOUNTER — Encounter: Payer: Self-pay | Admitting: Cardiology

## 2023-12-08 ENCOUNTER — Ambulatory Visit: Payer: PPO | Attending: Cardiology | Admitting: Cardiology

## 2023-12-08 ENCOUNTER — Other Ambulatory Visit: Payer: Self-pay | Admitting: *Deleted

## 2023-12-08 VITALS — BP 98/64 | HR 57 | Ht 69.0 in | Wt 198.0 lb

## 2023-12-08 DIAGNOSIS — G4733 Obstructive sleep apnea (adult) (pediatric): Secondary | ICD-10-CM | POA: Diagnosis not present

## 2023-12-08 DIAGNOSIS — I480 Paroxysmal atrial fibrillation: Secondary | ICD-10-CM

## 2023-12-08 DIAGNOSIS — I10 Essential (primary) hypertension: Secondary | ICD-10-CM | POA: Diagnosis not present

## 2023-12-08 DIAGNOSIS — E785 Hyperlipidemia, unspecified: Secondary | ICD-10-CM

## 2023-12-08 DIAGNOSIS — I25118 Atherosclerotic heart disease of native coronary artery with other forms of angina pectoris: Secondary | ICD-10-CM | POA: Diagnosis not present

## 2023-12-08 MED ORDER — LISINOPRIL 10 MG PO TABS: 10.0000 mg | ORAL_TABLET | Freq: Every day | ORAL | 3 refills | Status: DC

## 2023-12-08 NOTE — Patient Instructions (Addendum)
Medication Instructions:  Continue current medications *If you need a refill on your cardiac medications before your next appointment, please call your pharmacy*   Lab Work: none If you have labs (blood work) drawn today and your tests are completely normal, you will receive your results only by: MyChart Message (if you have MyChart) OR A paper copy in the mail If you have any lab test that is abnormal or we need to change your treatment, we will call you to review the results.   Testing/Procedures: none   Follow-Up: At [facility], you and your health needs are our priority.  As part of our continuing mission to provide you with exceptional heart care, we have created designated Provider Care Teams.  These Care Teams include your primary Cardiologist (physician) and Advanced Practice Providers (APPs -  Physician Assistants and Nurse Practitioners) who all work together to provide you with the care you need, when you need it.  We recommend signing up for the patient portal called "MyChart".  Sign up information is provided on this After Visit Summary.  MyChart is used to connect with patients for Virtual Visits (Telemedicine).  Patients are able to view lab/test results, encounter notes, upcoming appointments, etc.  Non-urgent messages can be sent to your provider as well.   To learn more about what you can do with MyChart, go to [email or web address].    Your next appointment:   6 month(s)  Provider:   Dr. Bjorn Pippin   Other Instructions Recommend follow up with Dr. Tresa Endo for sleep medicine Your provider recommends you purchase a [facility] device

## 2023-12-09 DIAGNOSIS — G4733 Obstructive sleep apnea (adult) (pediatric): Secondary | ICD-10-CM | POA: Diagnosis not present

## 2023-12-21 ENCOUNTER — Ambulatory Visit: Payer: PPO | Admitting: Family Medicine

## 2023-12-21 ENCOUNTER — Encounter: Payer: Self-pay | Admitting: Family Medicine

## 2023-12-21 VITALS — BP 116/68 | HR 67 | Temp 97.9°F | Ht 69.0 in | Wt 198.0 lb

## 2023-12-21 DIAGNOSIS — I480 Paroxysmal atrial fibrillation: Secondary | ICD-10-CM

## 2023-12-21 DIAGNOSIS — B029 Zoster without complications: Secondary | ICD-10-CM

## 2023-12-21 DIAGNOSIS — D6869 Other thrombophilia: Secondary | ICD-10-CM | POA: Diagnosis not present

## 2023-12-21 DIAGNOSIS — I10 Essential (primary) hypertension: Secondary | ICD-10-CM | POA: Diagnosis not present

## 2023-12-21 MED ORDER — VALACYCLOVIR HCL 1 G PO TABS: 1000.0000 mg | ORAL_TABLET | Freq: Three times a day (TID) | ORAL | 0 refills | Status: DC

## 2023-12-21 NOTE — Addendum Note (Signed)
 Addended by: Almira Jaeger on: 12/21/2023 09:21 PM   Modules accepted: Level of Service

## 2023-12-21 NOTE — Progress Notes (Signed)
 She still is not Phone [phone] In person visit   Subjective:   Edward Goodwin is a 73 y.o. year old very pleasant male patient who presents for/with See problem oriented charting Chief Complaint  Patient presents with   eye and ear pain    Pt c/o sharp pain thats sporatic in left ear and above left eye that started Tuesday night.  Allowance taken that she was via Past Medical History-  Patient Active Problem List   Diagnosis Date Noted   Paroxysmal atrial fibrillation (HCC) 04/21/2022    Priority: High   Secondary hypercoagulable state (HCC) 04/21/2022    Priority: High   Coronary artery disease 12/16/2021    Priority: High   Abdominal aortic aneurysm (AAA) without rupture, unspecified part (HCC)     Priority: High   Ulcerative colitis (HCC) 08/20/2007    Priority: High   Former smoker 04/19/2018    Priority: Medium    Hyperlipidemia 04/19/2018    Priority: Medium    Vertigo 12/19/2017    Priority: Medium    Gout 03/24/2014    Priority: Medium    Elevated PSA, less than 10 ng/ml 09/09/2013    Priority: Medium    Macular degeneration, bilateral 07/15/2012    Priority: Medium    Obesity (BMI 30.0-34.9) 12/02/2008    Priority: Medium    Essential hypertension 08/20/2007    Priority: Medium    PSORIASIS 08/20/2007    Priority: Medium    Onychomycosis 12/19/2017    Priority: Low   Trochanteric bursitis of left hip 03/31/2015    Priority: Low    Medications- reviewed and updated Current Outpatient Medications  Medication Sig Dispense Refill   acetaminophen  (TYLENOL ) 650 MG CR tablet Take 650 mg by mouth every 8 (eight) hours as needed for pain.     Ascorbic Acid (VITAMIN C PO) Take 1 tablet by mouth daily. 1000 mg     atorvastatin  (LIPITOR) 20 MG tablet TAKE 1 TABLET BY MOUTH EVERY DAY 90 tablet 3   clobetasol  ointment (TEMOVATE ) 0.05 % Apply 1 Application topically 2 (two) times daily as needed (irritation).     diltiazem  (CARDIZEM ) 30 MG tablet Take one tablet  by mouth every 4 hrs as needed, Heart rate needs to be above 100, top number blood pressure above 100 45 tablet 1   diphenhydramine-acetaminophen  (TYLENOL  PM) 25-500 MG TABS tablet Take 1 tablet by mouth at bedtime as needed (pain/sleep).     ELIQUIS  5 MG TABS tablet TAKE 1 TABLET BY MOUTH TWICE A DAY 60 tablet 5   faricimab -svoa (VABYSMO ) 6 MG/0.05ML SOLN intravitreal injection 6 mg by Intravitreal route once.     isosorbide  mononitrate (IMDUR ) 30 MG 24 hr tablet Take 1 tablet (30 mg total) by mouth daily. 90 tablet 3   L-Lysine 1000 MG TABS Take 1,000 mg by mouth daily.     lisinopril  (ZESTRIL ) 10 MG tablet Take 1 tablet (10 mg total) by mouth daily. 90 tablet 3   Multiple Vitamins-Minerals (PRESERVISION AREDS 2) CAPS Take 1 capsule by mouth 2 (two) times daily before lunch and supper.     Propylene Glycol (SYSTANE COMPLETE OP) Place 1 drop into both eyes 2 (two) times daily.     valACYclovir  (VALTREX ) 1000 MG tablet Take 1 tablet (1,000 mg total) by mouth 3 (three) times daily. For 7 days for shingles. 21 tablet 0   nitroGLYCERIN  (NITROSTAT ) 0.4 MG SL tablet Place 1 tablet (0.4 mg total) under the tongue every 5 (five) minutes as  needed. 25 tablet 3   No current facility-administered medications for this visit.     Objective:  BP 116/68   Pulse 67   Temp 97.9 F (36.6 C)   Ht 5' 9 (1.753 m)   Wt 198 lb (89.8 kg)   SpO2 97%   BMI 29.24 kg/m  Gen: NAD, resting comfortably CV: RRR no murmurs rubs or gallops Lungs: CTAB no crackles, wheeze, rhonchi Skin: warm, dry Several erythematous papules on left forehead.  Does not cross the midline.  No lesions noted eyebrow or below-does report on the medial portion of eyebrow mild tenderness.  The eye itself is normal without inflammation-sclera appears normal     Assessment and Plan   # Left ear pain S: Patient reports on Tuesday night started with sharp pain in his left ear that is sporadic and short lived- maybe moderate level pain.   Also notes some pain above the left eye that is more persistent- mild dull ache (occasional headaches in sinuses in past but not recently-can't take sudafed anymore with a fib ). Can also hurt into his throat on left and radiate to the ear -played golf Tuesday and felt well other than some leg cramps - felt dehydratoin related.  -no rash reported but in retrospect noted a pimple like area on his scalp that he felt. No vision change including no blurry vision  -no fever/chills. Mild slight cough not not new. No chest pain or shortness of breath   A/P: Interestingly enough I think this is a very mild case of shingles. Im glad you had both vaccines- I think that's why its so mild. We are seeing red patches on left forehead along with skin sensitivity- I think that pain is radiating to the ear. I'm glad its mainly on the forehead- and that you have eye doctor visit in a few days- if worsens or involves the eyelid or anything closer to the eye call to see them sooner.  -take valtrex  1000mg  three times a day for 7 days- try to go ahead and pick up and get 3 doses in today.  Starting within 48 hours of symptom onset -new or worsening symptoms please let us  know immediately- particularly blurry vision or fever  -This is in the distribution of herpes zoster ophthalmicus but he has no no lesions or significant tenderness below the eyebrow-he is already seeing ophthalmology next Tuesday so we did not place an urgent referral today-Valtrex  would be the appropriate treatment regardless  # Atrial fibrillation #Secondary hypercoagulable state-noted due to atrial fibrillation S: Rate controlled with diltiazem  30 mg only as needed for elevated heart rate/palpitations/heart racing-he denies needing this recently Anticoagulated with Eliquis  5 mg twice daily  Chadsvasc score of 3- Patient is  followed by cardiology: Atrial fibrillation clinic A/P: appropriately anticoagulated and rate controlled (possibly in sinus based  on exam but cannot prove without EKG)- continue current medicine     #hypertension S: medication: Lisinopril  10 mg once daily, imdur  30 mg A/P: Blood pressure well-controlled on repeat-continue current medication-lisinopril  was reduced by cardiology to 10 mg from 20 mg but he is doing well with this  Recommended follow up: Return for next already scheduled visit or sooner if needed.  But we discussed sooner precautions as needed Future Appointments  Date Time Provider Department Center  01/25/2024  2:20 PM Katrinka Garnette KIDD, MD [facility]  02/05/2024  1:00 PM [facility] ANNUAL WELLNESS VISIT 1 [facility]    Lab/Order associations:   ICD-10-CM  1. Herpes zoster without complication  B02.9     2. Paroxysmal atrial fibrillation (HCC)  I48.0     3. Essential hypertension  I10     4. Secondary hypercoagulable state (HCC)  D68.69       Meds ordered this encounter  Medications   valACYclovir  (VALTREX ) 1000 MG tablet    Sig: Take 1 tablet (1,000 mg total) by mouth 3 (three) times daily. For 7 days for shingles.    Dispense:  21 tablet    Refill:  0    Return precautions advised.  Garnette Lukes, MD

## 2023-12-21 NOTE — Patient Instructions (Addendum)
 Interestingly enough I think this is a very mild case of shingles. Im glad you had both vaccines- I think that's why its so mild. We are seeing red patches on left forehead along with skin sensitivity- I think that pain is radiating to the ear. I'm glad its mainly on the forehead- and that you have eye doctor visit in a few days- if worsens or involves the eyelid or anything closer to the eye call to see them sooner.  -take valtrex  1000mg  three times a day for 7 days- try to go ahead and pick up and get 3 doses in today -new or worsening symptoms please let us  know immediately- particularly blurry vision or fever  Recommended follow up: Return for next already scheduled visit or sooner if needed.

## 2024-01-04 ENCOUNTER — Encounter: Payer: Self-pay | Admitting: Family Medicine

## 2024-01-08 ENCOUNTER — Other Ambulatory Visit: Payer: Self-pay

## 2024-01-08 DIAGNOSIS — H353212 Exudative age-related macular degeneration, right eye, with inactive choroidal neovascularization: Secondary | ICD-10-CM | POA: Diagnosis not present

## 2024-01-08 MED ORDER — GABAPENTIN 100 MG PO CAPS: 100.0000 mg | ORAL_CAPSULE | Freq: Every day | ORAL | 3 refills | Status: DC

## 2024-01-09 DIAGNOSIS — G4733 Obstructive sleep apnea (adult) (pediatric): Secondary | ICD-10-CM | POA: Diagnosis not present

## 2024-01-09 DIAGNOSIS — H353122 Nonexudative age-related macular degeneration, left eye, intermediate dry stage: Secondary | ICD-10-CM | POA: Diagnosis not present

## 2024-01-09 DIAGNOSIS — H35033 Hypertensive retinopathy, bilateral: Secondary | ICD-10-CM | POA: Diagnosis not present

## 2024-01-09 DIAGNOSIS — H43813 Vitreous degeneration, bilateral: Secondary | ICD-10-CM | POA: Diagnosis not present

## 2024-01-09 DIAGNOSIS — H353211 Exudative age-related macular degeneration, right eye, with active choroidal neovascularization: Secondary | ICD-10-CM | POA: Diagnosis not present

## 2024-01-25 ENCOUNTER — Encounter: Payer: Self-pay | Admitting: Family Medicine

## 2024-01-25 ENCOUNTER — Ambulatory Visit (INDEPENDENT_AMBULATORY_CARE_PROVIDER_SITE_OTHER): Payer: PPO | Admitting: Family Medicine

## 2024-01-25 VITALS — BP 104/70 | HR 64 | Temp 97.3°F | Ht 69.0 in | Wt 197.6 lb

## 2024-01-25 DIAGNOSIS — I480 Paroxysmal atrial fibrillation: Secondary | ICD-10-CM

## 2024-01-25 DIAGNOSIS — Z125 Encounter for screening for malignant neoplasm of prostate: Secondary | ICD-10-CM | POA: Diagnosis not present

## 2024-01-25 DIAGNOSIS — I714 Abdominal aortic aneurysm, without rupture, unspecified: Secondary | ICD-10-CM | POA: Diagnosis not present

## 2024-01-25 DIAGNOSIS — E663 Overweight: Secondary | ICD-10-CM

## 2024-01-25 DIAGNOSIS — I10 Essential (primary) hypertension: Secondary | ICD-10-CM | POA: Diagnosis not present

## 2024-01-25 DIAGNOSIS — E785 Hyperlipidemia, unspecified: Secondary | ICD-10-CM

## 2024-01-25 DIAGNOSIS — Z131 Encounter for screening for diabetes mellitus: Secondary | ICD-10-CM

## 2024-01-25 DIAGNOSIS — K5100 Ulcerative (chronic) pancolitis without complications: Secondary | ICD-10-CM | POA: Diagnosis not present

## 2024-01-25 DIAGNOSIS — D6869 Other thrombophilia: Secondary | ICD-10-CM

## 2024-01-25 DIAGNOSIS — Z0000 Encounter for general adult medical examination without abnormal findings: Secondary | ICD-10-CM | POA: Diagnosis not present

## 2024-01-25 NOTE — Patient Instructions (Addendum)
 If you get any other days where you feel lightheaded like that check blood pressure- I'm tempted to reduce lisinopril further to 5 mg especially with reading today at 104/70 and home readings around 110/70 lately  seems to be mild issue but I want him to monitor the lip quiver issue- if progressive want to see him back to reevaluate- I didn't note during exam today   Please stop by lab before you go If you have mychart- we will send your results within 3 business days of [place] receiving them.  If you do not have mychart- we will call you about results within 5 business days of [place] receiving them.  *please also note that you will see labs on mychart as soon as they post. I will later go in and write notes on them- will say "notes from Dr. Durene Cal"   Recommended follow up: Return in about 6 months (around 07/27/2024) for followup or sooner if needed.Schedule b4 you leave.

## 2024-01-25 NOTE — Progress Notes (Signed)
 Phone: [phone]   Subjective:  Patient presents today for their annual physical. Chief complaint-noted.   See problem oriented charting- ROS- full  review of systems was completed and negative  except for: Some bottom lip quivering and balance issues, some cough with recent yard work. Stable nocturia  The following were reviewed and entered/updated in epic: Past Medical History:  Diagnosis Date   Chicken pox    Colitis, ulcerative (HCC)    Hypertension    Macular degeneration, bilateral 07/2012   areds 2    Psoriasis    Patient Active Problem List   Diagnosis Date Noted   Paroxysmal atrial fibrillation (HCC) 04/21/2022    Priority: High   Secondary hypercoagulable state (HCC) 04/21/2022    Priority: High   Coronary artery disease 12/16/2021    Priority: High   Abdominal aortic aneurysm (AAA) without rupture, unspecified part (HCC)     Priority: High   Ulcerative colitis (HCC) 08/20/2007    Priority: High   Former smoker 04/19/2018    Priority: Medium    Hyperlipidemia 04/19/2018    Priority: Medium    Vertigo 12/19/2017    Priority: Medium    Gout 03/24/2014    Priority: Medium    Elevated PSA, less than 10 ng/ml 09/09/2013    Priority: Medium    Macular degeneration, bilateral 07/15/2012    Priority: Medium    Obesity (BMI 30.0-34.9) 12/02/2008    Priority: Medium    Essential hypertension 08/20/2007    Priority: Medium    PSORIASIS 08/20/2007    Priority: Medium    Onychomycosis 12/19/2017    Priority: Low   Trochanteric bursitis of left hip 03/31/2015    Priority: Low   Past Surgical History:  Procedure Laterality Date   ACNE CYST REMOVAL     CATARACT EXTRACTION W/ INTRAOCULAR LENS IMPLANT  10/2014   bilat   COLONOSCOPY     FOOT SURGERY  1988   LEFT HEART CATH AND CORONARY ANGIOGRAPHY N/A 07/22/2022   Procedure: LEFT HEART CATH AND CORONARY ANGIOGRAPHY;  Surgeon: Lyn Records, MD;  Location: [facility];  Service: Cardiovascular;   Laterality: N/A;   SHOULDER ARTHROSCOPY  2007   Right   TONSILLECTOMY      Family History  Problem Relation Age of Onset   Heart disease Mother    Breast cancer Mother    Hypertension Mother    Stroke Mother    Hypothyroidism Mother    Dementia Mother    Heart disease Father        CAD/MI 30s or 49s smoker. led to death age 82.    Hypertension Father    Diabetes Daughter    Diabetes Maternal Uncle    Hepatitis C Brother    Stroke Maternal Grandmother    Emphysema Maternal Grandfather    Arthritis Paternal Grandmother    Colon cancer Neg Hx    Esophageal cancer Neg Hx    Stomach cancer Neg Hx    Rectal cancer Neg Hx     Medications- reviewed and updated Current Outpatient Medications  Medication Sig Dispense Refill   acetaminophen (TYLENOL) 650 MG CR tablet Take 650 mg by mouth every 8 (eight) hours as needed for pain.     Ascorbic Acid (VITAMIN C PO) Take 1 tablet by mouth daily. 1000 mg     atorvastatin (LIPITOR) 20 MG tablet TAKE 1 TABLET BY MOUTH EVERY DAY 90 tablet 3   clobetasol ointment (TEMOVATE) 0.05 % Apply 1 Application topically  2 (two) times daily as needed (irritation).     diltiazem (CARDIZEM) 30 MG tablet Take one tablet by mouth every 4 hrs as needed, Heart rate needs to be above 100, top number blood pressure above 100 45 tablet 1   ELIQUIS 5 MG TABS tablet TAKE 1 TABLET BY MOUTH TWICE A DAY 60 tablet 5   faricimab-svoa (VABYSMO) 6 MG/0.05ML SOLN intravitreal injection 6 mg by Intravitreal route once.     gabapentin (NEURONTIN) 100 MG capsule Take 1 capsule (100 mg total) by mouth at bedtime. 90 capsule 3   isosorbide mononitrate (IMDUR) 30 MG 24 hr tablet Take 1 tablet (30 mg total) by mouth daily. 90 tablet 3   L-Lysine 1000 MG TABS Take 1,000 mg by mouth daily.     lisinopril (ZESTRIL) 10 MG tablet Take 1 tablet (10 mg total) by mouth daily. 90 tablet 3   Multiple Vitamins-Minerals (PRESERVISION AREDS 2) CAPS Take 1 capsule by mouth 2 (two) times daily  before lunch and supper.     Propylene Glycol (SYSTANE COMPLETE OP) Place 1 drop into both eyes 2 (two) times daily.     valACYclovir (VALTREX) 1000 MG tablet Take 1 tablet (1,000 mg total) by mouth 3 (three) times daily. For 7 days for shingles. 21 tablet 0   diphenhydramine-acetaminophen (TYLENOL PM) 25-500 MG TABS tablet Take 1 tablet by mouth at bedtime as needed (pain/sleep). (Patient not taking: Reported on 01/25/2024)     nitroGLYCERIN (NITROSTAT) 0.4 MG SL tablet Place 1 tablet (0.4 mg total) under the tongue every 5 (five) minutes as needed. 25 tablet 3   No current facility-administered medications for this visit.    Allergies-reviewed and updated Allergies  Allergen Reactions   Sulfonamide Derivatives     "have just been told that my entire life"    Social History   Social History Narrative   Married 1971. 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 [employer] 6 years.       ACP: HCPOA - wife. Yes for acute CPR, no for prolonged mechanical ventilatory support, no for prolonged artificial nutrition or other heroic measures leaving him in an incapacitated state.    Objective  Objective:  BP 104/70   Pulse 64   Temp (!) 97.3 F (36.3 C)   Ht 5\' 9"  (1.753 m)   Wt 197 lb 9.6 oz (89.6 kg)   SpO2 96%   BMI 29.18 kg/m  Gen: NAD, resting comfortably HEENT: Mucous membranes are moist. Oropharynx normal Neck: no thyromegaly CV: RRR no murmurs rubs or gallops Lungs: CTAB no crackles, wheeze, rhonchi Abdomen: soft/nontender/nondistended/normal bowel sounds. No rebound or guarding.  Ext: no edema Skin: warm, dry Neuro: grossly normal, moves all extremities, PERRLA Declines genitourinary or rectal   Assessment and Plan  73 y.o. male presenting for annual physical.  Health Maintenance counseling: 1. Anticipatory guidance: Patient counseled regarding regular  dental exams -q6 months, eye exams -yearly and more regular than that as sees Dr. Lelan Pons and for retina and received injections ,  avoiding smoking and second hand smoke , limiting alcohol to 2 beverages per day - very rare to none, no illicit drugs .   2. Risk factor reduction:  Advised patient of need for regular exercise and diet rich and fruits and vegetables to reduce risk of heart attack and stroke.  Exercise- still playing golf  and starting back in the yard Diet/weight management-weight up 9 pounds from last physical-peak weight of 271- focus on maintenance to mild weight loss.  Wt Readings from Last 3 Encounters:  01/25/24 197 lb 9.6 oz (89.6 kg)  12/21/23 198 lb (89.8 kg)  12/08/23 198 lb (89.8 kg)   3. Immunizations/screenings/ancillary studies-fully at date  Immunization History  Administered Date(s) Administered   Fluad Quad(high Dose 65+) 08/27/2019, 09/19/2020, 08/17/2022   Fluad Trivalent(High Dose 65+) 08/16/2023   Influenza Split 10/03/2011   Influenza Whole 08/29/2012   Influenza, High Dose Seasonal PF 08/17/2015, 08/17/2015, 09/01/2016, 08/22/2018, 08/27/2021   Influenza,inj,Quad PF,6+ Mos 09/05/2013, 09/08/2014, 09/19/2020   Influenza-Unspecified 08/17/2017, 08/28/2018   Moderna Covid-19 Fall Seasonal Vaccine 37yrs & older 11/01/2023   PFIZER Comirnaty(Gray Top)Covid-19 Tri-Sucrose Vaccine 05/28/2021, 10/01/2022   PFIZER(Purple Top)SARS-COV-2 Vaccination 01/07/2020, 01/28/2020, 08/25/2020   PNEUMOCOCCAL CONJUGATE-20 04/28/2021   Pfizer Covid-19 Vaccine Bivalent Booster 64yrs & up 10/16/2021   Pfizer(Comirnaty)Fall Seasonal Vaccine 12 years and older 10/01/2022   Pneumococcal Conjugate-13 10/08/2013   Pneumococcal Polysaccharide-23 10/02/2009, 02/04/2015   Td 10/02/2009   Tdap 11/26/2019   Zoster Recombinant(Shingrix) 12/28/2018, 10/05/2019   Zoster, Live 12/04/2009  4. Prostate cancer screening-  low risk prior trend- update psa today.  He had a good friend  diagnosed with prostate cancer and has wanted to continue screening-he has nocturia 2-3 times a night Lab Results  Component Value Date   PSA 2.92 01/05/2023   PSA 2.68 12/16/2021   PSA 2.93 12/03/2020   5. Colon cancer screening - July 2016 with possible 10-year repeat with Dr. Leone Payor 6. Skin cancer screening- dermatology yearly. advised regular sunscreen use. Denies worrisome, changing, or new skin lesions.  7. Smoking associated screening (lung cancer screening, AAA screen 65-75, UA)-former smoker- see discussion below on abdominal aortic aneurysm  We have opted to get a urinalysis at least every 5 years-last in 2022 as he quit smoking in 1990. 8. STD screening -monogamous  Status of chronic or acute concerns   # Lip quiver S:3-4 weeks ago and lasts only about 5 seconds. He hasn't noticed it but wife has noticed it.bottom lip only.   A/P: seems to be mild issue but I want him to monitor the lip quiver issue- if progressive want to see him back to reevaluate- I didn't note during exam today    # Balance issues/orthostatic hypotension-long trip 6 hours and when he got back was resting and got out of chair and felt unbalanced- no clear vertigo . Happened a few other times with position change but states was hydrating well. Didn't check blood pressure when felt bad.  - If you get any other days where you feel lightheaded like that check blood pressure- I'm tempted to reduce lisinopril further to 5 mg  # Recent shingles-healed from case on left side of his face treated last month with Valtrex and this healed with some ongoing sensitivity if something brushes it   - also later dealt with styes on both eyes after shingles improved  # Atrial fibrillation #Secondary hypercoagulable state S: Rate controlled with  diltiazem 30 mg only as needed for elevated heart rate/palpitations/heart racing -new diagnosis 04/16/22 in ER Anticoagulated with Eliquis 5 mg twice daily  A/P: appropriately  anticoagulated and rate controlled- continue current medicine   #hypertension S: medication: Lisinopril 10 mg once daily, imdur 30 mg Home readings #s: 110s/70s frquently but can get up to 130 in last month A/P: blood pressure well controlled but see notes above about  possibly decreasing if feels lightheaded again with position change like he did yesterday  #CAD- LHC 07/22/2022 showed heavily calcified 85% mid LAD stenosis; medical management recommended and consider PCI worsening symptoms.  #hyperlipidemia  S: Medication:Atorvastatin 20 mg once daily, imdur 30 mg, eliquis 5 mg twice daily  - no chest pain or shortness of breath  Lab Results  Component Value Date   CHOL 110 01/05/2023   HDL 45.60 01/05/2023   LDLCALC 47 01/05/2023   TRIG 84.0 01/05/2023   CHOLHDL 2 01/05/2023  A/P: coronary artery disease asymptomatic continue current medications Lipids looked good last year- update today  #Gout- uric acid has been at goal under 6 -no flares in last year- hold off on uric acid -patient did report he cut down on bacon in 2021 - wife helped to ration this and this seemed to help him  # Aortic dilation/ aneurysm hx- Patient had this previously listed as aortic aneurysm - on repeat ultrasound in 2021 with no measurement over 3 cm - no aneurysm noted 12/30/21- we mentioned doing one more exam perhaps 2-5 years - he prefers to wait until at least next year at least  #Vertigo/BPPV-sparing issues - still has meclizine 25 mg on hand if needed - no recent need  #macular degeneration- close follow up with optho- 2024 on eylea in past- no on vabysmo   # OSA-started June 08, 2023 but not tolerating/using  %Ulcerative colitis- remote issues - patient thinks was misdiagnosed- we planned to continue to monitor for recurrence. Years of remission if this was a real diagnosis  #mild thrombocytopenia- chronic stable, other cell lines ok- trend at least annually including today  Recommended follow up:  Return in about 6 months (around 07/27/2024) for followup or sooner if needed.Schedule b4 you leave. Future Appointments  Date Time Provider Department Center  01/31/2024 10:40 AM Lennette Bihari, MD [facility] None  02/05/2024  1:00 PM [facility] ANNUAL WELLNESS VISIT 1 [facility]   Lab/Order associations:NOT fasting- breakfast at 7 30 so has been nearly 7 hours   ICD-10-CM   1. Preventative health care  Z00.00     2. Ulcerative pancolitis without complication (HCC)  K51.00     3. Abdominal aortic aneurysm (AAA) without rupture, unspecified part (HCC)  I71.40     4. Paroxysmal atrial fibrillation (HCC)  I48.0     5. Secondary hypercoagulable state (HCC)  D68.69     6. Essential hypertension  I10     7. Hyperlipidemia, unspecified hyperlipidemia type  E78.5     8. Screening for prostate cancer  Z12.5     9. Screening for diabetes mellitus  Z13.1     10. Overweight  E66.3       No orders of the defined types were placed in this encounter.   Return precautions advised.  Tana Conch, MD

## 2024-01-26 ENCOUNTER — Encounter: Payer: Self-pay | Admitting: Family Medicine

## 2024-01-26 LAB — CBC WITH DIFFERENTIAL/PLATELET
Basophils Absolute: 0 10*3/uL (ref 0.0–0.1)
Basophils Relative: 0.4 % (ref 0.0–3.0)
Eosinophils Absolute: 0 10*3/uL (ref 0.0–0.7)
Eosinophils Relative: 0.6 % (ref 0.0–5.0)
HCT: 41.8 % (ref 39.0–52.0)
Hemoglobin: 14.1 g/dL (ref 13.0–17.0)
Lymphocytes Relative: 11.8 % — ABNORMAL LOW (ref 12.0–46.0)
Lymphs Abs: 0.6 10*3/uL — ABNORMAL LOW (ref 0.7–4.0)
MCHC: 33.8 g/dL (ref 30.0–36.0)
MCV: 92.9 fl (ref 78.0–100.0)
Monocytes Absolute: 0.8 10*3/uL (ref 0.1–1.0)
Monocytes Relative: 17.3 % — ABNORMAL HIGH (ref 3.0–12.0)
Neutro Abs: 3.4 10*3/uL (ref 1.4–7.7)
Neutrophils Relative %: 69.9 % (ref 43.0–77.0)
Platelets: 122 10*3/uL — ABNORMAL LOW (ref 150.0–400.0)
RBC: 4.5 Mil/uL (ref 4.22–5.81)
RDW: 14.4 % (ref 11.5–15.5)
WBC: 4.8 10*3/uL (ref 4.0–10.5)

## 2024-01-26 LAB — COMPREHENSIVE METABOLIC PANEL
ALT: 10 U/L (ref 0–53)
AST: 14 U/L (ref 0–37)
Albumin: 4.5 g/dL (ref 3.5–5.2)
Alkaline Phosphatase: 68 U/L (ref 39–117)
BUN: 18 mg/dL (ref 6–23)
CO2: 27 meq/L (ref 19–32)
Calcium: 9.1 mg/dL (ref 8.4–10.5)
Chloride: 104 meq/L (ref 96–112)
Creatinine, Ser: 1.08 mg/dL (ref 0.40–1.50)
GFR: 67.51 mL/min (ref >60.00)
Glucose, Bld: 108 mg/dL — ABNORMAL HIGH (ref 70–99)
Potassium: 4.3 meq/L (ref 3.5–5.1)
Sodium: 140 meq/L (ref 135–145)
Total Bilirubin: 1.2 mg/dL (ref 0.2–1.2)
Total Protein: 6.4 g/dL (ref 6.0–8.3)

## 2024-01-26 LAB — PSA, MEDICARE: PSA: 3.31 ng/mL (ref 0.10–4.00)

## 2024-01-26 LAB — LIPID PANEL
Cholesterol: 92 mg/dL (ref 0–200)
HDL: 39.4 mg/dL (ref >39.00)
LDL Cholesterol: 38 mg/dL (ref 0–99)
NonHDL: 52.33
Total CHOL/HDL Ratio: 2
Triglycerides: 70 mg/dL (ref 0.0–149.0)
VLDL: 14 mg/dL (ref 0.0–40.0)

## 2024-01-26 LAB — HEMOGLOBIN A1C: Hgb A1c MFr Bld: 4.9 % (ref 4.6–6.5)

## 2024-01-31 ENCOUNTER — Ambulatory Visit: Payer: PPO | Attending: Cardiovascular Disease | Admitting: Cardiovascular Disease

## 2024-01-31 ENCOUNTER — Encounter: Payer: Self-pay | Admitting: Cardiovascular Disease

## 2024-01-31 DIAGNOSIS — D6869 Other thrombophilia: Secondary | ICD-10-CM

## 2024-01-31 DIAGNOSIS — I2510 Atherosclerotic heart disease of native coronary artery without angina pectoris: Secondary | ICD-10-CM

## 2024-01-31 DIAGNOSIS — G4733 Obstructive sleep apnea (adult) (pediatric): Secondary | ICD-10-CM | POA: Diagnosis not present

## 2024-01-31 DIAGNOSIS — I480 Paroxysmal atrial fibrillation: Secondary | ICD-10-CM | POA: Diagnosis not present

## 2024-01-31 DIAGNOSIS — I10 Essential (primary) hypertension: Secondary | ICD-10-CM | POA: Diagnosis not present

## 2024-01-31 DIAGNOSIS — J342 Deviated nasal septum: Secondary | ICD-10-CM

## 2024-01-31 NOTE — Progress Notes (Signed)
 Cardiology Office Note    Date:  01/31/2024   ID:  Edward Goodwin, DOB 09-23-1951, MRN [id number]  PCP:  Shelva Majestic, MD  Cardiologist:  Nicki Guadalajara, MD (sleep); Dr. Epifanio Lesches  4 month F/U evaluation initially referred for sleep study by Alphonzo Severance, PA .   History of Present Illness:  Edward Goodwin is a 73 y.o. male who is followed by Dr. Tana Conch for primary care.  He has a history of mild hearing loss with hearing aids, and has issues with a deviated nasal septum.  He has a history of CAD with a heavily calcified LAD stenosis on medical management, hypertension, psoriasis, ulcerative colitis and paroxysmal atrial fibrillation.  He was diagnosed with atrial fibrillation at an ER visit on April 16, 2022.  His CHA2DS2-VASc score was 3 and he was started on Eliquis 5 mg twice a day in addition to as needed diltiazem.  He was evaluated by Alphonzo Severance PA in atrial fibrillation clinic and was referred to Dr. Epifanio Lesches for cardiology care.  Due to concerns for obstructive sleep apnea he was referred for a diagnostic polysomnogram which was done on October 11, 2022.  On his home study, his AHI was 9.6/h with RDI 10.6/h consistent with mild overall sleep apnea.  However, during REM sleep, sleep apnea/severe with an AHI of 31.5 and with supine sleep was even more severe with an AHI of 60.6/h.  He dropped his oxygen saturation to a nadir of 79% and he was noted to have loud snoring throughout the study.  He was referred for a CPAP titration trial on December 22, 2022.  The study was aborted midway through due to difficulty with claustrophobia with a fullface mask and he felt he was not getting adequate oxygenation.  He was titrated only to 7 cm of water.  Apparently he has significant issues with deviated septum and felt claustrophobic with his mask on.  He apparently had only tried a fullface mask over the nose.  It was recommended that he initiate CPAP auto  therapy with an EPR of 3 at 7 to 14 cm of water with heated humidification.  He had been contacted by [employer] to contact them to obtain a new machine.  However, he did not want to get a machine until he had an opportunity to see me in sleep clinic for evaluation.  I saw him for my initial sleep evaluation on January 06, 2023.  He typically goes to bed between 8 and 10 PM and wakes up at 6 AM.  States his sleep is not very good.  He has had issues with significant right deviated septum.  He has not been evaluated by an ENT doctor.  Presently, he continues to be on Eliquis for anticoagulation.  He is on isosorbide 15 mg and lisinopril 10 mg daily in addition to as needed diltiazem.  He is on atorvastatin 20 mg for hyperlipidemia.  During his initial consultation and evaluation I had an extensive discussion with him regarding sleep apnea and potential adverse cardiovascular consequences.  During that evaluation he was hesitant to initiate CPAP therapy due to claustrophobia and breathing difficulties.  I recommended ENT evaluation and referred him to Dr. Jenne Pane for possible need for repair of his deviated septum and turbinate reduction.  On Mar 22, 2023, he was evaluated by Dr. Jenne Pane of ENT.  He felt his nasal septum was deviated significantly to the right resulting in narrowing of  the nasal passage.  There was also some degree of nasal valve collapse.  Discussion was made concerning possible septoplasty.  However before considering elective surgery it was recommended by Dr. Jenne Pane that he initiate a trial of CPAP therapy.  If he cannot tolerate treatment, risk was felt to be mitigated for surgery.  If he cannot, he may need sleep endoscopy and consideration of alternative means of treatment.  Edward Goodwin presents to the office today with his wife for further evaluation on May 10, 2023.   An order has been sent to [employer] this week for him to receive his CPAP device.  I had initially recommended CPAP auto therapy with an  EPR of 3 at 7 to 14 cm of water.  He has history of snoring, nonrestorative sleep, frequent nocturnal urination, and difficulty sleeping on his back.    Edward Goodwin received a ResMed air sense 10 AutoSet unit on June 08, 2023 with [employer] as his DME company.  He saw Dr. Jearld Fenton who explained the procedure for his deviated septum.  At present, Edward Goodwin does not feel he wants to pursue this presently.  He has been using CPAP since his update.  His initial compliance data from July 25 through July 07, 2023 confirms that he is meeting compliance standards.  Usage days was 100% and usage greater than 4 hours was 100% with average CPAP use at 6 hours and 55 minutes.  His CPAP pressure is set at a range of 7 to 14 cm.  AHI on that evaluation was one 5.4.  More recent download, September 22 through September 04, 2023 AHI was 4.8 with 0.5 central apnea index and 1.3 obstructive.  He is turned off his ramp since he was starting at a pressure of 4 which was too low.  His most recent download shows his 95th percentile pressure 12.3 with maximal average pressure 12.9.  He is sleeping better with CPAP.  He has had some issues with his fullface mask which he needs due to his significant deviated septum.  He has had issues with mask leak.   Since I last saw him, he decided against undergoing surgery for his deviated septum and had no interest in [place].  He states he had 2 very brief episodes of A-fib over the last 2 years.  He had used CPAP intermittently until mid January 2025.  But has not used CPAP since.  A download from December 14 through November 26, 2023 showed minimal use at only 2 hours and 52 minutes.  At a pressure range of 9 to 15 cm of water, AHI was 3.1 and his 95 percentile pressure was 11.9 with maximum average pressure of 12.6.  Presently he has no interest in continuing CPAP.  He is here with his wife.  He believes he is sleeping adequately.  He continues to be followed by Dr. Bjorn Pippin for cardiology care and  Dr. Durene Cal for primary care.  He is here with his wife and presents for evaluation.  Past Medical History:  Diagnosis Date   Chicken pox    Colitis, ulcerative (HCC)    Hypertension    Macular degeneration, bilateral 07/2012   areds 2    Psoriasis     Past Surgical History:  Procedure Laterality Date   ACNE CYST REMOVAL     CATARACT EXTRACTION W/ INTRAOCULAR LENS IMPLANT  10/2014   bilat   COLONOSCOPY     FOOT SURGERY  1988   LEFT HEART CATH AND CORONARY ANGIOGRAPHY  N/A 07/22/2022   Procedure: LEFT HEART CATH AND CORONARY ANGIOGRAPHY;  Surgeon: Lyn Records, MD;  Location: [facility] INVASIVE CV LAB;  Service: Cardiovascular;  Laterality: N/A;   SHOULDER ARTHROSCOPY  2007   Right   TONSILLECTOMY      Current Medications: Outpatient Medications Prior to Visit  Medication Sig Dispense Refill   acetaminophen (TYLENOL) 650 MG CR tablet Take 650 mg by mouth every 8 (eight) hours as needed for pain.     Ascorbic Acid (VITAMIN C PO) Take 1 tablet by mouth daily. 1000 mg     atorvastatin (LIPITOR) 20 MG tablet TAKE 1 TABLET BY MOUTH EVERY DAY 90 tablet 3   clobetasol ointment (TEMOVATE) 0.05 % Apply 1 Application topically 2 (two) times daily as needed (irritation).     diltiazem (CARDIZEM) 30 MG tablet Take one tablet by mouth every 4 hrs as needed, Heart rate needs to be above 100, top number blood pressure above 100 45 tablet 1   diphenhydramine-acetaminophen (TYLENOL PM) 25-500 MG TABS tablet Take 1 tablet by mouth at bedtime as needed (pain/sleep).     ELIQUIS 5 MG TABS tablet TAKE 1 TABLET BY MOUTH TWICE A DAY 60 tablet 5   faricimab-svoa (VABYSMO) 6 MG/0.05ML SOLN intravitreal injection 6 mg by Intravitreal route once.     gabapentin (NEURONTIN) 100 MG capsule Take 1 capsule (100 mg total) by mouth at bedtime. 90 capsule 3   isosorbide mononitrate (IMDUR) 30 MG 24 hr tablet Take 1 tablet (30 mg total) by mouth daily. 90 tablet 3   L-Lysine 1000 MG TABS Take 1,000 mg by mouth daily.      lisinopril (ZESTRIL) 10 MG tablet Take 1 tablet (10 mg total) by mouth daily. 90 tablet 3   Multiple Vitamins-Minerals (PRESERVISION AREDS 2) CAPS Take 1 capsule by mouth 2 (two) times daily before lunch and supper.     Propylene Glycol (SYSTANE COMPLETE OP) Place 1 drop into both eyes 2 (two) times daily.     nitroGLYCERIN (NITROSTAT) 0.4 MG SL tablet Place 1 tablet (0.4 mg total) under the tongue every 5 (five) minutes as needed. 25 tablet 3   valACYclovir (VALTREX) 1000 MG tablet Take 1 tablet (1,000 mg total) by mouth 3 (three) times daily. For 7 days for shingles. 21 tablet 0   No facility-administered medications prior to visit.     Allergies:   Sulfonamide derivatives   Social History   Socioeconomic History   Marital status: Married    Spouse name: Not on file   Number of children: 1   Years of education: 12   Highest education level: Associate degree: occupational, Scientist, product/process development, or vocational program  Occupational History   Occupation: Systems developer    Comment: retired   Tobacco Use   Smoking status: Former    Current packs/day: 0.00    Types: Cigarettes    Quit date: 10/23/1989    Years since quitting: 34.2   Smokeless tobacco: Never  Vaping Use   Vaping status: Never Used  Substance and Sexual Activity   Alcohol use: Not Currently    Comment: rare glass of wine - less than once a year   Drug use: No   Sexual activity: Not Currently  Other Topics Concern   Not on file  Social History Narrative   Married 1971. 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  [employer] 6 years.       ACP: HCPOA - wife. Yes for acute CPR, no for prolonged mechanical ventilatory support, no for prolonged artificial nutrition or other heroic measures leaving him in an incapacitated state.    Social Drivers of Corporate investment banker Strain: Low Risk  (03/01/2023)    Overall Financial Resource Strain (CARDIA)    Difficulty of Paying Living Expenses: Not hard at all  Food Insecurity: No Food Insecurity (03/01/2023)   Hunger Vital Sign    Worried About Running Out of Food in the Last Year: Never true    Ran Out of Food in the Last Year: Never true  Transportation Needs: No Transportation Needs (03/01/2023)   PRAPARE - Administrator, Civil Service (Medical): No    Lack of Transportation (Non-Medical): No  Physical Activity: Unknown (03/01/2023)   Exercise Vital Sign    Days of Exercise per Week: Patient declined    Minutes of Exercise per Session: 150+ min  Stress: No Stress Concern Present (03/01/2023)   [employer] of Occupational Health - Occupational Stress Questionnaire    Feeling of Stress : Not at all  Social Connections: Socially Integrated (03/01/2023)   Social Connection and Isolation Panel [NHANES]    Frequency of Communication with Friends and Family: More than three times a week    Frequency of Social Gatherings with Friends and Family: More than three times a week    Attends Religious Services: More than 4 times per year    Active Member of [employer] or Organizations: Yes    Attends Engineer, structural: More than 4 times per year    Marital Status: Married     Family History:  The patient's family history includes Arthritis in his paternal grandmother; Breast cancer in his mother; Dementia in his mother; Diabetes in his daughter and maternal uncle; Emphysema in his maternal grandfather; Heart disease in his father and mother; Hepatitis C in his brother; Hypertension in his father and mother; Hypothyroidism in his mother; Stroke in his maternal grandmother and mother.   ROS General: Negative; No fevers, chills, or night sweats;  HEENT: Negative; No changes in vision or hearing, sinus congestion, difficulty swallowing Pulmonary: Negative; No cough, wheezing, shortness of breath, hemoptysis Cardiovascular: Negative; No  chest pain, presyncope, syncope, palpitations GI: Negative; No nausea, vomiting, diarrhea, or abdominal pain GU: Negative; No dysuria, hematuria, or difficulty voiding Musculoskeletal: Negative; no myalgias, joint pain, or weakness Hematologic/Oncology: Negative; no easy bruising, bleeding Endocrine: Negative; no heat/cold intolerance; no diabetes Neuro: Negative; no changes in balance, headaches Skin: Negative; No rashes or skin lesions Psychiatric: Negative; No behavioral problems, depression Sleep: See HPI: Positive for mild overall sleep apnea which is severe during REM sleep in the supine position.  Positive for snoring, an Epworth Sleepiness Scale score was calculated in the office today and this endorsed at 3 arguing against residual daytime sleepiness.   No bruxism, restless legs, hypnogognic hallucinations, no cataplexy Other comprehensive 14 point system review is negative.   PHYSICAL EXAM:   VS:  BP 116/74   Pulse (!) 57   Ht 5' 9.5" (1.765 m)   Wt 199 lb (90.3 kg)   SpO2 96%   BMI 28.97 kg/m     Repeat blood pressure by me was 120/70  Wt Readings from Last 3 Encounters:  01/31/24 199 lb (90.3 kg)  01/25/24 197 lb 9.6 oz (89.6 kg)  12/21/23 198 lb (89.8 kg)   General: Alert,  oriented, no distress.  Skin: normal turgor, no rashes, warm and dry HEENT: Normocephalic, atraumatic. Pupils equal round and reactive to light; sclera anicteric; extraocular muscles intact;  Nose without nasal septal hypertrophy Mouth/Parynx benign; Mallinpatti scale Neck: No JVD, no carotid bruits; normal carotid upstroke Lungs: clear to ausculatation and percussion; no wheezing or rales Chest wall: without tenderness to palpitation Heart: PMI not displaced, RRR, s1 s2 normal, 1/6 systolic murmur, no diastolic murmur, no rubs, gallops, thrills, or heaves Abdomen: soft, nontender; no hepatosplenomehaly, BS+; abdominal aorta nontender and not dilated by palpation. Back: no CVA tenderness Pulses  2+ Musculoskeletal: full range of motion, normal strength, no joint deformities Extremities: no clubbing cyanosis or edema, Homan's sign negative  Neurologic: grossly nonfocal; Cranial nerves grossly wnl Psychologic: Normal mood and affect     Studies/Labs Reviewed:   EKG Interpretation Date/Time:  Wednesday January 31 2024 11:02:35 EDT Ventricular Rate:  57 PR Interval:  168 QRS Duration:  84 QT Interval:  402 QTC Calculation: 391 R Axis:   33  Text Interpretation: Sinus bradycardia with sinus arrhythmia Low voltage QRS When compared with ECG of 08-Dec-2023 08:10, No significant change was found Confirmed by Nicki Guadalajara ([id number]) on 01/31/2024 5:29:34 PM    September 06, 2023 ECG (independently read by me): Normal sinus rhythm at 60.  Low voltage.  January 06, 2023 ECG (independently read by me): NSR at 63, no ectopy  Recent Labs:    Latest Ref Rng & Units 01/25/2024    3:21 PM 02/27/2023    2:58 PM 01/05/2023    8:50 AM  BMP  Glucose 70 - 99 mg/dL 604  93  89   BUN 6 - 23 mg/dL 18  26  16    Creatinine 0.40 - 1.50 mg/dL 5.40  9.81  1.91   Sodium 135 - 145 mEq/L 140  141  142   Potassium 3.5 - 5.1 mEq/L 4.3  4.4  4.1   Chloride 96 - 112 mEq/L 104  106  106   CO2 19 - 32 mEq/L 27  24  30    Calcium 8.4 - 10.5 mg/dL 9.1  9.3  9.3         Latest Ref Rng & Units 01/25/2024    3:21 PM 01/05/2023    8:50 AM 04/16/2022    8:07 AM  Hepatic Function  Total Protein 6.0 - 8.3 g/dL 6.4  6.4  6.8   Albumin 3.5 - 5.2 g/dL 4.5  4.6  4.3   AST 0 - 37 U/L 14  13  12    ALT 0 - 53 U/L 10  10  9    Alk Phosphatase 39 - 117 U/L 68  69  55   Total Bilirubin 0.2 - 1.2 mg/dL 1.2  1.1  0.9        Latest Ref Rng & Units 01/25/2024    3:21 PM 02/27/2023    2:58 PM 01/05/2023    8:50 AM  CBC  WBC 4.0 - 10.5 K/uL 4.8  6.3  4.7   Hemoglobin 13.0 - 17.0 g/dL 47.8  29.5  62.1   Hematocrit 39.0 - 52.0 % 41.8  40.7  42.0   Platelets 150.0 - 400.0 K/uL 122.0  142  137.0    Lab Results  Component  Value Date   MCV 92.9 01/25/2024   MCV 88.7 02/27/2023   MCV 90.4 01/05/2023   Lab Results  Component Value Date   TSH 1.15 06/15/2022   Lab Results  Component Value  Date   HGBA1C 4.9 01/25/2024     BNP    Component Value Date/Time   BNP 402.8 (H) 04/16/2022 0807    ProBNP No results found for: "PROBNP"   Lipid Panel     Component Value Date/Time   CHOL 92 01/25/2024 1521   TRIG 70.0 01/25/2024 1521   TRIG 58 09/01/2006 0749   HDL 39.40 01/25/2024 1521   CHOLHDL 2 01/25/2024 1521   VLDL 14.0 01/25/2024 1521   LDLCALC 38 01/25/2024 1521     RADIOLOGY: No results found.   Additional studies/ records that were reviewed today include:    Patient Name: Edward Goodwin, Edward Goodwin Date: 10/11/2022 Gender: Male D.O.B: 10-06-1951 Age (years): 44 Referring Provider: Alphonzo Severance PA Height (inches): 69 Interpreting Physician: Nicki Guadalajara MD, ABSM Weight (lbs): 195 RPSGT: Rosette Reveal BMI: 29 MRN: [id number] Neck Size: 16.00   CLINICAL INFORMATION Sleep Study Type: NPSG   Indication for sleep study: Hypertension, CAD, PAF, snoring   Epworth Sleepiness Score: 3   SLEEP STUDY TECHNIQUE As per the AASM Manual for the Scoring of Sleep and Associated Events v2.3 (April 2016) with a hypopnea requiring 4% desaturations.   The channels recorded and monitored were frontal, central and occipital EEG, electrooculogram (EOG), submentalis EMG (chin), nasal and oral airflow, thoracic and abdominal wall motion, anterior tibialis EMG, snore microphone, electrocardiogram, and pulse oximetry.   MEDICATIONS acetaminophen (TYLENOL) 650 MG CR tablet apixaban (ELIQUIS) 5 MG TABS tablet Ascorbic Acid (VITAMIN C PO) aspirin EC 81 MG tablet atorvastatin (LIPITOR) 20 MG tablet clobetasol ointment (TEMOVATE) 0.05 % diltiazem (CARDIZEM) 30 MG tablet diphenhydramine-acetaminophen (TYLENOL PM) 25-500 MG TABS tablet faricimab-svoa (VABYSMO) 6 MG/0.05ML SOLN intravitreal  injection isosorbide mononitrate (IMDUR) 30 MG 24 hr tablet L-Lysine 1000 MG TABS lisinopril (ZESTRIL) 20 MG tablet Multiple Vitamins-Minerals (PRESERVISION AREDS 2) CAPS nitroGLYCERIN (NITROSTAT) 0.4 MG SL tablet (Expired) Propylene Glycol (SYSTANE COMPLETE OP) Medications self-administered by patient taken the night of the study : TYLENOL PM   SLEEP ARCHITECTURE The study was initiated at 9:53:22 PM and ended at 4:21:24 AM.   Sleep onset time was 7.8 minutes and the sleep efficiency was 82.0%. The total sleep time was 318.2 minutes.   Stage REM latency was 60.0 minutes.   The patient spent 5.7% of the night in stage N1 sleep, 66.8% in stage N2 sleep, 0.0% in stage N3 and 27.5% in REM.   Alpha intrusion was absent.   Supine sleep was 14.93%.   RESPIRATORY PARAMETERS The overall apnea/hypopnea index (AHI) was 9.6 per hour. The respiratory disturbance index (RDI) was 10.6/h. There were 24 total apneas, including 23 obstructive, 1 central and 0 mixed apneas. There were 27 hypopneas and 5 RERAs.   The AHI during Stage REM sleep was 31.5 per hour.   AHI while supine was 60.6 per hour.   The mean oxygen saturation was 93.3%. The minimum SpO2 during sleep was 79.0%.   Loud snoring was noted during this study.   CARDIAC DATA The 2 lead EKG demonstrated sinus rhythm. The mean heart rate was 50.8 beats per minute. Other EKG findings include: None.   LEG MOVEMENT DATA The total PLMS were 0 with a resulting PLMS index of 0.0. Associated arousal with leg movement index was 0.8 .   IMPRESSIONS - Mild obstructive sleep apnea overall (AHI 9.6/h; RDI 10.6/h): however, sleep apnea was severe during REM sleep (AHI 31.5/h) and very severe with supine sleep (AHI 60.6/h). - No significant central sleep apnea occurred during this study (CAI  0.2/h). - Significant oxygen desaturation to a nadir of 79.0%. - The patient snored with loud snoring volume. - No cardiac abnormalities were noted during  this study. - Clinically significant periodic limb movements did not occur during sleep. No significant associated arousals. - DIAGNOSIS - Obstructive Sleep Apnea (G47.33) - Nocturnal Hypoxemia (G47.36)   RECOMMENDATIONS - Therapeutic CPAP titration to determine optimal pressure required to alleviate sleep disordered breathing. In this patient with cardiovascular comorbidities recommend an in-lab CPAP titration; if unable, then initiate AUto PAP with EPR of 3 at 6 - 18 cm of water. - Effort should be made to otpimize nasal and oropharyngeal patency. - Positional therapy avoiding supine position during sleep. - Avoid alcohol, sedatives and other CNS depressants that may worsen sleep apnea and disrupt normal sleep architecture. - Sleep hygiene should be reviewed to assess factors that may improve sleep quality. - Weight management and regular exercise should be initiated or continued if appropriate. - Recommend a download and sleep clinic evaluation after 4 weeks of therapy.    CPAP TITRATION: 12/22/2022 CLINICAL INFORMATION The patient is referred for a CPAP titration to treat sleep apnea.   Date of NPSG: 10/11/2022:  AHI 9.6/h; RDI 10.6/h; REM AHI 31.5/h; supine AHI 60.6/h; O2 nadir 79%.   SLEEP STUDY TECHNIQUE As per the AASM Manual for the Scoring of Sleep and Associated Events v2.3 (April 2016) with a hypopnea requiring 4% desaturations.   The channels recorded and monitored were frontal, central and occipital EEG, electrooculogram (EOG), submentalis EMG (chin), nasal and oral airflow, thoracic and abdominal wall motion, anterior tibialis EMG, snore microphone, electrocardiogram, and pulse oximetry. Continuous positive airway pressure (CPAP) was initiated at the beginning of the study and titrated to treat sleep-disordered breathing.   MEDICATIONS acetaminophen (TYLENOL) 650 MG CR tablet aflibercept (EYLEA HD) 8 MG/0.07ML SOLN apixaban (ELIQUIS) 5 MG TABS tablet Ascorbic Acid  (VITAMIN C PO) atorvastatin (LIPITOR) 20 MG tablet clobetasol ointment (TEMOVATE) 0.05  diltiazem (CARDIZEM) 30 MG tablet diphenhydramine-acetaminophen (TYLENOL PM) 25-500 MG TABS tablet faricimab-svoa (VABYSMO) 6 MG/0.05ML SOLN intravitreal injection isosorbide mononitrate (IMDUR) 30 MG 24 hr tablet L-Lysine 1000 MG TAB lisinopril (ZESTRIL) 10 MG tablet Multiple Vitamins-Minerals (PRESERVISION AREDS 2) CAP nitroGLYCERIN (NITROSTAT) 0.4 MG SL tablet Propylene Glycol (SYSTANE COMPLETE OP) Medications self-administered by patient taken the night of the study : TYLENOL PM   TECHNICIAN COMMENTS Comments added by technician: Patient had difficulty initiating sleep. Patient could not tolerate CPAP. Study was inconclusive Comments added by scorer: N/A   RESPIRATORY PARAMETERS Optimal PAP Pressure (cm):  7          AHI at Optimal Pressure (/hr):            2.1 Overall Minimal O2 (%):         85.0     Supine % at Optimal Pressure (%):    18 Minimal O2 at Optimal Pressure (%): 85.0        SLEEP ARCHITECTURE The study was initiated at 10:58:18 PM and ended at 3:29:08 AM.   Sleep onset time was 0.5 minutes and the sleep efficiency was 83.3%. The total sleep time was 225.5 minutes.   The patient spent 1.8% of the night in stage N1 sleep, 91.4% in stage N2 sleep, 0.0% in stage N3 and 6.9% in REM.Stage REM latency was 150.0 minutes   Wake after sleep onset was 44.8. Alpha intrusion was absent. Supine sleep was 10.28%.   CARDIAC DATA The 2 lead EKG demonstrated sinus rhythm. The mean heart  rate was 95.1 beats per minute. Other EKG findings include: None.   LEG MOVEMENT DATA The total Periodic Limb Movements of Sleep (PLMS) were 0. The PLMS index was 0.0. A PLMS index of <15 is considered normal in adults.   IMPRESSIONS - CPAP was initiated at 5 cm and was titrated to only 7 cm of water. (AHI 2.1/h; O2 nadir 85%) - Moderate oxygen desaturations were observed during this titration to a nadir of  85.0% at 7 cm. - No snoring was audible during this study. - No cardiac abnormalities were observed during this study. - Clinically significant periodic limb movements were not noted during this study. Arousals associated with PLMs were rare.   DIAGNOSIS - Obstructive Sleep Apnea (G47.33)   RECOMMENDATIONS - Recommend an initial trial of CPAP Auto therapy with EPR of 3 at 7 - 14 cm H2O with heated humidification. A large size Fisher&Paykel Full Face Simplus mask was used for the titration. - Effort should be made to optimize nasal and orophayngeal patency. - Avoid alcohol, sedatives and other CNS depressants that may worsen sleep apnea and disrupt normal sleep architecture. - Sleep hygiene should be reviewed to assess factors that may improve sleep quality. - Weight management and regular exercise should be initiated or continued. - Recommend a download and sleep clinic evaluation after 4 weeks of therapy.    ASSESSMENT:    1. OSA (obstructive sleep apnea)   2. Coronary artery disease involving native coronary artery of native heart without angina pectoris   3. Essential hypertension   4. Paroxysmal atrial fibrillation (HCC)   5. Deviated septum   6. Secondary hypercoagulable state [facility])     PLAN:  Edward Goodwin is a 73 year old gentleman who has a history of CAD, hypertension, and developed an episode of atrial fibrillation leading to an ER  on April 16, 2022.  In addition he has a history of ulcerative colitis, psoriasis, and has been documented to have significant deviated septum.  Following his episode of atrial fibrillation, he was referred for a sleep study by Alphonzo Severance, PA in the atrial fibrillation clinic.   At his initial office visit with me I had an extensive discussion with him and reviewed both his home study as well as CPAP titration.  I extensively reviewed potential adverse cardiovascular consequences of untreated sleep apnea.  He was subsequently referred to Dr. Jenne Pane  for ENT evaluation who concurred that he had severe deviation of his nasal septum rightward with nasal passage narrowing.  It was recommended by Dr. Jenne Pane that he initiate a trial of CPAP therapy before undergoing elective surgery.  I again reviewed his sleep data.  We have placed an order with [employer] who is his DME company for him to receive a ResMed AirSense 11 CPAP auto unit.  He will be set initially an EPR of 3, from pressure range of 7 to 14 cm of water.  At his June 2024 evaluation I again discussed with him the adverse potential cardiovascular consequences of untreated sleep apnea and in particular its effect on blood pressure control, increased risk for nocturnal arrhythmias with increased risk for atrial fibrillation.  I discussed its negative effect on insulin resistance with increased glucose, increased inflammatory markers and increased nocturnal GERD.  I discussed sleeping on the left side would be better for reflux than compared to sleeping on the right side.  With his CAD, I also discussed potential risk of nocturnal hypoxemia contributing to nocturnal ischemia.  He has frequent urination  and I extensively discussed the pathophysiology associated with this.  He received a new ResMed AirSense 10 AutoSet unit on August 09, 2023 with [employer] as his DME company.  He has been sleeping more on his side.  He decided against undergoing surgery for his significant deviated septum.  He was also told by Dr. Jenne Pane that due to the mild nature of his sleep apnea insurance would not most likely cover a potential Inspire device.  However he has no interest in pursuing this.  He is sleeping better on his side but previously was found to have significant severe sleep apnea with supine position with AHI 60.6 and O2 nadir at 79%.  Since he is no longer on CPAP and does not wish to continue therapy I have strongly advised he undergo evaluation for a customized oral appliance which can result in some mandibular  advancement.  I will refer him to the care of Dr. Althea Grimmer for evaluation.  He is aware of my upcoming retirement.  He will return to the cardiology care of Dr. Bjorn Pippin.  In the future if sleep issues arise I discussed with him potential follow-up evaluation with Dr. Mayford Knife if needed.    Medication Adjustments/Labs and Tests Ordered: Current medicines are reviewed at length with the patient today.  Concerns regarding medicines are outlined above.  Medication changes, Labs and Tests ordered today are listed in the Patient Instructions below. Patient Instructions  Medication Instructions:  No medication changes were made during today's visit.  *If you need a refill on your cardiac medications before your next appointment, please call your pharmacy*   Lab Work: No labs were ordered during today's visit.  If you have labs (blood work) drawn today and your tests are completely normal, you will receive your results only by: MyChart Message (if you have MyChart) OR A paper copy in the mail If you have any lab test that is abnormal or we need to change your treatment, we will call you to review the results.   Testing/Procedures: No procedures were ordered during today's visit.    Follow-Up: At [facility], you and your health needs are our priority.  As part of our continuing mission to provide you with exceptional heart care, we have created designated Provider Care Teams.  These Care Teams include your primary Cardiologist (physician) and Advanced Practice Providers (APPs -  Physician Assistants and Nurse Practitioners) who all work together to provide you with the care you need, when you need it.  We recommend signing up for the patient portal called "MyChart".  Sign up information is provided on this After Visit Summary.  MyChart is used to connect with patients for Virtual Visits (Telemedicine).  Patients are able to view lab/test results, encounter notes, upcoming appointments,  etc.  Non-urgent messages can be sent to your provider as well.   To learn more about what you can do with MyChart, go to [email or web address].    [facility]: Sylvan Cheese, DDS,MSD, PA Address: [address] [place], [place] [address]  Phone: [phone]    Other Instructions HEART & VASCULAR CENTER  [address] [place], [place] [address]    OPENING APRIL [phone]       1st Floor: - Lobby - Registration  - Pharmacy  - Lab - Cafe   2nd Floor: - PV Lab - Diagnostic Testing (echo, CT, nuclear med)   3rd Floor: - Vacant   4th Floor: - TCTS (cardiothoracic surgery) - AFib Clinic -  Structural Heart Clinic - Vascular Surgery  - Vascular Ultrasound   5th Floor: - HeartCare Cardiology (general and EP) - Clinical Pharmacy for coumadin, hypertension, lipid, weight-loss medications, and med management appointments      Valet parking services will be available as well.           Signed, Nicki Guadalajara, MD, [facility], ABSM Diplomate, American Board of Sleep Medicine   01/31/2024 5:46 PM    [facility] [address], [address], [place], [place]  [address] Phone: [phone]

## 2024-01-31 NOTE — Patient Instructions (Signed)
 Medication Instructions:  No medication changes were made during today's visit.  *If you need a refill on your cardiac medications before your next appointment, please call your pharmacy*   Lab Work: No labs were ordered during today's visit.  If you have labs (blood work) drawn today and your tests are completely normal, you will receive your results only by: MyChart Message (if you have MyChart) OR A paper copy in the mail If you have any lab test that is abnormal or we need to change your treatment, we will call you to review the results.   Testing/Procedures: No procedures were ordered during today's visit.    Follow-Up: At [facility], you and your health needs are our priority.  As part of our continuing mission to provide you with exceptional heart care, we have created designated Provider Care Teams.  These Care Teams include your primary Cardiologist (physician) and Advanced Practice Providers (APPs -  Physician Assistants and Nurse Practitioners) who all work together to provide you with the care you need, when you need it.  We recommend signing up for the patient portal called "MyChart".  Sign up information is provided on this After Visit Summary.  MyChart is used to connect with patients for Virtual Visits (Telemedicine).  Patients are able to view lab/test results, encounter notes, upcoming appointments, etc.  Non-urgent messages can be sent to your provider as well.   To learn more about what you can do with MyChart, go to [email or web address].    [facility]: Sylvan Cheese, DDS,MSD, PA Address: [address] [place], [place] [address]  Phone: [phone]    Other Instructions HEART & VASCULAR CENTER  [address] [place], [place] [address]    OPENING APRIL [phone]       1st Floor: - Lobby - Registration  - Pharmacy  - Lab - Cafe   2nd Floor: - PV Lab - Diagnostic Testing (echo, CT, nuclear med)   3rd Floor: -  Vacant   4th Floor: - TCTS (cardiothoracic surgery) - AFib Clinic - Structural Heart Clinic - Vascular Surgery  - Vascular Ultrasound   5th Floor: - HeartCare Cardiology (general and EP) - Clinical Pharmacy for coumadin, hypertension, lipid, weight-loss medications, and med management appointments      Valet parking services will be available as well.

## 2024-02-05 ENCOUNTER — Ambulatory Visit (INDEPENDENT_AMBULATORY_CARE_PROVIDER_SITE_OTHER): Payer: PPO

## 2024-02-05 VITALS — Ht 69.5 in | Wt 196.0 lb

## 2024-02-05 DIAGNOSIS — Z0000 Encounter for general adult medical examination without abnormal findings: Secondary | ICD-10-CM | POA: Diagnosis not present

## 2024-02-05 NOTE — Progress Notes (Signed)
 Subjective:   Edward Goodwin is a 73 y.o. who presents for a Medicare Wellness preventive visit.  Visit Complete: Virtual I connected with  Edward Goodwin on 02/05/24 by a audio enabled telemedicine application and verified that I am speaking with the correct person using two identifiers.  Patient Location: Home  Provider Location: Office/Clinic  I discussed the limitations of evaluation and management by telemedicine. The patient expressed understanding and agreed to proceed.  Vital Signs: Because this visit was a virtual/telehealth visit, some criteria may be missing or patient reported. Any vitals not documented were not able to be obtained and vitals that have been documented are patient reported.  VideoDeclined- This patient declined Librarian, academic. Therefore the visit was completed with audio only.  Persons Participating in Visit: Patient.  AWV Questionnaire: No: Patient Medicare AWV questionnaire was not completed prior to this visit.  Cardiac Risk Factors include: advanced age (>53men, >46 women);dyslipidemia;hypertension     Objective:    Today's Vitals   02/05/24 1317  Weight: 196 lb (88.9 kg)  Height: 5' 9.5" (1.765 m)   Body mass index is 28.53 kg/m.     02/05/2024    1:21 PM 02/27/2023    2:59 PM 01/31/2023    2:34 PM 10/11/2022    8:18 PM 07/22/2022    7:47 AM 04/16/2022    8:15 AM 02/16/2022    8:55 AM  Advanced Directives  Does Patient Have a Medical Advance Directive? Yes Yes Yes Yes Yes Yes Yes  Type of Estate agent of [place];Living will Living will Healthcare Power of [place];Living will Living will;Healthcare Power of [employer] Power of [place];Living will  Healthcare Power of [place];Living will  Does patient want to make changes to medical advance directive? No - Patient declined  No - Patient declined No - Patient declined No - Patient declined No - Patient declined No - Patient declined   Copy of Healthcare Power of Attorney in Chart? Yes - validated most recent copy scanned in chart (See row information)  Yes - validated most recent copy scanned in chart (See row information)  No - copy requested  Yes - validated most recent copy scanned in chart (See row information)    Current Medications (verified) Outpatient Encounter Medications as of 02/05/2024  Medication Sig   acetaminophen (TYLENOL) 650 MG CR tablet Take 650 mg by mouth every 8 (eight) hours as needed for pain.   Ascorbic Acid (VITAMIN C PO) Take 1 tablet by mouth daily. 1000 mg   atorvastatin (LIPITOR) 20 MG tablet TAKE 1 TABLET BY MOUTH EVERY DAY   clobetasol ointment (TEMOVATE) 0.05 % Apply 1 Application topically 2 (two) times daily as needed (irritation).   COMIRNATY syringe    diltiazem (CARDIZEM) 30 MG tablet Take one tablet by mouth every 4 hrs as needed, Heart rate needs to be above 100, top number blood pressure above 100   diphenhydramine-acetaminophen (TYLENOL PM) 25-500 MG TABS tablet Take 1 tablet by mouth at bedtime as needed (pain/sleep).   ELIQUIS 5 MG TABS tablet TAKE 1 TABLET BY MOUTH TWICE A DAY   faricimab-svoa (VABYSMO) 6 MG/0.05ML SOLN intravitreal injection 6 mg by Intravitreal route once.   gabapentin (NEURONTIN) 100 MG capsule Take 1 capsule (100 mg total) by mouth at bedtime.   isosorbide mononitrate (IMDUR) 30 MG 24 hr tablet Take 1 tablet (30 mg total) by mouth daily.   L-Lysine 1000 MG TABS Take 1,000 mg by mouth daily.  lisinopril (ZESTRIL) 10 MG tablet Take 1 tablet (10 mg total) by mouth daily.   Multiple Vitamins-Minerals (PRESERVISION AREDS 2) CAPS Take 1 capsule by mouth 2 (two) times daily before lunch and supper.   Propylene Glycol (SYSTANE COMPLETE OP) Place 1 drop into both eyes 2 (two) times daily.   nitroGLYCERIN (NITROSTAT) 0.4 MG SL tablet Place 1 tablet (0.4 mg total) under the tongue every 5 (five) minutes as needed.   No facility-administered encounter medications on  file as of 02/05/2024.    Allergies (verified) Sulfonamide derivatives   History: Past Medical History:  Diagnosis Date   Chicken pox    Colitis, ulcerative (HCC)    Hypertension    Macular degeneration, bilateral 07/2012   areds 2    Psoriasis    Past Surgical History:  Procedure Laterality Date   ACNE CYST REMOVAL     CATARACT EXTRACTION W/ INTRAOCULAR LENS IMPLANT  10/2014   bilat   COLONOSCOPY     FOOT SURGERY  1988   LEFT HEART CATH AND CORONARY ANGIOGRAPHY N/A 07/22/2022   Procedure: LEFT HEART CATH AND CORONARY ANGIOGRAPHY;  Surgeon: Lyn Records, MD;  Location: [facility] INVASIVE CV LAB;  Service: Cardiovascular;  Laterality: N/A;   SHOULDER ARTHROSCOPY  2007   Right   TONSILLECTOMY     Family History  Problem Relation Age of Onset   Heart disease Mother    Breast cancer Mother    Hypertension Mother    Stroke Mother    Hypothyroidism Mother    Dementia Mother    Heart disease Father        CAD/MI 30s or 41s smoker. led to death age 75.    Hypertension Father    Diabetes Daughter    Diabetes Maternal Uncle    Hepatitis C Brother    Stroke Maternal Grandmother    Emphysema Maternal Grandfather    Arthritis Paternal Grandmother    Colon cancer Neg Hx    Esophageal cancer Neg Hx    Stomach cancer Neg Hx    Rectal cancer Neg Hx    Social History   Socioeconomic History   Marital status: Married    Spouse name: Not on file   Number of children: 1   Years of education: 12   Highest education level: Associate degree: occupational, Scientist, product/process development, or vocational program  Occupational History   Occupation: Systems developer    Comment: retired   Tobacco Use   Smoking status: Former    Current packs/day: 0.00    Types: Cigarettes    Quit date: 10/23/1989    Years since quitting: 34.3   Smokeless tobacco: Never  Vaping Use   Vaping status: Never Used  Substance and Sexual Activity   Alcohol use: Not Currently    Comment: rare glass of wine - less than once a year    Drug use: No   Sexual activity: Not Currently  Other Topics Concern   Not on file  Social History Narrative   Married 1971. 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 [employer] 6 years.       ACP: HCPOA - wife. Yes for acute CPR, no for prolonged mechanical ventilatory support, no for prolonged artificial nutrition or other heroic measures leaving him in an incapacitated state.    Social Drivers of Corporate investment banker Strain: Low Risk  (  02/05/2024)   Overall Financial Resource Strain (CARDIA)    Difficulty of Paying Living Expenses: Not hard at all  Food Insecurity: No Food Insecurity (02/05/2024)   Hunger Vital Sign    Worried About Running Out of Food in the Last Year: Never true    Ran Out of Food in the Last Year: Never true  Transportation Needs: No Transportation Needs (02/05/2024)   PRAPARE - Administrator, Civil Service (Medical): No    Lack of Transportation (Non-Medical): No  Physical Activity: Inactive (02/05/2024)   Exercise Vital Sign    Days of Exercise per Week: 0 days    Minutes of Exercise per Session: 0 min  Stress: No Stress Concern Present (02/05/2024)   [employer] of Occupational Health - Occupational Stress Questionnaire    Feeling of Stress : Not at all  Social Connections: Moderately Integrated (02/05/2024)   Social Connection and Isolation Panel [NHANES]    Frequency of Communication with Friends and Family: More than three times a week    Frequency of Social Gatherings with Friends and Family: More than three times a week    Attends Religious Services: More than 4 times per year    Active Member of [employer] or Organizations: No    Attends Engineer, structural: Never    Marital Status: Married    Tobacco Counseling Counseling given: Not Answered    Clinical Intake:  Pre-visit preparation completed:  Yes  Pain : No/denies pain     BMI - recorded: 28.53 Nutritional Status: BMI 25 -29 Overweight Nutritional Risks: None Diabetes: No  Lab Results  Component Value Date   HGBA1C 4.9 01/25/2024   HGBA1C 5.0 09/05/2013     How often do you need to have someone help you when you read instructions, pamphlets, or other written materials from your doctor or pharmacy?: 1 - Never  Interpreter Needed?: No  Information entered by :: Lanier Ensign, LPN   Activities of Daily Living     02/05/2024    1:19 PM  In your present state of health, do you have any difficulty performing the following activities:  Hearing? 1  Comment pt has hearing aids  Vision? 0  Difficulty concentrating or making decisions? 0  Walking or climbing stairs? 0  Dressing or bathing? 0  Doing errands, shopping? 0  Preparing Food and eating ? N  Using the Toilet? N  In the past six months, have you accidently leaked urine? N  Do you have problems with loss of bowel control? N  Managing your Medications? N  Managing your Finances? N  Housekeeping or managing your Housekeeping? N    Patient Care Team: Shelva Majestic, MD as PCP - General (Family Medicine) Lennette Bihari, MD as PCP - Sleep Medicine (Cardiology) Little Ishikawa, MD as PCP - Cardiology (Cardiology) [place], Heather, OD as Consulting Physician (Optometry) Aris Lot, MD as Consulting Physician (Dermatology) Maeola Sarah, MD as Consulting Physician (Ophthalmology) Dahlia Byes, [facility] as Pharmacist (Pharmacist)  Indicate any recent Medical Services you may have received from other than [facility] providers in the past year (date may be approximate).     Assessment:   This is a routine wellness examination for Josejuan.  Hearing/Vision screen Hearing Screening - Comments:: Pt has hearing aids Vision Screening - Comments:: Pt follows upwith Dr Heather Burundi for annual eye exam   Goals Addressed             This Visit's Progress  Patient Stated       Maintain health and activity        Depression Screen     02/05/2024    1:21 PM 01/31/2023    2:35 PM 01/05/2023    7:57 AM 06/15/2022    8:57 AM 01/27/2022    2:27 PM 06/10/2021    8:43 AM 01/11/2021    9:43 AM  PHQ 2/9 Scores  PHQ - 2 Score 0 0 0 0 0 0 0  PHQ- 9 Score 0 0 1 0       Fall Risk     02/05/2024    1:23 PM 01/31/2023    2:35 PM 01/05/2023    7:56 AM 06/15/2022    8:57 AM 04/29/2022    9:51 AM  Fall Risk   Falls in the past year? 0 1 1 0 0  Number falls in past yr: 0 1 0 0 0  Injury with Fall? 0 1 1 0 0  Risk for fall due to : No Fall Risks History of fall(s);Impaired vision History of fall(s) No Fall Risks No Fall Risks  Follow up Falls prevention discussed  Falls evaluation completed Falls evaluation completed Falls evaluation completed    MEDICARE RISK AT HOME:  Medicare Risk at Home Any stairs in or around the home?: Yes If so, are there any without handrails?: No Home free of loose throw rugs in walkways, pet beds, electrical cords, etc?: Yes Adequate lighting in your home to reduce risk of falls?: Yes Life alert?: No Use of a cane, walker or w/c?: No Grab bars in the bathroom?: No Shower chair or bench in shower?: No Elevated toilet seat or a handicapped toilet?: No  TIMED UP AND GO:  Was the test performed?  No  Cognitive Function: 6CIT completed    12/05/2018    1:48 PM  MMSE - Mini Mental State Exam  Orientation to time 4  Orientation to Place 5  Registration 3  Attention/ Calculation 5  Recall 3  Language- name 2 objects 2  Language- repeat 1  Language- follow 3 step command 3  Language- read & follow direction 1  Write a sentence 1  Copy design 1  Total score 29        02/05/2024    1:24 PM 01/31/2023    2:37 PM 01/27/2022    2:30 PM 01/11/2021    9:48 AM 12/09/2019    8:58 AM  6CIT Screen  What Year? 0 points 0 points 0 points 0 points 0 points  What month? 0 points 0 points 0 points 0 points 0 points  What  time? 0 points 0 points 0 points  0 points  Count back from 20 0 points 0 points 0 points 0 points 0 points  Months in reverse 0 points 0 points 0 points 0 points 0 points  Repeat phrase 0 points 0 points 0 points 0 points 0 points  Total Score 0 points 0 points 0 points  0 points    Immunizations Immunization History  Administered Date(s) Administered   Fluad Quad(high Dose 65+) 08/27/2019, 09/19/2020, 08/17/2022   Fluad Trivalent(High Dose 65+) 08/16/2023   Influenza Split 10/03/2011   Influenza Whole 08/29/2012   Influenza, High Dose Seasonal PF 08/17/2015, 08/17/2015, 09/01/2016, 08/22/2018, 08/27/2021   Influenza,inj,Quad PF,6+ Mos 09/05/2013, 09/08/2014, 09/19/2020   Influenza-Unspecified 08/17/2017, 08/28/2018   Moderna Covid-19 Fall Seasonal Vaccine 69yrs & older 11/01/2023   PFIZER Comirnaty(Gray Top)Covid-19 Tri-Sucrose Vaccine 05/28/2021, 10/01/2022   PFIZER(Purple Top)SARS-COV-2  Vaccination 01/07/2020, 01/28/2020, 08/25/2020   PNEUMOCOCCAL CONJUGATE-20 04/28/2021   Pfizer Covid-19 Vaccine Bivalent Booster 59yrs & up 10/16/2021   Pfizer(Comirnaty)Fall Seasonal Vaccine 12 years and older 10/01/2022   Pneumococcal Conjugate-13 10/08/2013   Pneumococcal Polysaccharide-23 10/02/2009, 02/04/2015   Td 10/02/2009   Tdap 11/26/2019   Zoster Recombinant(Shingrix) 12/28/2018, 10/05/2019   Zoster, Live 12/04/2009    Screening Tests Health Maintenance  Topic Date Due   COVID-19 Vaccine (8 - 2024-25 season) 05/01/2024   Medicare Annual Wellness (AWV)  02/04/2025   Colonoscopy  05/21/2025   DTaP/Tdap/Td (3 - Td or Tdap) 11/25/2029   Pneumonia Vaccine 63+ Years old  Completed   INFLUENZA VACCINE  Completed   Hepatitis C Screening  Completed   Zoster Vaccines- Shingrix  Completed   HPV VACCINES  Aged Out    Health Maintenance  There are no preventive care reminders to display for this patient.  Health Maintenance Items Addressed: See Nurse Notes  Additional  Screening:  Vision Screening: Recommended annual ophthalmology exams for early detection of glaucoma and other disorders of the eye.  Dental Screening: Recommended annual dental exams for proper oral hygiene  Community Resource Referral / Chronic Care Management: CRR required this visit?  No   CCM required this visit?  No     Plan:     I have personally reviewed and noted the following in the patient's chart:   Medical and social history Use of alcohol, tobacco or illicit drugs  Current medications and supplements including opioid prescriptions. Patient is not currently taking opioid prescriptions. Functional ability and status Nutritional status Physical activity Advanced directives List of other physicians Hospitalizations, surgeries, and ER visits in previous 12 months Vitals Screenings to include cognitive, depression, and falls Referrals and appointments  In addition, I have reviewed and discussed with patient certain preventive protocols, quality metrics, and best practice recommendations. A written personalized care plan for preventive services as well as general preventive health recommendations were provided to patient.     Marzella Schlein, LPN   02/20/8118   After Visit Summary: (MyChart) Due to this being a telephonic visit, the after visit summary with patients personalized plan was offered to patient via MyChart   Notes: Nothing significant to report at this time.

## 2024-02-05 NOTE — Patient Instructions (Signed)
 Edward Goodwin , Thank you for taking time to come for your Medicare Wellness Visit. I appreciate your ongoing commitment to your health goals. Please review the following plan we discussed and let me know if I can assist you in the future.   Referrals/Orders/Follow-Ups/Clinician Recommendations: Aim for 30 minutes of exercise or brisk walking, 6-8 glasses of water, and 5 servings of fruits and vegetables each day.   This is a list of the screening recommended for you and due dates:  Health Maintenance  Topic Date Due   COVID-19 Vaccine (8 - 2024-25 season) 05/01/2024   Medicare Annual Wellness Visit  02/04/2025   Colon Cancer Screening  05/21/2025   DTaP/Tdap/Td vaccine (3 - Td or Tdap) 11/25/2029   Pneumonia Vaccine  Completed   Flu Shot  Completed   Hepatitis C Screening  Completed   Zoster (Shingles) Vaccine  Completed   HPV Vaccine  Aged Out    Advanced directives: (In Chart) A copy of your advanced directives are scanned into your chart should your provider ever need it.  Next Medicare Annual Wellness Visit scheduled for next year: Yes

## 2024-02-06 DIAGNOSIS — G4733 Obstructive sleep apnea (adult) (pediatric): Secondary | ICD-10-CM | POA: Diagnosis not present

## 2024-02-10 DIAGNOSIS — S61211A Laceration without foreign body of left index finger without damage to nail, initial encounter: Secondary | ICD-10-CM | POA: Diagnosis not present

## 2024-02-14 ENCOUNTER — Other Ambulatory Visit: Payer: Self-pay | Admitting: Family Medicine

## 2024-02-16 DIAGNOSIS — H353211 Exudative age-related macular degeneration, right eye, with active choroidal neovascularization: Secondary | ICD-10-CM | POA: Diagnosis not present

## 2024-02-20 ENCOUNTER — Telehealth: Payer: Self-pay | Admitting: Cardiovascular Disease

## 2024-02-20 NOTE — Telephone Encounter (Signed)
 Dr Tresa Endo referred this pt to Dr Myrtis Ser office on 01/31/24. Pt is coming in at 8:20 in the morning and they have not received referral and they definitely need the sleep study sent over for appt They are have trouble with their fax and want [place] to email it to [email or web address]

## 2024-02-22 NOTE — Telephone Encounter (Signed)
 Spoke with Fatima Blank at Dr. Henrietta Hoover office. She confirmed that they received all the records that they need. She denies additional needs at this time.

## 2024-03-01 ENCOUNTER — Other Ambulatory Visit: Payer: Self-pay | Admitting: Cardiology

## 2024-03-08 ENCOUNTER — Other Ambulatory Visit: Payer: Self-pay | Admitting: Physician Assistant

## 2024-03-08 DIAGNOSIS — G4733 Obstructive sleep apnea (adult) (pediatric): Secondary | ICD-10-CM | POA: Diagnosis not present

## 2024-03-18 ENCOUNTER — Other Ambulatory Visit: Payer: Self-pay | Admitting: Cardiology

## 2024-03-18 DIAGNOSIS — I480 Paroxysmal atrial fibrillation: Secondary | ICD-10-CM

## 2024-03-18 NOTE — Telephone Encounter (Signed)
 Prescription refill request for Eliquis  received. Indication: PAF Last office visit: 12/08/23  Twila Gale MD Scr: 1.08 on 01/25/24  Epic Age: 73 Weight: 89.8kg  Based on above findings Eliquis  5mg  twice daily is the appropriate dose.  Refill approved.

## 2024-03-19 DIAGNOSIS — H353211 Exudative age-related macular degeneration, right eye, with active choroidal neovascularization: Secondary | ICD-10-CM | POA: Diagnosis not present

## 2024-04-16 DIAGNOSIS — H353211 Exudative age-related macular degeneration, right eye, with active choroidal neovascularization: Secondary | ICD-10-CM | POA: Diagnosis not present

## 2024-04-22 DIAGNOSIS — L814 Other melanin hyperpigmentation: Secondary | ICD-10-CM | POA: Diagnosis not present

## 2024-04-22 DIAGNOSIS — L570 Actinic keratosis: Secondary | ICD-10-CM | POA: Diagnosis not present

## 2024-04-22 DIAGNOSIS — D1801 Hemangioma of skin and subcutaneous tissue: Secondary | ICD-10-CM | POA: Diagnosis not present

## 2024-04-22 DIAGNOSIS — L400 Psoriasis vulgaris: Secondary | ICD-10-CM | POA: Diagnosis not present

## 2024-04-22 DIAGNOSIS — L821 Other seborrheic keratosis: Secondary | ICD-10-CM | POA: Diagnosis not present

## 2024-05-14 ENCOUNTER — Ambulatory Visit: Payer: Self-pay | Admitting: *Deleted

## 2024-05-14 DIAGNOSIS — H353122 Nonexudative age-related macular degeneration, left eye, intermediate dry stage: Secondary | ICD-10-CM | POA: Diagnosis not present

## 2024-05-14 DIAGNOSIS — H35033 Hypertensive retinopathy, bilateral: Secondary | ICD-10-CM | POA: Diagnosis not present

## 2024-05-14 DIAGNOSIS — H43813 Vitreous degeneration, bilateral: Secondary | ICD-10-CM | POA: Diagnosis not present

## 2024-05-14 DIAGNOSIS — H353211 Exudative age-related macular degeneration, right eye, with active choroidal neovascularization: Secondary | ICD-10-CM | POA: Diagnosis not present

## 2024-05-14 NOTE — Telephone Encounter (Signed)
 Noted,

## 2024-05-14 NOTE — Telephone Encounter (Signed)
 Copied from CRM [id number]. Topic: Clinical - Red Word Triage >> May 14, 2024  8:42 AM Robinson H wrote: [facility] that prompted transfer to Nurse Triage: Really bad back pain, lower back to left side Reason for Disposition  [1] MODERATE back pain (e.g., interferes with normal activities) AND [2] present > 3 days  Answer Assessment - Initial Assessment Questions 1. ONSET: When did the pain begin?      1 week ago  2. LOCATION: Where does it hurt? (upper, mid or lower back)     Left low back  3. SEVERITY: How bad is the pain?  (e.g., Scale 1-10; mild, moderate, or severe)   - MILD (1-3): Doesn't interfere with normal activities.    - MODERATE (4-7): Interferes with normal activities or awakens from sleep.    - SEVERE (8-10): Excruciating pain, unable to do any normal activities.      Sharp pains intermittent. Left low back to middle. Moves certain way pain noted 4. PATTERN: Is the pain constant? (e.g., yes, no; constant, intermittent)      intermittent 5. RADIATION: Does the pain shoot into your legs or somewhere else?     Middle back 6. CAUSE:  What do you think is causing the back pain?      Bent over lighting pilot light  7. BACK OVERUSE:  Any recent lifting of heavy objects, strenuous work or exercise?     na 8. MEDICINES: What have you taken so far for the pain? (e.g., nothing, acetaminophen , NSAIDS)     Pain patch OTC and heat  9. NEUROLOGIC SYMPTOMS: Do you have any weakness, numbness, or problems with bowel/bladder control?     no 10. OTHER SYMPTOMS: Do you have any other symptoms? (e.g., fever, abdomen pain, burning with urination, blood in urine)       Left low back pain, comes and goes with certain movements 11. PREGNANCY: Is there any chance you are pregnant? When was your last menstrual period?       Na   No available appt with PCP scheduled with other provider tomorrow.  Protocols used: Back Pain-A-AH

## 2024-05-15 ENCOUNTER — Ambulatory Visit (INDEPENDENT_AMBULATORY_CARE_PROVIDER_SITE_OTHER): Admitting: Family Medicine

## 2024-05-15 ENCOUNTER — Encounter: Payer: Self-pay | Admitting: Family Medicine

## 2024-05-15 VITALS — BP 120/60 | HR 60 | Temp 97.9°F | Ht 69.5 in | Wt 197.1 lb

## 2024-05-15 DIAGNOSIS — I2510 Atherosclerotic heart disease of native coronary artery without angina pectoris: Secondary | ICD-10-CM

## 2024-05-15 DIAGNOSIS — I10 Essential (primary) hypertension: Secondary | ICD-10-CM | POA: Diagnosis not present

## 2024-05-15 DIAGNOSIS — M545 Low back pain, unspecified: Secondary | ICD-10-CM

## 2024-05-15 DIAGNOSIS — I480 Paroxysmal atrial fibrillation: Secondary | ICD-10-CM

## 2024-05-15 MED ORDER — BACLOFEN 5 MG PO TABS: 5.0000 mg | ORAL_TABLET | Freq: Three times a day (TID) | ORAL | 0 refills | Status: DC | PRN

## 2024-05-15 MED ORDER — PREDNISONE 20 MG PO TABS
20.0000 mg | ORAL_TABLET | Freq: Every day | ORAL | 0 refills | Status: DC
Start: 2024-05-15 — End: 2024-07-29

## 2024-05-15 NOTE — Patient Instructions (Signed)
 It was very nice to see you today!  You in your back.  Work on the exercises.  Start the prednisone  and baclofen.  Let me know or let Dr. Katrinka know if not improving in the next 1 to 2 weeks.  Return if symptoms worsen or fail to improve.   Take care, Dr Kennyth  PLEASE NOTE:  If you had any lab tests, please let us  know if you have not heard back within a few days. You may see your results on mychart before we have a chance to review them but we will give you a call once they are reviewed by us .   If we ordered any referrals today, please let us  know if you have not heard from their office within the next week.   If you had any urgent prescriptions sent in today, please check with the pharmacy within an hour of our visit to make sure the prescription was transmitted appropriately.   Please try these tips to maintain a healthy lifestyle:  Eat at least 3 REAL meals and 1-2 snacks per day.  Aim for no more than 5 hours between eating.  If you eat breakfast, please do so within one hour of getting up.   Each meal should contain half fruits/vegetables, one quarter protein, and one quarter carbs (no bigger than a computer mouse)  Cut down on sweet beverages. This includes juice, soda, and sweet tea.   Drink at least 1 glass of water with each meal and aim for at least 8 glasses per day  Exercise at least 150 minutes every week.

## 2024-05-15 NOTE — Progress Notes (Signed)
   Edward Goodwin is a 73 y.o. male who presents today for an office visit.  Assessment/Plan:  New/Acute Problems: Low Back Pain  No red flags.  Exam consistent with muscular strain.  Cannot do NSAIDs due to being anticoagulated on Eliquis  for paroxysmal A-fib and coronary artery disease.  Will start low-dose prednisone  20 mg daily for 7 days.  Also start baclofen 5 mg 3 times daily as needed.  We discussed potential side effects.  We also discussed home exercise program and handout was given.  He will let us  know if not improving in the next 1 to 2 weeks.  We discussed reasons to return to care.  PAF/coronary artery disease Anticoagulant on Eliquis -we need to avoid NSAIDs  Essential hypertension At goal today on lisinopril  10 mg daily and Imdur  30 mg daily    Subjective:  HPI:  See assessment / plan for status of chronic conditions. Patient here with back pain for 2 weeks.  He thinks he flared it up while working on his hot water heater a few weeks ago. While leaning over he felt something pop in his lower back. He did ok with this initially but seems to be worsening the last few days. Worse with certain motions such as getting out of the char. Pain predominantly located in left lower back but is starting to radiate into right lower back.  Pain does not radiate into his legs.  No reported bowel or bladder incontinence.  No reported urinary retention.  Tried tylenol  without much improvement. Heating pad and topical patches may have given modest benefit.        Objective:  Physical Exam: BP 120/60 (BP Location: Right Arm, Patient Position: Sitting, Cuff Size: Normal)   Pulse 60   Temp 97.9 F (36.6 C) (Temporal)   Ht 5' 9.5 (1.765 m)   Wt 197 lb 2 oz (89.4 kg)   SpO2 96%   BMI 28.69 kg/m   Gen: No acute distress, resting comfortably MUSCULOSKELETAL - Back: No deformities.  Mild tenderness palpation along left lower lumbar paraspinal muscles.  No midline tenderness - Legs: No  deformities.  Full range of motion throughout.  Strength intact throughout.  Sensation light touch intact throughout. Neuro: Grossly normal, moves all extremities Psych: Normal affect and thought content      Cillian Gwinner M. Kennyth, MD 05/15/2024 8:39 AM

## 2024-06-09 NOTE — Progress Notes (Unsigned)
 Cardiology Office Note:    Date:  06/13/2024   ID:  Edward Goodwin, DOB May 31, 1951, MRN [id number]  PCP:  Katrinka Garnette KIDD, MD  Cardiologist:  Lonni LITTIE Nanas, MD  Electrophysiologist:  None   Referring MD: Katrinka Garnette KIDD, MD   Chief Complaint  Patient presents with   Coronary Artery Disease    History of Present Illness:    Edward Goodwin is a 73 y.o. male with a hx of CAD, paroxysmal atrial fibrillation, ulcerative colitis, hypertension, psoriasis who presents for follow-up.  He was referred by Quita Kicks, PA for evaluation of atrial fibrillation.  He was diagnosed with A-fib at ED visit 04/16/2022.  CHA2DS2-VASc score 3, was started on Eliquis  5 mg twice daily as well as diltiazem  30 mg as needed for palpitations/tachycardia.  He reports that he was dehydrated when he went into A-fib.  He felt tired but did not note any palpitations.  His wife checked his pulse and noted it was fast and irregular.  He reports occasional lightheadedness but denies any syncope.  Does report he has occasional chest pain.  Describes tightness in center of his chest.  Has not noted relationship with exertion.  He plays golf twice per week for exercise.  No lower extremity edema.  He is taking Eliquis , denies any bleeding issues.  Smoked for 20 years, quit in his early 63s.  Thinks father had MI in 19s.  Echocardiogram 05/02/2022 showed normal biventricular function, no significant valvular disease.  Coronary CTA on 07/11/2022 showed severe stenosis in mid to distal LAD (CT FFR 0.64), calcium  score 610 (72nd percentile).  LHC 07/22/2022 showed heavily calcified 85% mid LAD stenosis; medical management recommended and consider PCI worsening symptoms.  Since last clinic visit, he reports he is doing OK.  Does report occasional chest pain, has used nitroglycerin  on occasion.  Reports pain is not related to exertion.  Denies any shortness of breath.  Could not tolerate CPAP.  Denies any bleeding on Eliquis .   Denies any recent palpitations.   Past Medical History:  Diagnosis Date   Allergy    Cataract 10-21-14 and 10-28-14   Had cataract surgery   Chicken pox    Colitis, ulcerative (HCC)    Hypertension    Macular degeneration, bilateral 07/2012   areds 2    Psoriasis     Past Surgical History:  Procedure Laterality Date   ACNE CYST REMOVAL     CATARACT EXTRACTION W/ INTRAOCULAR LENS IMPLANT  10/14/2014   bilat   COLONOSCOPY     EYE SURGERY     FOOT SURGERY  11/14/1986   LEFT HEART CATH AND CORONARY ANGIOGRAPHY N/A 07/22/2022   Procedure: LEFT HEART CATH AND CORONARY ANGIOGRAPHY;  Surgeon: Claudene Victory ORN, MD;  Location: [facility];  Service: Cardiovascular;  Laterality: N/A;   SHOULDER ARTHROSCOPY  11/14/2005   Right   TONSILLECTOMY      Current Medications: Current Meds  Medication Sig   acetaminophen  (TYLENOL ) 650 MG CR tablet Take 650 mg by mouth every 8 (eight) hours as needed for pain.   Ascorbic Acid (VITAMIN C PO) Take 1 tablet by mouth daily. 1000 mg   atorvastatin  (LIPITOR) 20 MG tablet TAKE 1 TABLET BY MOUTH EVERY DAY   clobetasol  ointment (TEMOVATE ) 0.05 % Apply 1 Application topically 2 (two) times daily as needed (irritation).   diphenhydramine-acetaminophen  (TYLENOL  PM) 25-500 MG TABS tablet Take 1 tablet by mouth at bedtime as needed (pain/sleep).   ELIQUIS  5  MG TABS tablet TAKE 1 TABLET BY MOUTH TWICE A DAY   faricimab -svoa (VABYSMO ) 6 MG/0.05ML SOLN intravitreal injection 6 mg by Intravitreal route once.   isosorbide  mononitrate (IMDUR ) 30 MG 24 hr tablet TAKE 1 TABLET BY MOUTH EVERY DAY   L-Lysine 1000 MG TABS Take 1,000 mg by mouth daily.   lisinopril  (ZESTRIL ) 10 MG tablet Take 1 tablet (10 mg total) by mouth daily.   Multiple Vitamins-Minerals (PRESERVISION AREDS 2) CAPS Take 1 capsule by mouth 2 (two) times daily before lunch and supper.   Propylene Glycol (SYSTANE COMPLETE OP) Place 1 drop into both eyes 2 (two) times daily.     Allergies:    Sulfonamide derivatives   Social History   Socioeconomic History   Marital status: Married    Spouse name: Not on file   Number of children: 1   Years of education: 12   Highest education level: Associate degree: academic program  Occupational History   Occupation: Systems developer    Comment: retired   Tobacco Use   Smoking status: Former    Current packs/day: 0.00    Types: Cigarettes    Quit date: 10/23/1989    Years since quitting: 34.6   Smokeless tobacco: Never  Vaping Use   Vaping status: Never Used  Substance and Sexual Activity   Alcohol use: Not Currently    Comment: rare glass of wine - less than once a year   Drug use: No   Sexual activity: Not Currently  Other Topics Concern   Not on file  Social History Narrative   Married 1971. 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 [employer] 6 years.       ACP: HCPOA - wife. Yes for acute CPR, no for prolonged mechanical ventilatory support, no for prolonged artificial nutrition or other heroic measures leaving him in an incapacitated state.    Social Drivers of Corporate investment banker Strain: Low Risk  (05/14/2024)   Overall Financial Resource Strain (CARDIA)    Difficulty of Paying Living Expenses: Not hard at all  Food Insecurity: No Food Insecurity (05/14/2024)   Hunger Vital Sign    Worried About Running Out of Food in the Last Year: Never true    Ran Out of Food in the Last Year: Never true  Transportation Needs: No Transportation Needs (05/14/2024)   PRAPARE - Administrator, Civil Service (Medical): No    Lack of Transportation (Non-Medical): No  Physical Activity: Unknown (05/14/2024)   Exercise Vital Sign    Days of Exercise per Week: 2 days    Minutes of Exercise per Session: Patient declined  Stress: No Stress Concern Present (05/14/2024)   [employer] of Occupational Health  - Occupational Stress Questionnaire    Feeling of Stress: Not at all  Social Connections: Socially Integrated (05/14/2024)   Social Connection and Isolation Panel    Frequency of Communication with Friends and Family: More than three times a week    Frequency of Social Gatherings with Friends and Family: Twice a week    Attends Religious Services: More than 4 times per year    Active Member of [employer] or Organizations: Yes    Attends Engineer, structural: More than 4 times per year    Marital Status: Married     Family History: The patient's family history  includes Arthritis in his paternal grandmother; Breast cancer in his mother; Dementia in his mother; Diabetes in his daughter and maternal uncle; Emphysema in his maternal grandfather; Heart disease in his father and mother; Hepatitis C in his brother; Hypertension in his father and mother; Hypothyroidism in his mother; Stroke in his maternal grandmother and mother; Varicose Veins in his father. There is no history of Colon cancer, Esophageal cancer, Stomach cancer, or Rectal cancer.  ROS:   Please see the history of present illness.     All other systems reviewed and are negative.  EKGs/Labs/Other Studies Reviewed:    The following studies were reviewed today:   EKG:   06/20/2022: Normal sinus rhythm, rate 61, no ST abnormality 07/15/22: NSR, rate 59 12/20/22: Sinus bradycardia, rate 55, no ST abnormality 06/07/23: NSR, rate 61, no ST abnormalities 12/08/23: Sinus bradycardia, rate 57, no ST abnormalities 06/13/2024: Sinus bradycardia, rate 55, no ST abnormalities  Recent Labs: 01/25/2024: ALT 10; BUN 18; Creatinine, Ser 1.08; Hemoglobin 14.1; Platelets 122.0; Potassium 4.3; Sodium 140  Recent Lipid Panel    Component Value Date/Time   CHOL 92 01/25/2024 1521   TRIG 70.0 01/25/2024 1521   TRIG 58 09/01/2006 0749   HDL 39.40 01/25/2024 1521   CHOLHDL 2 01/25/2024 1521   VLDL 14.0 01/25/2024 1521   LDLCALC 38 01/25/2024 1521     Physical Exam:    VS:  There were no vitals taken for this visit.    Wt Readings from Last 3 Encounters:  05/15/24 197 lb 2 oz (89.4 kg)  02/05/24 196 lb (88.9 kg)  01/31/24 199 lb (90.3 kg)     GEN:  Well nourished, well developed in no acute distress HEENT: Normal NECK: No JVD; No carotid bruits CARDIAC: RRR, no murmurs, rubs, gallops RESPIRATORY:  Clear to auscultation without rales, wheezing or rhonchi  ABDOMEN: Soft, non-tender, non-distended MUSCULOSKELETAL:  No edema; No deformity  SKIN: Warm and dry NEUROLOGIC:  Alert and oriented x 3 PSYCHIATRIC:  Normal affect   ASSESSMENT:    1. Coronary artery disease of native artery of native heart with stable angina pectoris (HCC)   2. Paroxysmal atrial fibrillation (HCC)   3. OSA (obstructive sleep apnea)   4. Essential hypertension   5. Hyperlipidemia, unspecified hyperlipidemia type      PLAN:    CAD: Reported atypical chest pain.  Echocardiogram 05/02/2022 showed normal biventricular function, no significant valvular disease.  Coronary CTA on 07/11/2022 showed severe stenosis in mid to distal LAD (CT FFR 0.64), calcium  score 610 (72nd percentile).  LHC 07/22/2022 showed heavily calcified 85% mid LAD stenosis; medical management recommended and consider PCI if worsening symptoms. -Continue Eliquis  -Continue atorvastatin  -Continue Imdur  30 mg daily.  If chest pain despite antianginals, plan for PCI -As needed sublingual nitroglycerin   Paroxysmal atrial fibrillation: He was diagnosed with A-fib at ED visit 04/16/2022.  CHA2DS2-VASc score 3, was started on Eliquis  5 mg twice daily as well as diltiazem  30 mg as needed for palpitations/tachycardia.  Echo 05/02/22 showed normal biventricular function, no significant valvular disease. Zio patch x 7 days 06/20/2022 showed no A-fib, 49 episodes of SVT with longest lasting 12 seconds with average rate 132 bpm.   -Continue Eliquis  5 mg twice daily -Continue diltiazem  30 mg as needed.  No  scheduled AV nodal blockers given low resting heart rate -Discussed Kardia mobile for long term Afib monitoring -Is currently not using his CPAP, will refer to sleep medicine  Hypertension: On lisinopril  10 mg daily and Imdur   30 mg daily.  Appears controlled  Hyperlipidemia: On atorvastatin  20 mg daily.  LDL 38 on 01/2024  OSA: diagnosed in 09/2022, started on CPAP but having issues with it and has not been using recently.  Refer to sleep medicine as above   RTC in 6 months    Medication Adjustments/Labs and Tests Ordered: Current medicines are reviewed at length with the patient today.  Concerns regarding medicines are outlined above.  Orders Placed This Encounter  Procedures   Ambulatory referral to Pulmonology   EKG 12-Lead   No orders of the defined types were placed in this encounter.   Patient Instructions  Medication Instructions:  Continue current medication *If you need a refill on your cardiac medications before your next appointment, please call your pharmacy*  Lab Work: none If you have labs (blood work) drawn today and your tests are completely normal, you will receive your results only by: MyChart Message (if you have MyChart) OR A paper copy in the mail If you have any lab test that is abnormal or we need to change your treatment, we will call you to review the results.  Testing/Procedures:   Follow-Up: At [facility], you and your health needs are our priority.  As part of our continuing mission to provide you with exceptional heart care, our providers are all part of one team.  This team includes your primary Cardiologist (physician) and Advanced Practice Providers or APPs (Physician Assistants and Nurse Practitioners) who all work together to provide you with the care you need, when you need it.  Your next appointment:   6 month(s)  Provider:   Lonni LITTIE Nanas, MD    We recommend signing up for the patient portal called MyChart.   Sign up information is provided on this After Visit Summary.  MyChart is used to connect with patients for Virtual Visits (Telemedicine).  Patients are able to view lab/test results, encounter notes, upcoming appointments, etc.  Non-urgent messages can be sent to your provider as well.   To learn more about what you can do with MyChart, go to [email or web address].   Other Instructions Referral to [facility] Pulmonology       Signed, Lonni LITTIE Nanas, MD  06/13/2024 5:21 PM    [facility]

## 2024-06-11 DIAGNOSIS — H353211 Exudative age-related macular degeneration, right eye, with active choroidal neovascularization: Secondary | ICD-10-CM | POA: Diagnosis not present

## 2024-06-13 ENCOUNTER — Ambulatory Visit: Attending: Cardiology | Admitting: Cardiology

## 2024-06-13 DIAGNOSIS — G4733 Obstructive sleep apnea (adult) (pediatric): Secondary | ICD-10-CM

## 2024-06-13 DIAGNOSIS — I10 Essential (primary) hypertension: Secondary | ICD-10-CM

## 2024-06-13 DIAGNOSIS — E785 Hyperlipidemia, unspecified: Secondary | ICD-10-CM | POA: Diagnosis not present

## 2024-06-13 DIAGNOSIS — I25118 Atherosclerotic heart disease of native coronary artery with other forms of angina pectoris: Secondary | ICD-10-CM | POA: Diagnosis not present

## 2024-06-13 DIAGNOSIS — I480 Paroxysmal atrial fibrillation: Secondary | ICD-10-CM | POA: Diagnosis not present

## 2024-06-13 NOTE — Patient Instructions (Addendum)
 Medication Instructions:  Continue current medication *If you need a refill on your cardiac medications before your next appointment, please call your pharmacy*  Lab Work: none If you have labs (blood work) drawn today and your tests are completely normal, you will receive your results only by: MyChart Message (if you have MyChart) OR A paper copy in the mail If you have any lab test that is abnormal or we need to change your treatment, we will call you to review the results.  Testing/Procedures:   Follow-Up: At [facility], you and your health needs are our priority.  As part of our continuing mission to provide you with exceptional heart care, our providers are all part of one team.  This team includes your primary Cardiologist (physician) and Advanced Practice Providers or APPs (Physician Assistants and Nurse Practitioners) who all work together to provide you with the care you need, when you need it.  Your next appointment:   6 month(s)  Provider:   Lonni LITTIE Nanas, MD    We recommend signing up for the patient portal called MyChart.  Sign up information is provided on this After Visit Summary.  MyChart is used to connect with patients for Virtual Visits (Telemedicine).  Patients are able to view lab/test results, encounter notes, upcoming appointments, etc.  Non-urgent messages can be sent to your provider as well.   To learn more about what you can do with MyChart, go to [email or web address].   Other Instructions Referral to [facility] Pulmonology

## 2024-07-09 DIAGNOSIS — H353211 Exudative age-related macular degeneration, right eye, with active choroidal neovascularization: Secondary | ICD-10-CM | POA: Diagnosis not present

## 2024-07-12 ENCOUNTER — Ambulatory Visit (INDEPENDENT_AMBULATORY_CARE_PROVIDER_SITE_OTHER): Admitting: Adult Health

## 2024-07-12 ENCOUNTER — Encounter: Payer: Self-pay | Admitting: Adult Health

## 2024-07-12 VITALS — BP 124/72 | HR 73 | Temp 98.2°F | Ht 69.5 in | Wt 194.8 lb

## 2024-07-12 DIAGNOSIS — G4733 Obstructive sleep apnea (adult) (pediatric): Secondary | ICD-10-CM

## 2024-07-12 DIAGNOSIS — I10 Essential (primary) hypertension: Secondary | ICD-10-CM | POA: Diagnosis not present

## 2024-07-12 DIAGNOSIS — Z6828 Body mass index (BMI) 28.0-28.9, adult: Secondary | ICD-10-CM

## 2024-07-12 DIAGNOSIS — I2510 Atherosclerotic heart disease of native coronary artery without angina pectoris: Secondary | ICD-10-CM | POA: Diagnosis not present

## 2024-07-12 DIAGNOSIS — I480 Paroxysmal atrial fibrillation: Secondary | ICD-10-CM | POA: Diagnosis not present

## 2024-07-12 NOTE — Progress Notes (Signed)
 @Patient  ID: Edward Goodwin, male    DOB: 1951/07/20, 73 y.o.   MRN: [id number]  Chief Complaint  Patient presents with   Sleep Apnea    Apneic events during sleep    Referring provider: Katrinka Garnette KIDD, MD  HPI: 73 yo male seen for sleep consult 07/12/24 for sleep apnea   TEST/EVENTS :  Sleep study November 2023 and PSG mild sleep apnea with AHI at 9.6/hour and SpO2 low at 79%, REM sleep AHI 31.5/hour and supine sleep AHI 60.6/hour  07/12/2024  Discussed the use of AI scribe software for clinical note transcription with the patient, who gave verbal consent to proceed.  History of Present Illness Edward Goodwin is a 73 year old male with sleep apnea who presents for a sleep consult.  He has been experiencing difficulties with sleep apnea management. A sleep study on November 2023 showed mild sleep apnea with AHI at 9.6/hour and SpO2 low at 79%.  REM sleep AHI 31.5/hour and supine sleep AHI 60.6/hour.   December 22, 2022, CPAP titration study showed optimal control on CPAP 7 cm H2O.   He has been using a CPAP machine for about six months but discontinued its use due to claustrophobia and a deviated septum in his right nostril. He was only able to keep the machine on for eight hours on two occasions, with most nights limited to two or three hours. He also consulted with an orthodontist regarding a mandibular advancement device but decided against it due to concerns about discomfort and cost.  Patient says he has worked on weight loss over the last few years and is down about 70 pounds.  This has definitely helped his snoring but continues to have some light snoring.  Current weight is at 194 pounds with a BMI 28.  Typically goes to bed about 9 PM.  Gets up at 6 AM.  Epworth score is 2 out of 24.  Patient says he absolutely will not consider restarting CPAP therapy.  Says overall he really does not have much daytime sleepiness feel like that he sleeps okay.  His past medical history includes  high blood pressure and coronary artery disease, A-fib   He occasionally takes Tylenol  PM for sleep. He rarely naps during the day.  No history of congestive heart failure or stroke  Past Surgical History:  Procedure Laterality Date   ACNE CYST REMOVAL     CATARACT EXTRACTION W/ INTRAOCULAR LENS IMPLANT  10/14/2014   bilat   COLONOSCOPY     EYE SURGERY     FOOT SURGERY  11/14/1986   LEFT HEART CATH AND CORONARY ANGIOGRAPHY N/A 07/22/2022   Procedure: LEFT HEART CATH AND CORONARY ANGIOGRAPHY;  Surgeon: Claudene Victory ORN, MD;  Location: [facility];  Service: Cardiovascular;  Laterality: N/A;   SHOULDER ARTHROSCOPY  11/14/2005   Right   TONSILLECTOMY     SH: Patient is married has adult children..  Former smoker.  No alcohol or drug use.  Lives at home with his wife.  He is retired.   Allergies  Allergen Reactions   Sulfonamide Derivatives     Other Reaction(s): childhood    Immunization History  Administered Date(s) Administered   Fluad Quad(high Dose 65+) 08/27/2019, 09/19/2020, 08/17/2022   Fluad Trivalent(High Dose 65+) 08/16/2023   INFLUENZA, HIGH DOSE SEASONAL PF 08/17/2015, 08/17/2015, 09/01/2016, 08/22/2018, 08/27/2021   Influenza Split 10/03/2011   Influenza Whole 08/29/2012   Influenza,inj,Quad PF,6+ Mos 09/05/2013, 09/08/2014, 09/19/2020   Influenza-Unspecified 08/17/2017,  08/28/2018   Moderna Covid-19 Fall Seasonal Vaccine 55yrs & older 11/01/2023   PFIZER Comirnaty(Gray Top)Covid-19 Tri-Sucrose Vaccine 05/28/2021, 10/01/2022   PFIZER(Purple Top)SARS-COV-2 Vaccination 01/07/2020, 01/28/2020, 08/25/2020   PNEUMOCOCCAL CONJUGATE-20 04/28/2021   Pfizer Covid-19 Vaccine Bivalent Booster 36yrs & up 10/16/2021   Pfizer(Comirnaty)Fall Seasonal Vaccine 12 years and older 10/01/2022   Pneumococcal Conjugate-13 10/08/2013   Pneumococcal Polysaccharide-23 10/02/2009, 02/04/2015   Td 10/02/2009   Tdap 11/26/2019   Zoster Recombinant(Shingrix) 12/28/2018, 10/05/2019    Zoster, Live 12/04/2009    Past Medical History:  Diagnosis Date   Allergy    Cataract 10-21-14 and 10-28-14   Had cataract surgery   Chicken pox    Colitis, ulcerative (HCC)    Hypertension    Macular degeneration, bilateral 07/2012   areds 2    Psoriasis     Tobacco History: Social History   Tobacco Use  Smoking Status Former   Current packs/day: 0.00   Types: Cigarettes   Quit date: 10/23/1989   Years since quitting: 34.7  Smokeless Tobacco Never   Counseling given: Not Answered   Outpatient Medications Prior to Visit  Medication Sig Dispense Refill   acetaminophen  (TYLENOL ) 650 MG CR tablet Take 650 mg by mouth every 8 (eight) hours as needed for pain.     Ascorbic Acid (VITAMIN C PO) Take 1 tablet by mouth daily. 1000 mg     atorvastatin  (LIPITOR) 20 MG tablet TAKE 1 TABLET BY MOUTH EVERY DAY 90 tablet 3   Baclofen  5 MG TABS Take 1 tablet (5 mg total) by mouth 3 (three) times daily as needed. 30 tablet 0   bevacizumab  (AVASTIN ) 1.25 mg/0.1 mL SOLN 1.25 mg by Intravitreal route to Surgery.     clobetasol  ointment (TEMOVATE ) 0.05 % Apply 1 Application topically 2 (two) times daily as needed (irritation).     COMIRNATY syringe      diltiazem  (CARDIZEM ) 30 MG tablet Take one tablet by mouth every 4 hrs as needed, Heart rate needs to be above 100, top number blood pressure above 100 45 tablet 1   diphenhydramine-acetaminophen  (TYLENOL  PM) 25-500 MG TABS tablet Take 1 tablet by mouth at bedtime as needed (pain/sleep).     ELIQUIS  5 MG TABS tablet TAKE 1 TABLET BY MOUTH TWICE A DAY 60 tablet 5   gabapentin  (NEURONTIN ) 100 MG capsule Take 1 capsule (100 mg total) by mouth at bedtime. 90 capsule 3   isosorbide  mononitrate (IMDUR ) 30 MG 24 hr tablet TAKE 1 TABLET BY MOUTH EVERY DAY 90 tablet 2   L-Lysine 1000 MG TABS Take 1,000 mg by mouth daily.     lisinopril  (ZESTRIL ) 10 MG tablet Take 1 tablet (10 mg total) by mouth daily. 90 tablet 3   Multiple Vitamins-Minerals  (PRESERVISION AREDS 2) CAPS Take 1 capsule by mouth 2 (two) times daily before lunch and supper.     nitroGLYCERIN  (NITROSTAT ) 0.4 MG SL tablet PLACE 1 TABLET UNDER THE TONGUE EVERY 5 MINUTES AS NEEDED. 25 tablet 8   predniSONE  (DELTASONE ) 20 MG tablet Take 1 tablet (20 mg total) by mouth daily with breakfast. 7 tablet 0   Propylene Glycol (SYSTANE COMPLETE OP) Place 1 drop into both eyes 2 (two) times daily.     faricimab -svoa (VABYSMO ) 6 MG/0.05ML SOLN intravitreal injection 6 mg by Intravitreal route once. (Patient not taking: Reported on 07/12/2024)     No facility-administered medications prior to visit.     Review of Systems:   Constitutional:   No  weight loss, night  sweats,  Fevers, chills, fatigue, or  lassitude.  HEENT:   No headaches,  Difficulty swallowing,  Tooth/dental problems, or  Sore throat,                No sneezing, itching, ear ache, nasal congestion, post nasal drip,   CV:  No chest pain,  Orthopnea, PND, swelling in lower extremities, anasarca, dizziness, palpitations, syncope.   GI  No heartburn, indigestion, abdominal pain, nausea, vomiting, diarrhea, change in bowel habits, loss of appetite, bloody stools.   Resp: No shortness of breath with exertion or at rest.  No excess mucus, no productive cough,  No non-productive cough,  No coughing up of blood.  No change in color of mucus.  No wheezing.  No chest wall deformity  Skin: no rash or lesions.  GU: no dysuria, change in color of urine, no urgency or frequency.  No flank pain, no hematuria   MS:  No joint pain or swelling.  No decreased range of motion.  No back pain.    Physical Exam  BP 124/72   Pulse 73   Temp 98.2 F (36.8 C) (Oral)   Ht 5' 9.5 (1.765 m)   Wt 194 lb 12.8 oz (88.4 kg)   SpO2 99% Comment: room air  BMI 28.35 kg/m   GEN: A/Ox3; pleasant , NAD, well nourished    HEENT:  [place]/AT,   , NOSE-clear, THROAT-clear, no lesions, no postnasal drip or exudate noted.  Class III MP  airway  NECK:  Supple w/ fair ROM; no JVD; normal carotid impulses w/o bruits; no thyromegaly or nodules palpated; no lymphadenopathy.    RESP  Clear  P & A; w/o, wheezes/ rales/ or rhonchi. no accessory muscle use, no dullness to percussion  CARD:  RRR, no m/r/g, no peripheral edema, pulses intact, no cyanosis or clubbing.  GI:   Soft & nt; nml bowel sounds; no organomegaly or masses detected.   Musco: Warm bil, no deformities or joint swelling noted.   Neuro: alert, no focal deficits noted.    Skin: Warm, no lesions or rashes    Lab Results:  CBC      ProBNP No results found for: PROBNP  Imaging: No results found.  Administration History     None           No data to display          No results found for: NITRICOXIDE      Assessment & Plan:   No problem-specific Assessment & Plan notes found for this encounter.  Assessment and Plan Assessment & Plan Obstructive sleep apnea   Diagnosed with mild obstructive sleep apnea in November 2023, with 9.6 episodes per hour, worsening to 60 during supine sleep with associated Oxygen levels drop to 79%, indicating significant hypoxia. CPAP was not tolerated due to claustrophobia and a deviated septum. Oral appliance therapy was declined due to discomfort and cost concerns. The Inspire device is not covered by insurance due to mild AHI. Positional therapy is recommended to prevent supine sleep, which exacerbates apnea. Untreated sleep apnea increases the risk of cardiovascular issues such as AFib, stroke, and congestive heart failure, as well as cognitive and metabolic effects. Recommend positional therapy with the head of the bed elevated at 30 degrees using a wedge pillow or adjustable bed. Advise against sleep aids containing diphenhydramine, such as Tylenol  PM, as he may worsen sleep apnea. Discuss the potential for repeating a sleep study to reassess AHI and explore eligibility for  the Inspire device if  interested. Encourage weight loss to potentially improve apnea severity. He declines repeat sleep study at this time.  Says he is going to work on weight loss and positional sleep for now.  - discussed how weight can impact sleep and risk for sleep disordered breathing - discussed options to assist with weight loss: combination of diet modification, cardiovascular and strength training exercises   - had an extensive discussion regarding the adverse health consequences related to untreated sleep disordered breathing - specifically discussed the risks for hypertension, coronary artery disease, cardiac dysrhythmias, cerebrovascular disease, and diabetes - lifestyle modification discussed   - discussed how sleep disruption can increase risk of accidents, particularly when driving - safe driving practices were discussed     Overweight   BMI is 28, indicating overweight status. Weight loss could improve sleep apnea severity and overall health. Previous significant weight loss noted, which may have improved apnea symptoms. Encourage further weight loss of approximately 10 pounds to potentially improve sleep apnea and overall health. Advise maintaining an active lifestyle.  Hypertension, history of A-fib, coronary artery disease continue follow-up with cardiology with ongoing management.  Discussed the role of untreated sleep apnea with cardiovascular and cerebrovascular health  Plan  Patient Instructions  Positional sleep -sleep with head of bed at 30 degree incline, avoid sleeping on your back  Call back if you would like to set up home sleep study  Work on healthy weight loss  Do not drive if sleepy  Use caution with sedating medications.  Follow up As needed      Madelin Stank, NP 07/12/2024

## 2024-07-12 NOTE — Patient Instructions (Signed)
 Positional sleep -sleep with head of bed at 30 degree incline, avoid sleeping on your back  Call back if you would like to set up home sleep study  Work on healthy weight loss  Do not drive if sleepy  Use caution with sedating medications.  Follow up As needed

## 2024-07-29 ENCOUNTER — Encounter: Payer: Self-pay | Admitting: Family Medicine

## 2024-07-29 ENCOUNTER — Ambulatory Visit (INDEPENDENT_AMBULATORY_CARE_PROVIDER_SITE_OTHER): Admitting: Family Medicine

## 2024-07-29 VITALS — BP 100/58 | HR 64 | Temp 97.5°F | Ht 69.5 in | Wt 192.0 lb

## 2024-07-29 DIAGNOSIS — I2510 Atherosclerotic heart disease of native coronary artery without angina pectoris: Secondary | ICD-10-CM

## 2024-07-29 DIAGNOSIS — E785 Hyperlipidemia, unspecified: Secondary | ICD-10-CM | POA: Diagnosis not present

## 2024-07-29 DIAGNOSIS — I10 Essential (primary) hypertension: Secondary | ICD-10-CM

## 2024-07-29 DIAGNOSIS — Z23 Encounter for immunization: Secondary | ICD-10-CM | POA: Diagnosis not present

## 2024-07-29 DIAGNOSIS — I480 Paroxysmal atrial fibrillation: Secondary | ICD-10-CM

## 2024-07-29 NOTE — Patient Instructions (Addendum)
 blood pressure appears overtreated- we are going of the have him continue imdur  but cut lisinopril  in half for next 2 weeks then update me with how he is feeling and home blood pressures- mychart ok for this- if still occurring and blood pressure ok- may even stop lisinopril    Thanks for doing flu shot   Recommended follow up: Return in about 6 months (around 01/26/2025) for physical or sooner if needed.Schedule b4 you leave.

## 2024-07-29 NOTE — Progress Notes (Signed)
 Phone [phone] In person visit   Subjective:   Edward Goodwin is a 73 y.o. year old very pleasant male patient who presents for/with See problem oriented charting Chief Complaint  Patient presents with   Hyperlipidemia   Hypertension   Dizziness    After bending over and standing up and getting out of bed;    Past Medical History-  Patient Active Problem List   Diagnosis Date Noted   Paroxysmal atrial fibrillation (HCC) 04/21/2022    Priority: High   Secondary hypercoagulable state (HCC) 04/21/2022    Priority: High   Coronary artery disease 12/16/2021    Priority: High   Abdominal aortic aneurysm (AAA) without rupture, unspecified part (HCC)     Priority: High   Ulcerative colitis (HCC) 08/20/2007    Priority: High   Former smoker 04/19/2018    Priority: Medium    Hyperlipidemia 04/19/2018    Priority: Medium    Vertigo 12/19/2017    Priority: Medium    Gout 03/24/2014    Priority: Medium    Elevated PSA, less than 10 ng/ml 09/09/2013    Priority: Medium    Macular degeneration, bilateral 07/15/2012    Priority: Medium    Obesity (BMI 30.0-34.9) 12/02/2008    Priority: Medium    Essential hypertension 08/20/2007    Priority: Medium    PSORIASIS 08/20/2007    Priority: Medium    Onychomycosis 12/19/2017    Priority: Low   Trochanteric bursitis of left hip 03/31/2015    Priority: Low    Medications- reviewed and updated Current Outpatient Medications  Medication Sig Dispense Refill   acetaminophen  (TYLENOL ) 650 MG CR tablet Take 650 mg by mouth every 8 (eight) hours as needed for pain.     Ascorbic Acid (VITAMIN C PO) Take 1 tablet by mouth daily. 1000 mg     atorvastatin  (LIPITOR) 20 MG tablet TAKE 1 TABLET BY MOUTH EVERY DAY 90 tablet 3   bevacizumab  (AVASTIN ) 1.25 mg/0.1 mL SOLN 1.25 mg by Intravitreal route to Surgery.     clobetasol  ointment (TEMOVATE ) 0.05 % Apply 1 Application topically 2 (two) times daily as needed (irritation).     diltiazem   (CARDIZEM ) 30 MG tablet Take one tablet by mouth every 4 hrs as needed, Heart rate needs to be above 100, top number blood pressure above 100 45 tablet 1   diphenhydramine-acetaminophen  (TYLENOL  PM) 25-500 MG TABS tablet Take 1 tablet by mouth at bedtime as needed (pain/sleep).     ELIQUIS  5 MG TABS tablet TAKE 1 TABLET BY MOUTH TWICE A DAY 60 tablet 5   isosorbide  mononitrate (IMDUR ) 30 MG 24 hr tablet TAKE 1 TABLET BY MOUTH EVERY DAY 90 tablet 2   L-Lysine 1000 MG TABS Take 1,000 mg by mouth daily.     lisinopril  (ZESTRIL ) 10 MG tablet Take 1 tablet (10 mg total) by mouth daily. 90 tablet 3   Multiple Vitamins-Minerals (PRESERVISION AREDS 2) CAPS Take 1 capsule by mouth 2 (two) times daily before lunch and supper.     nitroGLYCERIN  (NITROSTAT ) 0.4 MG SL tablet PLACE 1 TABLET UNDER THE TONGUE EVERY 5 MINUTES AS NEEDED. 25 tablet 8   Propylene Glycol (SYSTANE COMPLETE OP) Place 1 drop into both eyes 2 (two) times daily.     faricimab -svoa (VABYSMO ) 6 MG/0.05ML SOLN intravitreal injection 6 mg by Intravitreal route once. (Patient not taking: Reported on 07/29/2024)     No current facility-administered medications for this visit.     Objective:  BP ROLLEN)  100/58 (BP Location: Left Arm, Patient Position: Sitting, Cuff Size: Normal)   Pulse 64   Temp (!) 97.5 F (36.4 C) (Temporal)   Ht 5' 9.5 (1.765 m)   Wt 192 lb (87.1 kg)   SpO2 94%   BMI 27.95 kg/m  Gen: NAD, resting comfortably CV: RRR no murmurs rubs or gallops Lungs: CTAB no crackles, wheeze, rhonchi Ext: no edema Skin: warm, dry     Assessment and Plan   # Atrial fibrillation #Secondary hypercoagulable state S: Rate controlled with diltiazem  30 mg only as needed for elevated heart rate/palpitations/heart racing- not needing -new diagnosis 04/16/22 in ER Anticoagulated with Eliquis  5 mg twice daily  Chadsvasc score of 3 Patient is  followed by cardiology: Atrial fibrillation clinic A/P: appropriately anticoagulated and rate  controlled- continue current medicine    #hypertension S: medication: Lisinopril  10 mg once daily, imdur  30 mg -289 max weight at prior practice- has done an incredible job maintaining weight loss Home readings #s: a lot of readings at home into the 100s and a few days ago even into high 80's and 90s -Reports lightheadedness with standing recently particularly in the morning (does ok if sits on side of bed but if changes from lying to standing quickly feels dizzy- also happens with golf )-blood pressure has looked excellent lately and on lower side today initially  BP Readings from Last 3 Encounters:  07/29/24 (!) 100/58  07/12/24 124/72  05/15/24 120/60   A/P: blood pressure appears overtreated- we are going of the have him continue imdur  but cut lisinopril  in half for next 2 weeks then update me with how he is feeling and home blood pressures- mychart ok for this- if still occurring and blood pressure ok- may even stop lisinopril    #CAD- LHC 07/22/2022 showed heavily calcified 85% mid LAD stenosis; medical management recommended and consider PCI worsening symptoms.  #hyperlipidemia  S: Medication:Atorvastatin  20 mg once daily, imdur  30 mg, eliquis  - intermittent chest pain but not worsening. Not with exercise Lab Results  Component Value Date   CHOL 92 01/25/2024   HDL 39.40 01/25/2024   LDLCALC 38 01/25/2024   TRIG 70.0 01/25/2024   CHOLHDL 2 01/25/2024  A/P: CAD stable- chest pain by cardiology listed as atypical- continue current medications Lipids look fantastic- continue current medications   #Vertigo/BPPV-sparing issues - still has meclizine  25 mg on hand if needed -not needing  #macular degeneration- close follow up with optho- 2024 on eylea  - later Vabysmo  (now apparently on a slightly different medicine) -still dry in left eye. Q4 week injections- holding steady  #mild thrombocytopenia- chronic stable, other cell lines ok- trend at least annually - noted in march. Labs next  visit Lab Results  Component Value Date   WBC 4.8 01/25/2024   HGB 14.1 01/25/2024   HCT 41.8 01/25/2024   MCV 92.9 01/25/2024   PLT 122.0 (L) 01/25/2024    # OSA-trial of CPAP ordered February 2024 per Dr. Burnard of cardiology - started June 08 2023 but trouble tolerating- not sure if he will persist. Sees pulmonary as well- they are trying position changes and weight loss (already has done great job). See note 07/29/24  -holding off on repeat study to consider inspire -trying a different pillow that helps reflux and keeps him on his side that he wants to trial- does ok unless on back  # Low back pain-treated by Dr. Kennyth with course of prednisone  July 2025 and has baclofen  available . Back feels better but  both feet just feel off- toes feel almost asleep. Could have irritated nerve root or idiopathic neuropathy- wants to hold off on further evaluation   #Slight lip quivering intermittent- not aware of it. We mentioned possibility of parkinson relation but we will monitor- tried decaf for 3 weeks- hard to tell if difference because so infrequent- we will monitor. Only present a month or two  Recommended follow up: Return in about 6 months (around 01/26/2025) for physical or sooner if needed.Schedule b4 you leave. Future Appointments  Date Time Provider Department Center  01/27/2025  8:20 AM Katrinka Garnette KIDD, MD [facility]  02/11/2025  1:40 PM [facility] ANNUAL WELLNESS VISIT 1 [facility]    Lab/Order associations:   ICD-10-CM   1. Essential hypertension  I10     2. Hyperlipidemia, unspecified hyperlipidemia type  E78.5     3. Paroxysmal atrial fibrillation (HCC)  I48.0     4. Coronary artery disease involving native coronary artery of native heart without angina pectoris  I25.10     5. Need for influenza vaccination  Z23 Flu vaccine HIGH DOSE PF(Fluzone Trivalent)      No orders of the defined types were placed in this encounter.   Return precautions  advised.  Garnette Katrinka, MD

## 2024-08-06 DIAGNOSIS — H353211 Exudative age-related macular degeneration, right eye, with active choroidal neovascularization: Secondary | ICD-10-CM | POA: Diagnosis not present

## 2024-08-28 ENCOUNTER — Telehealth: Payer: Self-pay | Admitting: Family Medicine

## 2024-08-28 NOTE — Telephone Encounter (Signed)
 Patient will keep taking the 5mg  he does not need anymore pills at this time.

## 2024-08-29 NOTE — Telephone Encounter (Signed)
 Thank you this is in response to well-controlled blood pressure readings on the lower dose lisinopril  5 mg down from 10 mg

## 2024-09-10 DIAGNOSIS — H353211 Exudative age-related macular degeneration, right eye, with active choroidal neovascularization: Secondary | ICD-10-CM | POA: Diagnosis not present

## 2024-10-06 ENCOUNTER — Other Ambulatory Visit: Payer: Self-pay | Admitting: Cardiology

## 2024-10-06 DIAGNOSIS — I480 Paroxysmal atrial fibrillation: Secondary | ICD-10-CM

## 2024-10-08 DIAGNOSIS — H43813 Vitreous degeneration, bilateral: Secondary | ICD-10-CM | POA: Diagnosis not present

## 2024-10-08 DIAGNOSIS — Z961 Presence of intraocular lens: Secondary | ICD-10-CM | POA: Diagnosis not present

## 2024-10-08 DIAGNOSIS — H353122 Nonexudative age-related macular degeneration, left eye, intermediate dry stage: Secondary | ICD-10-CM | POA: Diagnosis not present

## 2024-10-08 DIAGNOSIS — H353211 Exudative age-related macular degeneration, right eye, with active choroidal neovascularization: Secondary | ICD-10-CM | POA: Diagnosis not present

## 2024-10-08 DIAGNOSIS — H35033 Hypertensive retinopathy, bilateral: Secondary | ICD-10-CM | POA: Diagnosis not present

## 2024-10-08 NOTE — Telephone Encounter (Signed)
 Pt last saw Dr Kate 06/13/24, last labs 01/25/24 Creat 1.08, age 73, weight 87.1kg, based on specified criteria pt is on appropriate dosage of Eliquis  5mg  BID for afib.  Will refill rx.

## 2024-10-17 ENCOUNTER — Encounter: Payer: Self-pay | Admitting: Family Medicine

## 2024-11-16 ENCOUNTER — Other Ambulatory Visit: Payer: Self-pay | Admitting: Cardiology

## 2024-12-01 ENCOUNTER — Encounter: Payer: Self-pay | Admitting: Family Medicine

## 2024-12-05 ENCOUNTER — Other Ambulatory Visit: Payer: Self-pay | Admitting: Cardiology

## 2025-01-09 ENCOUNTER — Ambulatory Visit: Admitting: Cardiology

## 2025-01-27 ENCOUNTER — Encounter: Admitting: Family Medicine

## 2025-02-11 ENCOUNTER — Ambulatory Visit
